# Patient Record
Sex: Female | Born: 1970 | Race: White | Hispanic: No | Marital: Married | Smoking: Never smoker
Health system: Southern US, Community
[De-identification: ages and names within clinical notes are randomized; demographics above are authoritative.]

## PROBLEM LIST (undated history)

## (undated) DIAGNOSIS — E119 Type 2 diabetes mellitus without complications: Secondary | ICD-10-CM

## (undated) DIAGNOSIS — M2550 Pain in unspecified joint: Secondary | ICD-10-CM

## (undated) DIAGNOSIS — C801 Malignant (primary) neoplasm, unspecified: Secondary | ICD-10-CM

## (undated) DIAGNOSIS — E739 Lactose intolerance, unspecified: Secondary | ICD-10-CM

## (undated) DIAGNOSIS — M1990 Unspecified osteoarthritis, unspecified site: Secondary | ICD-10-CM

## (undated) DIAGNOSIS — I639 Cerebral infarction, unspecified: Secondary | ICD-10-CM

## (undated) DIAGNOSIS — F329 Major depressive disorder, single episode, unspecified: Secondary | ICD-10-CM

## (undated) DIAGNOSIS — M329 Systemic lupus erythematosus, unspecified: Secondary | ICD-10-CM

## (undated) DIAGNOSIS — R600 Localized edema: Secondary | ICD-10-CM

## (undated) DIAGNOSIS — F32A Depression, unspecified: Secondary | ICD-10-CM

## (undated) DIAGNOSIS — F419 Anxiety disorder, unspecified: Secondary | ICD-10-CM

## (undated) DIAGNOSIS — G4730 Sleep apnea, unspecified: Secondary | ICD-10-CM

## (undated) DIAGNOSIS — I10 Essential (primary) hypertension: Secondary | ICD-10-CM

## (undated) DIAGNOSIS — D649 Anemia, unspecified: Secondary | ICD-10-CM

## (undated) DIAGNOSIS — IMO0002 Reserved for concepts with insufficient information to code with codable children: Secondary | ICD-10-CM

## (undated) DIAGNOSIS — K219 Gastro-esophageal reflux disease without esophagitis: Secondary | ICD-10-CM

## (undated) DIAGNOSIS — K5900 Constipation, unspecified: Secondary | ICD-10-CM

## (undated) HISTORY — PX: ABDOMINAL HYSTERECTOMY: SHX81

## (undated) HISTORY — DX: Lactose intolerance, unspecified: E73.9

## (undated) HISTORY — DX: Gastro-esophageal reflux disease without esophagitis: K21.9

## (undated) HISTORY — PX: WISDOM TOOTH EXTRACTION: SHX21

## (undated) HISTORY — PX: CHOLECYSTECTOMY: SHX55

## (undated) HISTORY — DX: Sleep apnea, unspecified: G47.30

## (undated) HISTORY — DX: Anemia, unspecified: D64.9

## (undated) HISTORY — DX: Localized edema: R60.0

## (undated) HISTORY — DX: Pain in unspecified joint: M25.50

## (undated) HISTORY — DX: Unspecified osteoarthritis, unspecified site: M19.90

## (undated) HISTORY — DX: Depression, unspecified: F32.A

## (undated) HISTORY — DX: Constipation, unspecified: K59.00

---
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
## 2005-01-01 ENCOUNTER — Emergency Department (HOSPITAL_COMMUNITY): Admission: EM | Admit: 2005-01-01 | Discharge: 2005-01-01 | Payer: Self-pay | Admitting: Emergency Medicine

## 2005-01-01 ENCOUNTER — Ambulatory Visit: Payer: Self-pay | Admitting: Internal Medicine

## 2005-01-01 ENCOUNTER — Encounter: Payer: Self-pay | Admitting: Cardiology

## 2005-10-26 ENCOUNTER — Ambulatory Visit: Payer: Self-pay

## 2007-04-08 ENCOUNTER — Other Ambulatory Visit: Payer: Self-pay

## 2007-04-08 ENCOUNTER — Emergency Department: Payer: Self-pay | Admitting: Emergency Medicine

## 2007-05-19 ENCOUNTER — Encounter (INDEPENDENT_AMBULATORY_CARE_PROVIDER_SITE_OTHER): Payer: Self-pay | Admitting: Gynecology

## 2007-05-19 ENCOUNTER — Ambulatory Visit: Payer: Self-pay | Admitting: Gynecology

## 2008-04-25 ENCOUNTER — Emergency Department: Payer: Self-pay | Admitting: Emergency Medicine

## 2010-06-26 ENCOUNTER — Emergency Department (HOSPITAL_COMMUNITY): Admission: EM | Admit: 2010-06-26 | Discharge: 2010-06-26 | Payer: Self-pay | Admitting: Emergency Medicine

## 2011-05-04 NOTE — Consult Note (Signed)
[name], [name]                 ACCOUNT NO.:  [id number]   MEDICAL RECORD NO.:  [id number]          PATIENT TYPE:  EMS   LOCATION:  MAJO                         FACILITY:  [facility]   PHYSICIAN:  Theodore Demark, P.A. LHCDATE OF BIRTH:  [birth year]   DATE OF CONSULTATION:  01/01/2005  DATE OF DISCHARGE:                                   CONSULTATION   PRIMARY CARE PHYSICIAN:  Dr. Welton Flakes at [facility].   PRIMARY CARDIOLOGIST:  Arvilla Meres, M.D.   CHIEF COMPLAINT:  Palpitations, fatigue, chest pain.   HISTORY OF PRESENT ILLNESS:  [name] is a 40 year old female with  approximately one-year history of palpitations.  They are intermittent.  They have been occurring more frequently recently.  She is aware of them  during the day and they have awakened her twice at night.  Today she felt  sluggish and tired secondary to palpitations.  By pulse ox her heart rate  was decreasing into the 30s at times.  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   Bigeminy was seen on telemetry.  [name] denies long runs of palpitations  but says that very frequent skipping causes symptoms.  She has no history of  syncope or presyncope.  She gets chest pain with palpitations that are 3 or  4/10.  Chest pain does not change with position, or movement or palpation of  her chest.  She did not have chest pain when the palpitations initially  started but the chest pain started with them about three days ago.  The  chest pain is intermittent but she is having it at the time of exam.  She  denies shortness of breath, nausea, vomiting or diaphoresis.   PAST MEDICAL HISTORY:  She had borderline gestational diabetes.  She also  had a systolic murmur during pregnancy but says no one has mentioned it to  her since then.  She has no history of hypertension or hyperlipidemia.  History of anemia about five years.   PAST SURGICAL HISTORY:   She is status post cholecystectomy, cesarean section  and tubal ligation.   MEDICATIONS:  She takes Prilosec OTC.  She has not recently taken any other  over-the-counter medications and is on no prescriptions.  Four years ago she  took phentermine to lose weight.  She did lose about 45 pounds but regained  it.   ALLERGIES:  No known drug allergies.   SOCIAL HISTORY:  She lives in [place] with her husband.  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  She drinks about three 8 ounce  Pepsis a day.  She denies alcohol or drug abuse.   FAMILY HISTORY:  Her mother is alive at age 2.  Her father is alive at age  59. Neither one has heart disease.  She has no siblings with heart disease,  heart rhythm problems or any other difficulties.  Her aunt is alive at age  78 and has had multiple cardiac surgeries since she was  a child, most likely  secondary to some sort of birth defects.   REVIEW OF SYSTEMS:  Significant for mild chronic dyspnea on exertion that  has not recently changed.  She has not been ill recently, no fevers or  chills.  She complains of some symptoms of depression and anxiety and was on  Wellbutrin at one time for that but this has been stopped.  She has  significant arthralgias in both of her feet but has not sought treatment for  this.  She has occasional reflux symptoms for which she takes Prilosec OTC.   PHYSICAL EXAMINATION:  VITAL SIGNS:  Temperature is 97.9, blood pressure  135/90, heart rate 82, respiratory rate 18, oxygen saturations 96% on room  air.  GENERAL:  She is a well developed, slightly obese white female in no acute  distress.  HEENT:  Head is normocephalic and atraumatic.  Pupils equal, round and  reactive to light and accommodation.  Extraocular movements are intact.  Sclerae are clear.  Nares without discharge.  NECK:  Supple.  There is no lymphadenopathy, bruit or jugular venous  distention.  There is question of mild thyromegaly  (ultrasound pending).  CARDIOVASCULAR:  Heart is regular in rate and rhythm with an S1, S2 and a  1/2 systolic ejection murmur at the left lower sternal border.  Her distal  pulses are 2+ and no femoral bruits are appreciated.  LUNGS:  Clear to auscultation bilaterally.  SKIN:  No rashes or lesions are noted.  ABDOMEN:  Soft and nontender with active bowel sounds and no  hepatosplenomegaly by exam.  EXTREMITIES:  There is no cyanosis, clubbing or edema.  MUSCULOSKELETAL:  There is no joint deformity or effusions and no spine or  CVA tenderness.  NEURO:  She is alert and oriented with cranial nerves 2 through 12 grossly  intact.   EKG:  Sinus rhythm, rate 69 with bigeminy PVCs.  Laboratory values are  pending.   ASSESSMENT/PLAN:  Palpitations. This is a 40 year old female with  progressive symptomatic PVCs.  Suspect these are benign.  Will check  electrolytes, TSH as well as echo and a thyroid ultrasound. If these are  normal, she can be discharged home with a 48 hour Holter and a prescription  for Toprol XL 25 mg.  She needs follow-up visit in the office for an  exercise Cardiolite.       RB/MEDQ  D:  01/01/2005  T:  01/01/2005  Job:  [id number]

## 2012-05-01 ENCOUNTER — Ambulatory Visit: Payer: Self-pay | Admitting: Family Medicine

## 2012-05-13 ENCOUNTER — Ambulatory Visit: Payer: Self-pay | Admitting: Family Medicine

## 2012-10-26 ENCOUNTER — Emergency Department (HOSPITAL_COMMUNITY): Payer: Self-pay

## 2012-10-26 ENCOUNTER — Encounter (HOSPITAL_COMMUNITY): Payer: Self-pay

## 2012-10-26 ENCOUNTER — Emergency Department (HOSPITAL_COMMUNITY)
Admission: EM | Admit: 2012-10-26 | Discharge: 2012-10-26 | Disposition: A | Discharge status: Home / Self Care | Payer: Self-pay | Attending: Emergency Medicine | Admitting: Emergency Medicine

## 2012-10-26 DIAGNOSIS — Z90710 Acquired absence of both cervix and uterus: Secondary | ICD-10-CM | POA: Insufficient documentation

## 2012-10-26 DIAGNOSIS — Z9889 Other specified postprocedural states: Secondary | ICD-10-CM | POA: Insufficient documentation

## 2012-10-26 DIAGNOSIS — R1012 Left upper quadrant pain: Secondary | ICD-10-CM | POA: Insufficient documentation

## 2012-10-26 DIAGNOSIS — M329 Systemic lupus erythematosus, unspecified: Secondary | ICD-10-CM | POA: Insufficient documentation

## 2012-10-26 DIAGNOSIS — R109 Unspecified abdominal pain: Secondary | ICD-10-CM

## 2012-10-26 DIAGNOSIS — Z79899 Other long term (current) drug therapy: Secondary | ICD-10-CM | POA: Insufficient documentation

## 2012-10-26 HISTORY — DX: Reserved for concepts with insufficient information to code with codable children: IMO0002

## 2012-10-26 HISTORY — DX: Systemic lupus erythematosus, unspecified: M32.9

## 2012-10-26 LAB — URINALYSIS, ROUTINE W REFLEX MICROSCOPIC
Glucose, UA: NEGATIVE mg/dL
Leukocytes, UA: NEGATIVE
pH: 5.5 (ref 5.0–8.0)

## 2012-10-26 LAB — POCT I-STAT, CHEM 8
BUN: 15 mg/dL (ref 6–23)
Chloride: 103 mEq/L (ref 96–112)
Potassium: 3.6 mEq/L (ref 3.5–5.1)
Sodium: 140 mEq/L (ref 135–145)
TCO2: 25 mmol/L (ref 0–100)

## 2012-10-26 MED ORDER — IBUPROFEN 400 MG PO TABS
600.0000 mg | ORAL_TABLET | Freq: Once | ORAL | Status: AC
  Administered 2012-10-26: 600 mg via ORAL
  Filled 2012-10-26: qty 1

## 2012-10-26 MED ORDER — IBUPROFEN 600 MG PO TABS: 600.0000 mg | ORAL_TABLET | Freq: Three times a day (TID) | ORAL | Status: DC | PRN

## 2012-10-26 NOTE — ED Provider Notes (Signed)
History     CSN: [id number]  Arrival date & time 10/26/12  4098   First MD Initiated Contact with Patient 10/26/12 [phone]      Chief Complaint  Patient presents with  . Abdominal Pain     The history is provided by the patient and medical records.   patient reports awakening with intermittent sharp left upper quadrant abdominal pain.  No nausea vomiting or diarrhea.  No melena or hematochezia.  She denies dysuria or urinary frequency.  She's never had these symptoms before.  She states her symptoms are intermittent.  History kidney stones.  She does report laughing a lot yesterday and does report some tenderness on the left lateral chest wall and left lateral lower rib.  She denies cough or shortness of breath.  No fevers or chills.  She's had an abdominal hysterectomy and a cholecystectomy.  She otherwise has a history of lupus and no other medical problems.  No history of PE or DVT  Past Medical History  Diagnosis Date  . Lupus     Past Surgical History  Procedure Date  . Abdominal hysterectomy   . Cholecystectomy   . Wisdom tooth extraction     No family history on file.  History  Substance Use Topics  . Smoking status: Never Smoker   . Smokeless tobacco: Not on file  . Alcohol Use: Yes    OB History    Grav Para Term Preterm Abortions TAB SAB Ect Mult Living                  Review of Systems  Gastrointestinal: Positive for abdominal pain.  All other systems reviewed and are negative.    Allergies  Review of patient's allergies indicates no known allergies.  Home Medications   Current Outpatient Rx  Name  Route  Sig  Dispense  Refill  . ESOMEPRAZOLE MAGNESIUM 40 MG PO CPDR   Oral   Take 40 mg by mouth daily before breakfast.           BP 149/94  Pulse 77  Temp 98.7 F (37.1 C) (Oral)  Resp 18  SpO2 97%  Physical Exam  Nursing note and vitals reviewed. Constitutional: She is oriented to person, place, and time. She appears well-developed and  well-nourished. No distress.  HENT:  Head: Normocephalic and atraumatic.  Eyes: EOM are normal.  Neck: Normal range of motion.  Cardiovascular: Normal rate, regular rhythm and normal heart sounds.   Pulmonary/Chest: Effort normal and breath sounds normal.       Mild tenderness of left lateral chest wall as well as left upper quadrant abdomen.  Abdominal: Soft. She exhibits no distension.       Mild tenderness of the left upper quadrant without guarding or rebound  Genitourinary:       Mild left CVA tenderness  Musculoskeletal: Normal range of motion.  Neurological: She is alert and oriented to person, place, and time.  Skin: Skin is warm and dry.  Psychiatric: She has a normal mood and affect. Judgment normal.    ED Course  Procedures (including critical care time)  Labs Reviewed  URINALYSIS, ROUTINE W REFLEX MICROSCOPIC - Abnormal; Notable for the following:    APPearance HAZY (*)     All other components within normal limits  POCT I-STAT, CHEM 8 - Abnormal; Notable for the following:    Glucose, Bld 109 (*)     All other components within normal limits   Ct Abdomen Pelvis Wo  Contrast  10/26/2012  *RADIOLOGY REPORT*  Clinical Data: Left upper quadrant pain  CT ABDOMEN AND PELVIS WITHOUT CONTRAST  Technique:  Multidetector CT imaging of the abdomen and pelvis was performed following the standard protocol without intravenous contrast.  Comparison: None.  Findings: Sagittal images of the spine shows significant disc space flattening at L4-L5 level.  Lung bases are unremarkable. Unenhanced liver shows no biliary ductal dilatation.  Small hiatal hernia measures 3.9 x 2.9 cm.  Status post cholecystectomy.  Unenhanced pancreas, spleen and adrenal glands are unremarkable.  Tiny accessory splenule.  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  No hydronephrosis or hydroureter.  No nephrolithiasis.  No calcified ureteral calculi are noted.  No urinary bladder calculi are noted.  The patient is  status post hysterectomy.  No small bowel obstruction.  No ascites or free air.  There is no pericecal inflammation.  No adenopathy.  Normal appendix is partially visualized in axial image 57.  No destructive bony lesions are noted within pelvis.  IMPRESSION:  1.  No nephrolithiasis. No hydronephrosis or hydroureter. 2. Horseshoe kidney.   3.  Small hiatal hernia. 4.  No calcified ureteral calculi are noted. 5.  Status post cholecystectomy.  Status post hysterectomy.   Original Report Authenticated By: Natasha Mead, M.D.    I personally reviewed the imaging tests through PACS system I reviewed available ER/hospitalization records through the EMR   1. Flank pain       MDM  10:38 AM The patient feels somewhat better at this time.  Urine normal.  CT scan without evidence of ureterolithiasis or alternative diagnosis for her left flank pain.  The fact that is worse with movement and palpation makes me think it may be musculoskeletal pain.  Home with anti-inflammatories.  Vital signs normal.  Doubt pulmonary embolism.        Lyanne Co, MD 10/26/12 [phone]

## 2012-10-26 NOTE — ED Notes (Signed)
Pt undressed, in gown, on continuous pulse oximetry and blood pressure cuff; warm blankets given 

## 2012-10-26 NOTE — ED Notes (Signed)
Pt. Woke up with  Sharp lt. Upper abdominal pain.  Denies any injuries.  Denies any n/v/d

## 2012-12-17 LAB — HM MAMMOGRAPHY: HM MAMMO: NORMAL

## 2012-12-17 LAB — HM PAP SMEAR: HM Pap smear: NORMAL

## 2013-08-05 ENCOUNTER — Ambulatory Visit: Payer: Self-pay

## 2013-11-16 ENCOUNTER — Emergency Department (HOSPITAL_COMMUNITY): Payer: Self-pay

## 2013-11-16 ENCOUNTER — Emergency Department (HOSPITAL_COMMUNITY)
Admission: EM | Admit: 2013-11-16 | Discharge: 2013-11-16 | Disposition: A | Discharge status: Home / Self Care | Payer: Self-pay | Attending: Emergency Medicine | Admitting: Emergency Medicine

## 2013-11-16 ENCOUNTER — Encounter (HOSPITAL_COMMUNITY): Payer: Self-pay | Admitting: Emergency Medicine

## 2013-11-16 DIAGNOSIS — M546 Pain in thoracic spine: Secondary | ICD-10-CM | POA: Insufficient documentation

## 2013-11-16 DIAGNOSIS — M25519 Pain in unspecified shoulder: Secondary | ICD-10-CM | POA: Insufficient documentation

## 2013-11-16 DIAGNOSIS — K219 Gastro-esophageal reflux disease without esophagitis: Secondary | ICD-10-CM | POA: Insufficient documentation

## 2013-11-16 DIAGNOSIS — Z8739 Personal history of other diseases of the musculoskeletal system and connective tissue: Secondary | ICD-10-CM | POA: Insufficient documentation

## 2013-11-16 DIAGNOSIS — R110 Nausea: Secondary | ICD-10-CM | POA: Insufficient documentation

## 2013-11-16 DIAGNOSIS — I10 Essential (primary) hypertension: Secondary | ICD-10-CM | POA: Insufficient documentation

## 2013-11-16 DIAGNOSIS — R609 Edema, unspecified: Secondary | ICD-10-CM | POA: Insufficient documentation

## 2013-11-16 DIAGNOSIS — Z79899 Other long term (current) drug therapy: Secondary | ICD-10-CM | POA: Insufficient documentation

## 2013-11-16 DIAGNOSIS — F411 Generalized anxiety disorder: Secondary | ICD-10-CM | POA: Insufficient documentation

## 2013-11-16 DIAGNOSIS — M549 Dorsalgia, unspecified: Secondary | ICD-10-CM

## 2013-11-16 LAB — COMPREHENSIVE METABOLIC PANEL
ALT: 19 U/L (ref 0–35)
AST: 17 U/L (ref 0–37)
Albumin: 3.7 g/dL (ref 3.5–5.2)
Alkaline Phosphatase: 58 U/L (ref 39–117)
BUN: 15 mg/dL (ref 6–23)
CO2: 26 mEq/L (ref 19–32)
Calcium: 9.1 mg/dL (ref 8.4–10.5)
Chloride: 98 mEq/L (ref 96–112)
Creatinine, Ser: 0.71 mg/dL (ref 0.50–1.10)
GFR calc Af Amer: >90 mL/min (ref >90)
GFR calc non Af Amer: >90 mL/min (ref >90)
Glucose, Bld: 117 mg/dL — ABNORMAL HIGH (ref 70–99)
Potassium: 3.3 mEq/L — ABNORMAL LOW (ref 3.5–5.1)
Sodium: 137 mEq/L (ref 135–145)
Total Bilirubin: 0.3 mg/dL (ref 0.3–1.2)
Total Protein: 7.4 g/dL (ref 6.0–8.3)

## 2013-11-16 LAB — CBC WITH DIFFERENTIAL/PLATELET
Eosinophils Relative: 2 % (ref 0–5)
HCT: 37.5 % (ref 36.0–46.0)
Lymphocytes Relative: 27 % (ref 12–46)
Lymphs Abs: 2.2 10*3/uL (ref 0.7–4.0)
MCV: 81.7 fL (ref 78.0–100.0)
Platelets: 236 10*3/uL (ref 150–400)
RBC: 4.59 MIL/uL (ref 3.87–5.11)
WBC: 8.2 10*3/uL (ref 4.0–10.5)

## 2013-11-16 LAB — PRO B NATRIURETIC PEPTIDE: Pro B Natriuretic peptide (BNP): 7.5 pg/mL (ref 0–125)

## 2013-11-16 LAB — POCT I-STAT TROPONIN I

## 2013-11-16 MED ORDER — POTASSIUM CHLORIDE CRYS ER 20 MEQ PO TBCR
40.0000 meq | EXTENDED_RELEASE_TABLET | Freq: Once | ORAL | Status: AC
  Administered 2013-11-16: 40 meq via ORAL
  Filled 2013-11-16: qty 2

## 2013-11-16 NOTE — ED Notes (Signed)
Pt states that she started having pain a few hours ago at home. Pt states that the pain is between her shoulder blades and wraps around to her chest intermittantly. Pt states 5/10 now but earlier was a 10/10. Pt nauseated and burping, gallbladder removed. Pt suppose to have ultrasound on heart due to fluid build up but miss appointment.

## 2013-11-16 NOTE — ED Notes (Signed)
Dr. Wickline at the bedside.  

## 2013-11-16 NOTE — ED Notes (Signed)
Pt states that her pain is not longer in her back, but more in her left upper chest near clavicle area. Pt states uncomfortable, pt offered pain medication but does not want any right now. Pt told to call if she changes her mind.

## 2013-11-16 NOTE — ED Notes (Signed)
Cardiac marker due at [facility]

## 2013-11-16 NOTE — ED Provider Notes (Signed)
CSN: [id number]     Arrival date & time 11/16/13  2725 History   First MD Initiated Contact with Patient 11/16/13 [phone]     Chief Complaint  Patient presents with  . Back Pain   (Consider location/radiation/quality/duration/timing/severity/associated sxs/prior Treatment) HPI 42 year old female presents to the emergency apartment with complaint of pain in between the shoulder blades this morning around 3 AM.  Patient reports waking with pain down her right arm, and some numbness around 3 AM.  Patient then had onset of back pain that lasted about an hour.  Pain was sharp.  With that she became nauseated and felt hot.  Patient reports over the last few weeks to months she has had shortness of breath waking her up in the night with cough.  She has had swelling in her lower extremities.  Her doctor recently increased her hydrochlorothiazide from 12.5-25 last week.  Patient reports that she had an echo scheduled, but has not yet been able to have it completed.  PMH of lupus, htn, GERD.  No personal h/o CAD, reports GF had MI at 56.   Past Medical History  Diagnosis Date  . Lupus    Past Surgical History  Procedure Laterality Date  . Abdominal hysterectomy    . Cholecystectomy    . Wisdom tooth extraction     History reviewed. No pertinent family history. History  Substance Use Topics  . Smoking status: Never Smoker   . Smokeless tobacco: Not on file  . Alcohol Use: Yes   OB History   Grav Para Term Preterm Abortions TAB SAB Ect Mult Living                 Review of Systems  All other systems reviewed and are negative.    Allergies  Review of patient's allergies indicates no known allergies.  Home Medications   Current Outpatient Rx  Name  Route  Sig  Dispense  Refill  . esomeprazole (NEXIUM) 40 MG capsule   Oral   Take 40 mg by mouth daily before breakfast.         . hydrochlorothiazide (HYDRODIURIL) 25 MG tablet   Oral   Take 25 mg by mouth daily.          BP 146/88   Temp(Src) 98.4 F (36.9 C) (Oral)  Resp 18  SpO2 99% Physical Exam  Nursing note and vitals reviewed. Constitutional: She is oriented to person, place, and time. She appears well-developed and well-nourished. She appears distressed.  HENT:  Head: Normocephalic and atraumatic.  Right Ear: External ear normal.  Left Ear: External ear normal.  Nose: Nose normal.  Mouth/Throat: Oropharynx is clear and moist.  Eyes: Conjunctivae and EOM are normal. Pupils are equal, round, and reactive to light.  Neck: Normal range of motion. Neck supple. No JVD present. No tracheal deviation present. No thyromegaly present.  Cardiovascular: Normal rate, regular rhythm, normal heart sounds and intact distal pulses.  Exam reveals no gallop and no friction rub.   No murmur (anxious appearing) heard. Pulmonary/Chest: Effort normal and breath sounds normal. No stridor. No respiratory distress. She has no wheezes. She has no rales. She exhibits no tenderness.  Abdominal: Soft. Bowel sounds are normal. She exhibits no distension and no mass. There is no tenderness. There is no rebound and no guarding.  Musculoskeletal: Normal range of motion. She exhibits edema. She exhibits no tenderness.  Lymphadenopathy:    She has no cervical adenopathy.  Neurological: She is alert and oriented to  person, place, and time. She has normal reflexes. No cranial nerve deficit. She exhibits normal muscle tone. Coordination normal.  Skin: Skin is warm and dry. No rash noted. No erythema. No pallor.  Psychiatric: She has a normal mood and affect. Her behavior is normal. Judgment and thought content normal.    ED Course  Procedures (including critical care time) Labs Review Labs Reviewed  COMPREHENSIVE METABOLIC PANEL - Abnormal; Notable for the following:    Potassium 3.3 (*)    Glucose, Bld 117 (*)    All other components within normal limits  CBC WITH DIFFERENTIAL  PRO B NATRIURETIC PEPTIDE  POCT I-STAT TROPONIN I    Imaging Review Dg Chest 2 View  11/16/2013   *RADIOLOGY REPORT*  Clinical Data: Chest pain  CHEST - 2 VIEW  Comparison: 10/26/2012 abdominal CT  Findings: Cardiomediastinal contours within normal range.  The patient has a known small hiatal hernia from prior abdominal CT. No consolidation, pleural effusion, pneumothorax.  Surgical clips right upper quadrant.  No acute osseous finding.  IMPRESSION: No radiographic evidence of acute cardiopulmonary process.   Original Report Authenticated By: Jearld Lesch, M.D.    EKG Interpretation    Date/Time:  Monday November 16 2013 04:24:13 EST Ventricular Rate:  64 PR Interval:  194 QRS Duration: 92 QT Interval:  432 QTC Calculation: 446 R Axis:   -47 Text Interpretation:  Sinus rhythm Left anterior fascicular block Low voltage, precordial leads Probable anteroseptal infarct, old No old tracing to compare Confirmed by Alfie Rideaux  MD, Orlando Devereux ([id number]) on 11/16/2013 4:42:54 AM            MDM   1. Upper back pain    42 yo female with upper back pain this morning, concerned about possible CHF.  Will get cxr, labs, plan for delta trop.      Olivia Mackie, MD 11/16/13 [phone]

## 2013-11-16 NOTE — ED Provider Notes (Signed)
EKG Interpretation    Date/Time:  Monday November 16 2013 07:13:13 EST Ventricular Rate:  72 PR Interval:  174 QRS Duration: 89 QT Interval:  462 QTC Calculation: 506 R Axis:   -33 Text Interpretation:  Sinus rhythm Left axis deviation Low voltage, precordial leads Borderline T abnormalities, anterior leads Borderline prolonged QT interval No significant change since last tracing Confirmed by Bebe Shaggy  MD, Quantavious Eggert [phone]) on 11/16/2013 7:17:53 AM            Pt improved, resting comfortably She appears PERC negative Repeat EKG/troponin negative Low suspicion for ACS/PE/Dissection It is reported on chart h/o lupus but she has no further details, reports this improved "after hysterectomy" I feel she is safe/stable for d/c home   Joya Gaskins, MD 11/16/13 [phone]

## 2014-11-23 LAB — CBC AND DIFFERENTIAL
HCT: 38 % (ref 36–46)
Hemoglobin: 12.6 g/dL (ref 12.0–16.0)
WBC: 7.2 10^3/mL

## 2015-02-07 ENCOUNTER — Ambulatory Visit (INDEPENDENT_AMBULATORY_CARE_PROVIDER_SITE_OTHER): Payer: No Typology Code available for payment source | Admitting: Nurse Practitioner

## 2015-02-07 ENCOUNTER — Encounter: Payer: Self-pay | Admitting: Nurse Practitioner

## 2015-02-07 ENCOUNTER — Encounter (INDEPENDENT_AMBULATORY_CARE_PROVIDER_SITE_OTHER): Payer: Self-pay

## 2015-02-07 VITALS — BP 142/80 | HR 62 | Temp 97.4°F | Resp 14 | Ht 61.5 in | Wt 241.4 lb

## 2015-02-07 DIAGNOSIS — Z7189 Other specified counseling: Secondary | ICD-10-CM

## 2015-02-07 DIAGNOSIS — F411 Generalized anxiety disorder: Secondary | ICD-10-CM

## 2015-02-07 DIAGNOSIS — Z7689 Persons encountering health services in other specified circumstances: Secondary | ICD-10-CM | POA: Insufficient documentation

## 2015-02-07 MED ORDER — PHENTERMINE HCL 37.5 MG PO CAPS: 37.5000 mg | ORAL_CAPSULE | ORAL | Status: DC

## 2015-02-07 MED ORDER — HYDROCHLOROTHIAZIDE 25 MG PO TABS: 25.0000 mg | ORAL_TABLET | Freq: Every day | ORAL | Status: DC

## 2015-02-07 MED ORDER — ALPRAZOLAM 1 MG PO TABS: 1.0000 mg | ORAL_TABLET | Freq: Every evening | ORAL | Status: DC | PRN

## 2015-02-07 NOTE — Assessment & Plan Note (Signed)
CSC signed today. Discussed options and settled on Xanax to help with anxiety episodes. UDS performed and script was given. Will follow up in 1 month.

## 2015-02-07 NOTE — Assessment & Plan Note (Signed)
Discussed acute and chronic issues. Reviewed health maintenance measures, PFSHx, and immunizations. Labs performed, will obtain past medical records from other facility.

## 2015-02-07 NOTE — Patient Instructions (Signed)
Follow up in 1 month for recheck of weight and Blood pressure.   Please visit the lab before leaving today.

## 2015-02-07 NOTE — Progress Notes (Signed)
Subjective:    Patient ID: Natalie Greene, female    DOB: 09/05/1971, 44 y.o.   MRN: [id number]  HPI  Natalie Greene is a 44 yo female establishing care and CC of anxiety and weight loss.   1) New Pt info:   Diet- Biscuit every morning, likes chicken and green beans, denies eating a lot of sweets   Exercise- Has a sedentary job, no formal currently   Immunizations- Up to date  Mammogram- Due for one   Pap- 2014  Eye Exam- Up to date  Dental Exam- Up to date   2) Acute Problems-  A)Took phentermine 14 years ago lost 40 lbs in 3 months.  Goal to lose weight in 20 lbs at a time Goal for exercise to walk or bike 2 miles each day    B) Anxiety- Restless, "feels always on edge", son is blind and she works 2 jobs. Tried xanax in past. Tried SSRIs and feels they did not work.   Lab work was last performed 3-4 months ago.  Has not taken HCTZ in awhile due to no refills left.    Review of Systems  Constitutional: Positive for appetite change. Negative for fever, chills, diaphoresis, fatigue and unexpected weight change.       Weight gain, no current formal diet or exercise  Respiratory: Negative for chest tightness, shortness of breath and wheezing.   Cardiovascular: Negative for chest pain, palpitations and leg swelling.  Gastrointestinal: Negative for nausea, vomiting and diarrhea.  Skin: Negative for rash.  Neurological: Negative for dizziness, weakness, numbness and headaches.  Psychiatric/Behavioral: The patient is nervous/anxious.    Past Medical History  Diagnosis Date  . Lupus   . GERD (gastroesophageal reflux disease)     History   Social History  . Marital Status: Single    Spouse Name: N/A  . Number of Children: N/A  . Years of Education: N/A   Occupational History  . Not on file.   Social History Main Topics  . Smoking status: Never Smoker   . Smokeless tobacco: Not on file  . Alcohol Use: No  . Drug Use: No  . Sexual Activity:    Partners: Male   Other  Topics Concern  . 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 Abdominal hysterectomy    . Cholecystectomy    . Wisdom tooth extraction      Family History  Problem Relation Age of Onset  . Cancer Father     colon and prostate  . Diabetes Father     No Known Allergies  Current Outpatient Prescriptions on File Prior to Visit  Medication Sig Dispense Refill  . esomeprazole (NEXIUM) 40 MG capsule Take 40 mg by mouth daily before breakfast.     No current facility-administered medications on file prior to visit.      Objective:   Physical Exam  Constitutional: She is oriented to person, place, and time. She appears well-developed and well-nourished. No distress.  BP 142/80 mmHg  Pulse 62  Temp(Src) 97.4 F (36.3 C) (Oral)  Resp 14  Ht 5' 1.5" (1.562 m)  Wt 241 lb 6.4 oz (109.498 kg)  BMI 44.88 kg/m2  SpO2 97% Repeat BP was 138/86   HENT:  Head: Normocephalic and atraumatic.  Right Ear: External ear normal.  Left Ear: External ear normal.  Cardiovascular: Normal rate, regular rhythm, normal heart sounds and intact distal pulses.  Exam reveals no gallop and no friction rub.   No murmur heard. Pulmonary/Chest: Effort normal and breath sounds normal. No respiratory distress. She has no wheezes. She has no rales. She exhibits no tenderness.  Abdominal:  Obese  Neurological: She is alert and oriented to person, place, and time. No cranial nerve deficit. She exhibits normal muscle tone. Coordination normal.  Skin: Skin is warm and dry. No rash noted. She is not diaphoretic.  Psychiatric: She has a normal mood and affect. Her behavior is normal. Judgment and  thought content normal.      Assessment & Plan:

## 2015-02-07 NOTE — Progress Notes (Signed)
Pre visit review using our clinic review tool, if applicable. No additional management support is needed unless otherwise documented below in the visit note. 

## 2015-02-07 NOTE — Assessment & Plan Note (Addendum)
Worsening. Phentermine was used in past with success. Pt would like to try it again. BP controlled with HCTZ, refilled so she can get back on it. Phentermine 37.5 mg daily script given to pt to take to pharmacy (along with xanax). Will follow up in 1 month for weight and BP check.   Pt has goals for diet and exercise, low GI diet handout given to pt.

## 2015-02-28 ENCOUNTER — Other Ambulatory Visit: Payer: Self-pay | Admitting: *Deleted

## 2015-02-28 MED ORDER — ESOMEPRAZOLE MAGNESIUM 40 MG PO CPDR: 40.0000 mg | DELAYED_RELEASE_CAPSULE | Freq: Every day | ORAL | Status: DC

## 2015-02-28 NOTE — Telephone Encounter (Signed)
Pt called, needing refill on Nexium. Rx sent to pharmacy by escript

## 2015-03-07 ENCOUNTER — Encounter: Payer: Self-pay | Admitting: Nurse Practitioner

## 2015-03-07 ENCOUNTER — Ambulatory Visit (INDEPENDENT_AMBULATORY_CARE_PROVIDER_SITE_OTHER): Payer: No Typology Code available for payment source | Admitting: Nurse Practitioner

## 2015-03-07 VITALS — BP 122/86 | HR 72 | Resp 12 | Ht 61.5 in | Wt 229.2 lb

## 2015-03-07 DIAGNOSIS — Z130 Encounter for screening for diseases of the blood and blood-forming organs and certain disorders involving the immune mechanism: Secondary | ICD-10-CM

## 2015-03-07 DIAGNOSIS — Z1329 Encounter for screening for other suspected endocrine disorder: Secondary | ICD-10-CM

## 2015-03-07 DIAGNOSIS — F411 Generalized anxiety disorder: Secondary | ICD-10-CM

## 2015-03-07 DIAGNOSIS — Z13220 Encounter for screening for lipoid disorders: Secondary | ICD-10-CM

## 2015-03-07 LAB — CBC WITH DIFFERENTIAL/PLATELET
BASOS ABS: 0 10*3/uL (ref 0.0–0.1)
Basophils Relative: 0.6 % (ref 0.0–3.0)
Eosinophils Absolute: 0.1 10*3/uL (ref 0.0–0.7)
Eosinophils Relative: 1.9 % (ref 0.0–5.0)
HEMATOCRIT: 40.5 % (ref 36.0–46.0)
Hemoglobin: 13.6 g/dL (ref 12.0–15.0)
LYMPHS ABS: 1.5 10*3/uL (ref 0.7–4.0)
Lymphocytes Relative: 20.6 % (ref 12.0–46.0)
MCHC: 33.5 g/dL (ref 30.0–36.0)
MCV: 82.7 fl (ref 78.0–100.0)
Monocytes Absolute: 0.5 10*3/uL (ref 0.1–1.0)
Monocytes Relative: 6.6 % (ref 3.0–12.0)
Neutro Abs: 5.3 10*3/uL (ref 1.4–7.7)
Neutrophils Relative %: 70.3 % (ref 43.0–77.0)
PLATELETS: 240 10*3/uL (ref 150.0–400.0)
RBC: 4.9 Mil/uL (ref 3.87–5.11)
RDW: 13.8 % (ref 11.5–15.5)
WBC: 7.5 10*3/uL (ref 4.0–10.5)

## 2015-03-07 LAB — COMPREHENSIVE METABOLIC PANEL
ALBUMIN: 4.2 g/dL (ref 3.5–5.2)
ALK PHOS: 57 U/L (ref 39–117)
ALT: 70 U/L — ABNORMAL HIGH (ref 0–35)
AST: 42 U/L — AB (ref 0–37)
BUN: 13 mg/dL (ref 6–23)
CHLORIDE: 99 meq/L (ref 96–112)
CO2: 29 mEq/L (ref 19–32)
CREATININE: 0.79 mg/dL (ref 0.40–1.20)
Calcium: 9.4 mg/dL (ref 8.4–10.5)
GFR: 84.13 mL/min (ref >60.00)
Glucose, Bld: 134 mg/dL — ABNORMAL HIGH (ref 70–99)
POTASSIUM: 3.4 meq/L — AB (ref 3.5–5.1)
Sodium: 137 mEq/L (ref 135–145)
Total Bilirubin: 0.6 mg/dL (ref 0.2–1.2)
Total Protein: 7.6 g/dL (ref 6.0–8.3)

## 2015-03-07 LAB — HEMOGLOBIN A1C: Hgb A1c MFr Bld: 7.3 % — ABNORMAL HIGH (ref 4.6–6.5)

## 2015-03-07 LAB — LIPID PANEL
CHOLESTEROL: 178 mg/dL (ref 0–200)
HDL: 51.6 mg/dL (ref >39.00)
LDL CALC: 91 mg/dL (ref 0–99)
NONHDL: 126.4
Total CHOL/HDL Ratio: 3
Triglycerides: 176 mg/dL — ABNORMAL HIGH (ref 0.0–149.0)
VLDL: 35.2 mg/dL (ref 0.0–40.0)

## 2015-03-07 LAB — TSH: TSH: 2.72 u[IU]/mL (ref 0.35–4.50)

## 2015-03-07 MED ORDER — PHENTERMINE HCL 37.5 MG PO TABS: 37.5000 mg | ORAL_TABLET | Freq: Every day | ORAL | Status: DC

## 2015-03-07 NOTE — Progress Notes (Signed)
Pre visit review using our clinic review tool, if applicable. No additional management support is needed unless otherwise documented below in the visit note. 

## 2015-03-07 NOTE — Assessment & Plan Note (Signed)
Xanax is helpful for pt. She reports improved sleep and quality of life.

## 2015-03-07 NOTE — Progress Notes (Signed)
Subjective:    Patient ID: Natalie Greene, female    DOB: 1971/10/22, 44 y.o.   MRN: [id number]  HPI  Natalie Greene is a 44 yo female here for a 4 week follow up.   1) Down 12 lbs from last visit!   Wt Readings from Last 3 Encounters:  03/07/15 229 lb 4 oz (103.987 kg)  02/07/15 241 lb 6.4 oz (109.498 kg)   2) Diet- 2 eggs, salads, snack weight watchers, water only Exercise- nothing yet, wants to start soon Phentermine- likes the capsules, but states the tablets are cheaper.    Review of Systems  Constitutional: Negative for fever, chills, diaphoresis and fatigue.  Respiratory: Negative for chest tightness, shortness of breath and wheezing.   Cardiovascular: Negative for chest pain, palpitations and leg swelling.  Gastrointestinal: Negative for nausea, vomiting, diarrhea and constipation.  Skin: Negative for rash.  Neurological: Negative for dizziness, weakness, numbness and headaches.  Psychiatric/Behavioral: Negative for sleep disturbance. The patient is not nervous/anxious.        Sleep is improved on Xanax.    Past Medical History  Diagnosis Date  . Lupus   . GERD (gastroesophageal reflux disease)     History   Social History  . Marital Status: Single    Spouse Name: N/A  . Number of Children: N/A  . Years of Education: N/A   Occupational History  . Not on file.   Social History Main Topics  . Smoking status: Never Smoker   . Smokeless tobacco: Not on file  . Alcohol Use: No  . Drug Use: No  . Sexual Activity:    Partners: Male   Other Topics Concern  . 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 Abdominal hysterectomy    .  Cholecystectomy    . Wisdom tooth extraction      Family History  Problem Relation Age of Onset  . Cancer Father     colon and prostate  . Diabetes Father     No Known Allergies  Current Outpatient Prescriptions on File Prior to Visit  Medication Sig Dispense Refill  . ALPRAZolam (XANAX) 1 MG tablet Take 1 tablet (1 mg total) by mouth at bedtime as needed for anxiety. 30 tablet 2  . esomeprazole (NEXIUM) 40 MG capsule Take 1 capsule (40 mg total) by mouth daily before breakfast. 30 capsule 5  . hydrochlorothiazide (HYDRODIURIL) 25 MG tablet Take 1 tablet (25 mg total) by mouth daily. 30 tablet 11   No current facility-administered medications on file prior to visit.      Objective:   Physical Exam  Constitutional: She is oriented to person, place, and time. She appears well-developed and well-nourished. No distress.  BP 122/86 mmHg  Pulse 72  Resp 12  Ht 5' 1.5" (1.562 m)  Wt 229 lb 4 oz (103.987 kg)  BMI 42.62 kg/m2  SpO2 98%   HENT:  Head: Normocephalic and atraumatic.  Right Ear: External ear normal.  Left Ear: External ear normal.  Cardiovascular: Normal rate, regular rhythm, normal heart sounds and  intact distal pulses.  Exam reveals no gallop and no friction rub.   No murmur heard. Pulmonary/Chest: Effort normal and breath sounds normal. No respiratory distress. She has no wheezes. She has no rales. She exhibits no tenderness.  Neurological: She is alert and oriented to person, place, and time. No cranial nerve deficit. She exhibits normal muscle tone. Coordination normal.  Skin: Skin is warm and dry. No rash noted. She is not diaphoretic.  Psychiatric: She has a normal mood and affect. Her behavior is normal. Judgment and thought content normal.        Assessment & Plan:

## 2015-03-07 NOTE — Assessment & Plan Note (Addendum)
Improving! Switched from Phentermine capsules to tablets because they are cheaper. Pt has overhauled her diet and is very pleased with the results thus far. Goal is below 200 lbs. Will follow up in 3 months.   Pt fasting today- will obtain TSH, Lipids, A1c, CMET, and CBC w/ diff.

## 2015-03-07 NOTE — Patient Instructions (Addendum)
Keep up the great work!   See you in 3 months.  Got to the lab before leaving today and we will contact you with your results.

## 2015-03-10 ENCOUNTER — Encounter: Payer: Self-pay | Admitting: *Deleted

## 2015-04-07 ENCOUNTER — Other Ambulatory Visit: Payer: Self-pay | Admitting: Nurse Practitioner

## 2015-04-07 ENCOUNTER — Telehealth: Payer: Self-pay

## 2015-04-07 MED ORDER — FLUCONAZOLE 150 MG PO TABS: 150.0000 mg | ORAL_TABLET | Freq: Once | ORAL | Status: DC

## 2015-04-07 NOTE — Telephone Encounter (Signed)
The pt called and believes she has a yeast infection. She is hoping to have an rx for diflucan called in. [facility] [place] - [phone]

## 2015-04-07 NOTE — Telephone Encounter (Signed)
Sent to pharmacy. If not helpful she will need to be seen. Thanks!

## 2015-05-11 ENCOUNTER — Encounter: Payer: Self-pay | Admitting: Nurse Practitioner

## 2015-05-11 ENCOUNTER — Ambulatory Visit (INDEPENDENT_AMBULATORY_CARE_PROVIDER_SITE_OTHER): Payer: No Typology Code available for payment source | Admitting: Nurse Practitioner

## 2015-05-11 VITALS — BP 122/88 | HR 76 | Temp 98.5°F | Resp 16 | Ht 62.0 in | Wt 214.0 lb

## 2015-05-11 DIAGNOSIS — E876 Hypokalemia: Secondary | ICD-10-CM | POA: Diagnosis not present

## 2015-05-11 LAB — COMPREHENSIVE METABOLIC PANEL
ALT: 38 U/L — AB (ref 0–35)
AST: 20 U/L (ref 0–37)
Albumin: 4.1 g/dL (ref 3.5–5.2)
Alkaline Phosphatase: 59 U/L (ref 39–117)
BILIRUBIN TOTAL: 0.4 mg/dL (ref 0.2–1.2)
BUN: 10 mg/dL (ref 6–23)
CALCIUM: 9.4 mg/dL (ref 8.4–10.5)
CHLORIDE: 99 meq/L (ref 96–112)
CO2: 30 meq/L (ref 19–32)
CREATININE: 0.75 mg/dL (ref 0.40–1.20)
GFR: 89.25 mL/min (ref >60.00)
Glucose, Bld: 134 mg/dL — ABNORMAL HIGH (ref 70–99)
POTASSIUM: 4.2 meq/L (ref 3.5–5.1)
Sodium: 136 mEq/L (ref 135–145)
TOTAL PROTEIN: 7.1 g/dL (ref 6.0–8.3)

## 2015-05-11 MED ORDER — PHENTERMINE HCL 37.5 MG PO TABS: 37.5000 mg | ORAL_TABLET | Freq: Every day | ORAL | Status: DC

## 2015-05-11 MED ORDER — ALPRAZOLAM 1 MG PO TABS: 1.0000 mg | ORAL_TABLET | Freq: Every evening | ORAL | Status: DC | PRN

## 2015-05-11 NOTE — Progress Notes (Signed)
   Subjective:    Patient ID: Natalie Greene, female    DOB: Jul 20, 1971, 44 y.o.   MRN: [id number]  HPI  Natalie Greene is a 44 yo female following up on labs.   1) 2 weeks take early morning- when working day shift 2 weeks early afternoon- when working day shift   Drinking good amount water!  Eating healthier    Exercise- intermittent   Wt Readings from Last 3 Encounters:  05/11/15 214 lb (97.07 kg)  03/07/15 229 lb 4 oz (103.987 kg)  02/07/15 241 lb 6.4 oz (109.498 kg)    Review of Systems  Constitutional: Negative for fever, chills, diaphoresis and fatigue.  Respiratory: Negative for chest tightness, shortness of breath and wheezing.   Cardiovascular: Negative for chest pain, palpitations and leg swelling.  Gastrointestinal: Negative for nausea, vomiting and diarrhea.  Skin: Negative for rash.  Neurological: Negative for dizziness, weakness, numbness and headaches.  Psychiatric/Behavioral: The patient is not nervous/anxious.       Objective:   Physical Exam  Constitutional: She is oriented to person, place, and time. She appears well-developed and well-nourished. No distress.  BP 130/90 mmHg  Pulse 76  Temp(Src) 98.5 F (36.9 C)  Resp 16  Ht 5\' 2"  (1.575 m)  Wt 214 lb (97.07 kg)  BMI 39.13 kg/m2  SpO2 97%   HENT:  Head: Normocephalic and atraumatic.  Right Ear: External ear normal.  Left Ear: External ear normal.  Cardiovascular: Normal rate, regular rhythm and normal heart sounds.  Exam reveals no gallop and no friction rub.   No murmur heard. Pulmonary/Chest: Effort normal and breath sounds normal. No respiratory distress. She has no wheezes. She has no rales. She exhibits no tenderness.  Neurological: She is alert and oriented to person, place, and time. No cranial nerve deficit. She exhibits normal muscle tone. Coordination normal.  Skin: Skin is warm and dry. No rash noted. She is not diaphoretic.  Psychiatric: She has a normal mood and affect. Her behavior is  normal. Judgment and thought content normal.      Assessment & Plan:

## 2015-05-11 NOTE — Patient Instructions (Signed)
Repeat A1c in 1 month and look at progress.

## 2015-05-25 DIAGNOSIS — E876 Hypokalemia: Secondary | ICD-10-CM | POA: Insufficient documentation

## 2015-05-25 NOTE — Assessment & Plan Note (Addendum)
Following up on her labs repeating her CMET. Pt lost 15 lbs. Will follow. A1c repeat in 1 month.

## 2015-06-02 ENCOUNTER — Other Ambulatory Visit: Payer: Self-pay | Admitting: Nurse Practitioner

## 2015-06-02 ENCOUNTER — Telehealth: Payer: Self-pay | Admitting: *Deleted

## 2015-06-02 MED ORDER — FLUCONAZOLE 150 MG PO TABS: 150.0000 mg | ORAL_TABLET | Freq: Once | ORAL | Status: DC

## 2015-06-02 NOTE — Telephone Encounter (Signed)
Pt called requesting Diflucan for a yeast infection.  Pt has not been on an antibiotic recently.  She does not want to come in for an appoint.  Please advise

## 2015-06-02 NOTE — Telephone Encounter (Signed)
Sent to pharmacy. If not helpful will have to be seen.

## 2015-06-13 ENCOUNTER — Ambulatory Visit (INDEPENDENT_AMBULATORY_CARE_PROVIDER_SITE_OTHER): Payer: No Typology Code available for payment source | Admitting: Nurse Practitioner

## 2015-06-13 VITALS — BP 130/88 | HR 81 | Temp 98.7°F | Resp 16 | Ht 62.0 in | Wt 210.0 lb

## 2015-06-13 DIAGNOSIS — E119 Type 2 diabetes mellitus without complications: Secondary | ICD-10-CM

## 2015-06-13 DIAGNOSIS — Z8619 Personal history of other infectious and parasitic diseases: Secondary | ICD-10-CM | POA: Diagnosis not present

## 2015-06-13 DIAGNOSIS — E876 Hypokalemia: Secondary | ICD-10-CM | POA: Diagnosis not present

## 2015-06-13 DIAGNOSIS — F411 Generalized anxiety disorder: Secondary | ICD-10-CM

## 2015-06-13 LAB — HEMOGLOBIN A1C: Hgb A1c MFr Bld: 6.5 % (ref 4.6–6.5)

## 2015-06-13 LAB — POTASSIUM: Potassium: 3.6 mEq/L (ref 3.5–5.1)

## 2015-06-13 MED ORDER — BUSPIRONE HCL 7.5 MG PO TABS: 7.5000 mg | ORAL_TABLET | Freq: Every day | ORAL | Status: DC

## 2015-06-13 NOTE — Progress Notes (Signed)
Pre visit review using our clinic review tool, if applicable. No additional management support is needed unless otherwise documented below in the visit note. 

## 2015-06-13 NOTE — Patient Instructions (Signed)
If your potassium is still on the low end we will switch your HCTZ to a potassium-sparing diuretic.   Keep up the good work on diet/exercise!   Call us if you need the acyclovir.   Buspirone in the am for 2 weeks.

## 2015-06-13 NOTE — Progress Notes (Signed)
   Subjective:    Patient ID: Natalie Greene, female    DOB: 09/23/1971, 44 y.o.   MRN: [id number]  HPI  Natalie Greene is a 44 yo female with a F/u on hypokalemia and weight loss.   1) Wt loss-  Wt Readings from Last 3 Encounters:  06/13/15 210 lb (95.255 kg)  05/11/15 214 lb (97.07 kg)  03/07/15 229 lb 4 oz (103.987 kg)   Diet- 4 months without soda  Exercise- No changes, wants to start videos   2) Hypokalemia- 3.4   3) A1c- last 03/07/15- 7.3   4) Jittery in the morning, anxious, 1 mg xanax helpful at night. Has tried just buspirone in past, which was somewhat helpful.   5) Gets fever blisters in the summer- has not had any outbreaks recently, but takes acyclovir 400 mg 3 x a day for 5 days (will run out after next one she reports).   Review of Systems  Constitutional: Negative for fever, chills, diaphoresis, activity change, appetite change, fatigue and unexpected weight change.  Eyes: Negative for visual disturbance.  Respiratory: Negative for chest tightness, shortness of breath and wheezing.   Cardiovascular: Negative for chest pain, palpitations and leg swelling.  Gastrointestinal: Negative for nausea, vomiting and diarrhea.  Skin: Negative for rash.  Psychiatric/Behavioral: Negative for suicidal ideas, sleep disturbance and decreased concentration. The patient is nervous/anxious.        Objective:   Physical Exam  Constitutional: She is oriented to person, place, and time. She appears well-developed and well-nourished. No distress.  BP 130/88 mmHg  Pulse 81  Temp(Src) 98.7 F (37.1 C)  Resp 16  Ht 5\' 2"  (1.575 m)  Wt 210 lb (95.255 kg)  BMI 38.40 kg/m2  SpO2 95%    HENT:  Head: Normocephalic and atraumatic.  Right Ear: External ear normal.  Left Ear: External ear normal.  Cardiovascular: Normal rate, regular rhythm and normal heart sounds.  Exam reveals no gallop and no friction rub.   No murmur heard. Pulmonary/Chest: Effort normal and breath sounds normal.  No respiratory distress. She has no wheezes. She has no rales. She exhibits no tenderness.  Neurological: She is alert and oriented to person, place, and time. No cranial nerve deficit. She exhibits normal muscle tone. Coordination normal.  Skin: Skin is warm and dry. No rash noted. She is not diaphoretic.  Psychiatric: She has a normal mood and affect. Her behavior is normal. Judgment and thought content normal.          Assessment & Plan:

## 2015-06-21 ENCOUNTER — Encounter: Payer: Self-pay | Admitting: Nurse Practitioner

## 2015-06-21 DIAGNOSIS — Z8619 Personal history of other infectious and parasitic diseases: Secondary | ICD-10-CM | POA: Insufficient documentation

## 2015-06-21 NOTE — Assessment & Plan Note (Signed)
Will call in Acyclovir 400 mg 3 x a day for 5 days when pt has outbreak. She reports summer time is her worst time for outbreaks. Will follow up.

## 2015-06-21 NOTE — Assessment & Plan Note (Signed)
Will obtain an updated A1c since pt has lost wt.

## 2015-06-21 NOTE — Assessment & Plan Note (Signed)
Will start on Buspirone twice daily and see if this helps with the anxiety. She feels jittery in the morning especially. 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

## 2015-06-21 NOTE — Assessment & Plan Note (Signed)
Weight loss slow and steady. Pt slightly discouraged because she feels it should be more. She was given encouragement and gave her the advice that 1-1.5 lbs loss per week is normal and will help to avoid a quick regain.

## 2015-06-21 NOTE — Assessment & Plan Note (Signed)
Check a potassium level to see if we need to change to a potassium sparing diuretic. *Update- level okay will continue the HCTZ.

## 2015-08-06 ENCOUNTER — Other Ambulatory Visit: Payer: Self-pay | Admitting: Nurse Practitioner

## 2015-08-08 NOTE — Telephone Encounter (Signed)
Last OV 6.27.16, last refill 7.21.16.  Please advise refill

## 2015-08-08 NOTE — Telephone Encounter (Signed)
rx faxed

## 2015-08-09 ENCOUNTER — Telehealth: Payer: Self-pay

## 2015-08-09 ENCOUNTER — Other Ambulatory Visit: Payer: Self-pay | Admitting: Nurse Practitioner

## 2015-08-09 NOTE — Telephone Encounter (Signed)
Please advise refill? 

## 2015-08-09 NOTE — Telephone Encounter (Addendum)
Pt was called and left a voicemail stating she could pick up her rx at the pharmacy.

## 2015-08-10 ENCOUNTER — Other Ambulatory Visit: Payer: Self-pay | Admitting: *Deleted

## 2015-08-10 MED ORDER — HYDROCHLOROTHIAZIDE 25 MG PO TABS: 25.0000 mg | ORAL_TABLET | Freq: Every day | ORAL | Status: DC

## 2015-08-10 NOTE — Telephone Encounter (Signed)
Rx faxed by Caryl Pina

## 2015-08-23 ENCOUNTER — Ambulatory Visit (INDEPENDENT_AMBULATORY_CARE_PROVIDER_SITE_OTHER): Payer: No Typology Code available for payment source | Admitting: Nurse Practitioner

## 2015-08-23 VITALS — BP 138/104 | HR 80 | Temp 98.2°F | Resp 14 | Ht 62.0 in | Wt 206.2 lb

## 2015-08-23 DIAGNOSIS — Z114 Encounter for screening for human immunodeficiency virus [HIV]: Secondary | ICD-10-CM | POA: Diagnosis not present

## 2015-08-23 DIAGNOSIS — E119 Type 2 diabetes mellitus without complications: Secondary | ICD-10-CM | POA: Diagnosis not present

## 2015-08-23 DIAGNOSIS — Z130 Encounter for screening for diseases of the blood and blood-forming organs and certain disorders involving the immune mechanism: Secondary | ICD-10-CM

## 2015-08-23 DIAGNOSIS — F411 Generalized anxiety disorder: Secondary | ICD-10-CM

## 2015-08-23 LAB — CBC WITH DIFFERENTIAL/PLATELET
BASOS PCT: 0.5 % (ref 0.0–3.0)
Basophils Absolute: 0 10*3/uL (ref 0.0–0.1)
Eosinophils Absolute: 0.1 10*3/uL (ref 0.0–0.7)
Eosinophils Relative: 1.2 % (ref 0.0–5.0)
HEMATOCRIT: 39 % (ref 36.0–46.0)
Hemoglobin: 12.8 g/dL (ref 12.0–15.0)
LYMPHS PCT: 20.1 % (ref 12.0–46.0)
Lymphs Abs: 2 10*3/uL (ref 0.7–4.0)
MCHC: 32.9 g/dL (ref 30.0–36.0)
MCV: 86.1 fl (ref 78.0–100.0)
MONOS PCT: 7.4 % (ref 3.0–12.0)
Monocytes Absolute: 0.7 10*3/uL (ref 0.1–1.0)
NEUTROS ABS: 7.1 10*3/uL (ref 1.4–7.7)
Neutrophils Relative %: 70.8 % (ref 43.0–77.0)
PLATELETS: 236 10*3/uL (ref 150.0–400.0)
RBC: 4.53 Mil/uL (ref 3.87–5.11)
RDW: 14.2 % (ref 11.5–15.5)
WBC: 10.1 10*3/uL (ref 4.0–10.5)

## 2015-08-23 LAB — HIV ANTIBODY (ROUTINE TESTING W REFLEX): HIV 1&2 Ab, 4th Generation: NONREACTIVE

## 2015-08-23 LAB — MICROALBUMIN / CREATININE URINE RATIO
Creatinine,U: 167.3 mg/dL
Microalb Creat Ratio: 0.6 mg/g (ref 0.0–30.0)
Microalb, Ur: 1 mg/dL (ref 0.0–1.9)

## 2015-08-23 LAB — FERRITIN: Ferritin: 27.5 ng/mL (ref 10.0–291.0)

## 2015-08-23 NOTE — Progress Notes (Signed)
Pre visit review using our clinic review tool, if applicable. No additional management support is needed unless otherwise documented below in the visit note. 

## 2015-08-23 NOTE — Progress Notes (Signed)
Patient ID: Natalie Greene, female    DOB: 03/25/1971  Age: 44 y.o. MRN: [id number]  CC: Follow-up   HPI Natalie Greene presents for follow up on weight loss and Diabetes.   1) DM- Foot exam today, last A1c 6.5   2) HTN- Uncontrolled today   3) Down another 4 lbs  Wt Readings from Last 3 Encounters:  08/23/15 206 lb 3.2 oz (93.532 kg)  06/13/15 210 lb (95.255 kg)  05/11/15 214 lb (97.07 kg)   4) 7 pm last night last dosage of Xanax, still helpful for sleep and anxiety.    History Natalie Greene has a past medical history of Lupus and GERD (gastroesophageal reflux disease).   Natalie Greene has past surgical history that includes Abdominal hysterectomy; Cholecystectomy; and Wisdom tooth extraction.   Natalie Greene family history includes Cancer in Natalie Greene father; Diabetes in Natalie Greene father.Natalie Greene reports that Natalie Greene has never smoked. Natalie Greene does not have any smokeless tobacco history on file. Natalie Greene reports that Natalie Greene does not drink alcohol or use illicit drugs.  Outpatient Prescriptions Prior to Visit  Medication Sig Dispense Refill  . ALPRAZolam (XANAX) 1 MG tablet TAKE 1 TABLET BY MOUTH AT BEDTIME AS NEEDED FOR ANXIETY 30 tablet 2  . hydrochlorothiazide (HYDRODIURIL) 25 MG tablet Take 1 tablet (25 mg total) by mouth daily. 90 tablet 1  . phentermine (ADIPEX-P) 37.5 MG tablet TAKE 1 TABLET BY MOUTH DAILY BEFORE BREAKFAST 30 tablet 0  . busPIRone (BUSPAR) 7.5 MG tablet Take 1 tablet (7.5 mg total) by mouth daily. 30 tablet 1  . esomeprazole (NEXIUM) 40 MG capsule Take 1 capsule (40 mg total) by mouth daily before breakfast. 30 capsule 5  . fluconazole (DIFLUCAN) 150 MG tablet Take 1 tablet (150 mg total) by mouth once. 1 tablet 0   No facility-administered medications prior to visit.    ROS Review of Systems  Constitutional: Negative for fever, chills, diaphoresis, fatigue and unexpected weight change.  Respiratory: Negative for chest tightness, shortness of breath and wheezing.   Cardiovascular: Negative for chest pain,  palpitations and leg swelling.  Gastrointestinal: Negative for nausea, vomiting and diarrhea.  Endocrine: Negative for polydipsia, polyphagia and polyuria.  Skin: Negative for rash.  Neurological: Negative for dizziness, weakness, numbness and headaches.  Psychiatric/Behavioral: Positive for sleep disturbance. The patient is nervous/anxious.    Objective:  BP 138/104 mmHg  Pulse 80  Temp(Src) 98.2 F (36.8 C)  Resp 14  Ht 5\' 2"  (1.575 m)  Wt 206 lb 3.2 oz (93.532 kg)  BMI 37.71 kg/m2  SpO2 96%  Physical Exam  Constitutional: Natalie Greene is oriented to person, place, and time. Natalie Greene appears well-developed and well-nourished. No distress.  HENT:  Head: Normocephalic and atraumatic.  Right Ear: External ear normal.  Left Ear: External ear normal.  Cardiovascular: Normal rate, regular rhythm and intact distal pulses.   Intact dorsalis pedis and posterior tibialis.   Pulmonary/Chest: Effort normal and breath sounds normal. No respiratory distress. Natalie Greene has no wheezes. Natalie Greene has no rales. Natalie Greene exhibits no tenderness.  Neurological: Natalie Greene is alert and oriented to person, place, and time. No cranial nerve deficit. Natalie Greene exhibits normal muscle tone. Coordination normal.  Normal monofilament exam bilaterally  Skin: Skin is warm and dry. No rash noted. Natalie Greene is not diaphoretic.  No wounds of feet  Psychiatric: Natalie Greene has a normal mood and affect. Natalie Greene behavior is normal. Judgment and thought content normal.   Assessment & Plan:   Natalie Greene was seen today for follow-up.  Diagnoses and all orders  for this visit:  Type 2 diabetes mellitus without complication -     Urine Microalbumin w/creat. ratio  Encounter for screening for HIV -     HIV antibody (with reflex)  Screening for deficiency anemia -     Ferritin -     CBC with Differential/Platelet  Diabetes mellitus type II, controlled  Severe obesity (BMI >= 40)  Generalized anxiety disorder   I have discontinued Natalie Greene's esomeprazole,  fluconazole, and busPIRone. I am also having Natalie Greene maintain Natalie Greene ALPRAZolam, phentermine, and hydrochlorothiazide.  No orders of the defined types were placed in this encounter.     Follow-up: Return in about 3 months (around 11/22/2015).

## 2015-08-23 NOTE — Patient Instructions (Signed)
Please visit the lab before leaving today.  Follow up in 3 months.  

## 2015-09-04 ENCOUNTER — Encounter: Payer: Self-pay | Admitting: Nurse Practitioner

## 2015-09-04 NOTE — Assessment & Plan Note (Signed)
Obtained Urine microalbumin today, foot exam normal, pt is working on diet and exercise with the phentermine. Doing well.  Lab Results  Component Value Date   HGBA1C 6.5 06/13/2015   HGBA1C 7.3* 03/07/2015   Lab Results  Component Value Date   MICROALBUR 1.0 08/23/2015   LDLCALC 91 03/07/2015   CREATININE 0.75 05/11/2015

## 2015-09-04 NOTE — Assessment & Plan Note (Signed)
Pt stable on Xanax. Did not care for buspar. Will follow.

## 2015-09-04 NOTE — Assessment & Plan Note (Signed)
Wt Readings from Last 3 Encounters:  08/23/15 206 lb 3.2 oz (93.532 kg)  06/13/15 210 lb (95.255 kg)  05/11/15 214 lb (97.07 kg)   Patient continues to lose. Would like to be below 200 lbs. Encouraged increasing exercise. This will help with anxiety as well. Will follow

## 2015-09-12 ENCOUNTER — Other Ambulatory Visit: Payer: Self-pay | Admitting: Nurse Practitioner

## 2015-09-12 DIAGNOSIS — Z1231 Encounter for screening mammogram for malignant neoplasm of breast: Secondary | ICD-10-CM

## 2015-09-12 NOTE — Telephone Encounter (Signed)
Please advise on refill.

## 2015-09-14 ENCOUNTER — Ambulatory Visit
Admission: RE | Admit: 2015-09-14 | Discharge: 2015-09-14 | Disposition: A | Discharge status: Home / Self Care | Payer: No Typology Code available for payment source | Source: Ambulatory Visit | Attending: Nurse Practitioner | Admitting: Nurse Practitioner

## 2015-09-14 DIAGNOSIS — Z1231 Encounter for screening mammogram for malignant neoplasm of breast: Secondary | ICD-10-CM | POA: Diagnosis not present

## 2015-10-17 ENCOUNTER — Other Ambulatory Visit: Payer: Self-pay | Admitting: Nurse Practitioner

## 2015-10-17 MED ORDER — PHENTERMINE HCL 37.5 MG PO TABS: ORAL_TABLET | ORAL | Status: DC

## 2015-10-17 NOTE — Telephone Encounter (Signed)
Advise refill?  Was last refilled on 09/13/15?

## 2015-10-17 NOTE — Telephone Encounter (Signed)
Needing a refill only has 1 pill left of her phentermine (ADIPEX-P) 37.5 MG tablet

## 2015-11-02 ENCOUNTER — Other Ambulatory Visit: Payer: Self-pay | Admitting: Nurse Practitioner

## 2015-11-03 NOTE — Telephone Encounter (Signed)
Refill xanax

## 2015-12-21 ENCOUNTER — Other Ambulatory Visit: Payer: Self-pay | Admitting: Nurse Practitioner

## 2015-12-21 NOTE — Telephone Encounter (Signed)
Please advise of refill of Phentermine.

## 2015-12-22 ENCOUNTER — Telehealth: Payer: Self-pay | Admitting: *Deleted

## 2015-12-22 NOTE — Telephone Encounter (Signed)
Patient has requested a medication refill for phentermine

## 2015-12-22 NOTE — Telephone Encounter (Signed)
Please advise refill, refilled last on 10/17/2015?

## 2015-12-27 ENCOUNTER — Other Ambulatory Visit: Payer: Self-pay | Admitting: Nurse Practitioner

## 2015-12-27 MED ORDER — ALPRAZOLAM 1 MG PO TABS: ORAL_TABLET | ORAL | Status: DC

## 2015-12-27 NOTE — Telephone Encounter (Signed)
Please advise 

## 2015-12-27 NOTE — Telephone Encounter (Signed)
Request sent to [facility] for approval.

## 2015-12-27 NOTE — Telephone Encounter (Signed)
Pt also needs a refill for ALPRAZolam (XANAX) 1 MG tablet. Same pharmacy. Thank You!

## 2015-12-27 NOTE — Telephone Encounter (Signed)
Pt called about her medication phentermine (ADIPEX-P) 37.5 MG tablet. [id number] [place], [place] - [address]. Call pt @ [phone]. Thank You!

## 2016-01-25 ENCOUNTER — Telehealth: Payer: Self-pay | Admitting: Nurse Practitioner

## 2016-01-25 NOTE — Telephone Encounter (Signed)
Please advise for labs, last last were on 08/23/2015 and her upcoming appointment is 02/16/2016.  Thanks

## 2016-01-25 NOTE — Telephone Encounter (Signed)
Pt wanted to get lab work done before her appt. Need order please and thank you! Call pt @ [phone].

## 2016-01-26 NOTE — Telephone Encounter (Signed)
Patient stated if you don't think she needs them then she will not get any yet then.

## 2016-01-26 NOTE — Telephone Encounter (Signed)
I don't see that she is due for anything until June. Is there anything she wanted in particular? We technically could do a A1c, but she was at goal in Sept.

## 2016-01-26 NOTE — Telephone Encounter (Signed)
Attempted to call patient, left a detailed message to return my call with lab tests that she is requesting.  Thanks

## 2016-02-14 ENCOUNTER — Encounter: Payer: Self-pay | Admitting: Internal Medicine

## 2016-02-14 ENCOUNTER — Telehealth: Payer: Self-pay | Admitting: Nurse Practitioner

## 2016-02-14 ENCOUNTER — Ambulatory Visit (INDEPENDENT_AMBULATORY_CARE_PROVIDER_SITE_OTHER): Payer: BLUE CROSS/BLUE SHIELD | Admitting: Internal Medicine

## 2016-02-14 VITALS — BP 128/88 | HR 84 | Temp 98.9°F | Resp 14 | Ht 62.0 in | Wt 199.6 lb

## 2016-02-14 DIAGNOSIS — J111 Influenza due to unidentified influenza virus with other respiratory manifestations: Secondary | ICD-10-CM | POA: Diagnosis not present

## 2016-02-14 LAB — POCT INFLUENZA A/B
Influenza A, POC: POSITIVE — AB
Influenza B, POC: POSITIVE — AB

## 2016-02-14 MED ORDER — HYDROCOD POLST-CPM POLST ER 10-8 MG/5ML PO SUER: 5.0000 mL | Freq: Two times a day (BID) | ORAL | Status: DC | PRN

## 2016-02-14 MED ORDER — OSELTAMIVIR PHOSPHATE 75 MG PO CAPS: 75.0000 mg | ORAL_CAPSULE | Freq: Two times a day (BID) | ORAL | Status: DC

## 2016-02-14 NOTE — Assessment & Plan Note (Signed)
Symptoms and exam c/w flu. Rapid flu pos. We discussed that it may be too late for Tamiflu to be effective, however will try in hopes of shortening course. Use Tussionex prn for cough. Discussed risks of this medication. Return precautions given.

## 2016-02-14 NOTE — Progress Notes (Signed)
Subjective:    Patient ID: Natalie Greene, female    DOB: August 05, 1971, 45 y.o.   MRN: [id number]  HPI  45YO female presents for acute visit.  Cough - Friday afternoon, felt sluggish with sore throat. Saturday felt extremely tired with diffuse myalgia. Continues to feel exhausted. Cough with occasional mucous. Fever 101F. Using Tylenol and Motrin. Husband has been sick with similar symptoms.  Wt Readings from Last 3 Encounters:  02/14/16 199 lb 9.6 oz (90.538 kg)  08/23/15 206 lb 3.2 oz (93.532 kg)  06/13/15 210 lb (95.255 kg)   BP Readings from Last 3 Encounters:  02/14/16 128/88  08/23/15 138/104  06/13/15 130/88    Past Medical History  Diagnosis Date  . Lupus ([place])   . GERD (gastroesophageal reflux disease)    Family History  Problem Relation Age of Onset  . Cancer Father     colon and prostate  . Diabetes Father   . Breast cancer Neg Hx    Past Surgical History  Procedure Laterality Date  . Abdominal hysterectomy    . Cholecystectomy    . Wisdom tooth extraction     Social History   Social History  . Marital Status: Single    Spouse Name: N/A  . Number of Children: N/A  . Years of Education: N/A   Social History Main Topics  . Smoking status: Never Smoker   . Smokeless tobacco: None  . Alcohol Use: No  . Drug Use: No  . Sexual Activity:    Partners: Male   Other Topics Concern  . 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 Negative for unexpected weight change.  HENT: Positive for congestion, postnasal drip, rhinorrhea, sinus pressure and sore throat. Negative for ear discharge, ear pain, facial swelling, hearing  loss, mouth sores, nosebleeds, sneezing, tinnitus, trouble swallowing and voice change.   Eyes: Negative for pain, discharge, redness and visual disturbance.  Respiratory: Positive for cough. Negative for chest tightness, shortness of breath, wheezing and stridor.   Cardiovascular: Negative for chest pain, palpitations and leg swelling.  Gastrointestinal: Negative for nausea, vomiting, diarrhea and constipation.  Musculoskeletal: Positive for myalgias and back pain. Negative for arthralgias, neck pain and neck stiffness.  Skin: Negative for color change and rash.  Neurological: Negative for dizziness, weakness, light-headedness and headaches.  Hematological: Negative for adenopathy.       Objective:    BP 128/88 mmHg  Pulse 84  Temp(Src) 98.9 F (37.2 C) (Oral)  Resp 14  Ht 5\' 2"  (1.575 m)  Wt 199 lb 9.6 oz (90.538 kg)  BMI 36.50 kg/m2  SpO2 98% Physical Exam  Constitutional: She is oriented to person, place, and time. She appears well-developed and well-nourished. No distress.  HENT:  Head: Normocephalic and atraumatic.  Right Ear: External ear normal.  Left Ear: External ear normal.  Nose: Nose normal.  Mouth/Throat: Oropharynx is clear and moist. No oropharyngeal exudate.  Eyes: Conjunctivae are normal. Pupils are equal, round, and reactive to light. Right eye exhibits no discharge. Left eye exhibits no discharge. No  scleral icterus.  Neck: Normal range of motion. Neck supple. No tracheal deviation present. No thyromegaly present.  Cardiovascular: Normal rate, regular rhythm, normal heart sounds and intact distal pulses.  Exam reveals no gallop and no friction rub.   No murmur heard. Pulmonary/Chest: Effort normal and breath sounds normal. No accessory muscle usage. No respiratory distress. She has no decreased breath sounds. She has no wheezes. She has no rhonchi. She has no rales. She exhibits no tenderness.  Musculoskeletal: Normal range of motion. She exhibits no edema or  tenderness.  Lymphadenopathy:    She has no cervical adenopathy.  Neurological: She is alert and oriented to person, place, and time. No cranial nerve deficit. She exhibits normal muscle tone. Coordination normal.  Skin: Skin is warm and dry. No rash noted. She is not diaphoretic. No erythema. No pallor.  Psychiatric: She has a normal mood and affect. Her behavior is normal. Judgment and thought content normal.          Assessment & Plan:   Problem List Items Addressed This Visit      Unprioritized   Influenza with respiratory manifestation - Primary    Symptoms and exam c/w flu. Rapid flu pos. We discussed that it may be too late for Tamiflu to be effective, however will try in hopes of shortening course. Use Tussionex prn for cough. Discussed risks of this medication. Return precautions given.      Relevant Medications   oseltamivir (TAMIFLU) 75 MG capsule   chlorpheniramine-HYDROcodone (TUSSIONEX PENNKINETIC ER) 10-8 MG/5ML SUER       Return if symptoms worsen or fail to improve.  Ronette Deter, MD Internal Medicine [facility]

## 2016-02-14 NOTE — Patient Instructions (Signed)

## 2016-02-14 NOTE — Progress Notes (Signed)
Pre visit review using our clinic review tool, if applicable. No additional management support is needed unless otherwise documented below in the visit note. 

## 2016-02-14 NOTE — Addendum Note (Signed)
Addended by: Karlene Einstein D on: 02/14/2016 04:57 PM   Modules accepted: Orders

## 2016-02-16 ENCOUNTER — Ambulatory Visit: Payer: No Typology Code available for payment source | Admitting: Nurse Practitioner

## 2016-02-23 ENCOUNTER — Other Ambulatory Visit: Payer: Self-pay | Admitting: Nurse Practitioner

## 2016-02-23 ENCOUNTER — Other Ambulatory Visit: Payer: Self-pay | Admitting: *Deleted

## 2016-02-23 MED ORDER — FLUCONAZOLE 150 MG PO TABS: 150.0000 mg | ORAL_TABLET | Freq: Once | ORAL | Status: DC

## 2016-02-23 NOTE — Telephone Encounter (Signed)
Script filled.

## 2016-02-23 NOTE — Telephone Encounter (Signed)
Pt is requesting a diflucan prescription for a yeast infection. Pt is currently taking an antibiotic for the flu. Please advise, thanks

## 2016-02-23 NOTE — Telephone Encounter (Signed)
Patient was tested postive for the flu, she has been taking tamiflu and a antibiotic. She has requested to have a Rx for diflucan,due to familiar symptoms of a yeast infection .  [facility] [address]

## 2016-02-27 ENCOUNTER — Ambulatory Visit: Payer: BLUE CROSS/BLUE SHIELD | Admitting: Nurse Practitioner

## 2016-03-05 ENCOUNTER — Ambulatory Visit
Admission: RE | Admit: 2016-03-05 | Discharge: 2016-03-05 | Disposition: A | Discharge status: Home / Self Care | Payer: BLUE CROSS/BLUE SHIELD | Source: Ambulatory Visit | Attending: Nurse Practitioner | Admitting: Nurse Practitioner

## 2016-03-05 ENCOUNTER — Ambulatory Visit (INDEPENDENT_AMBULATORY_CARE_PROVIDER_SITE_OTHER): Payer: BLUE CROSS/BLUE SHIELD | Admitting: Nurse Practitioner

## 2016-03-05 ENCOUNTER — Encounter: Payer: Self-pay | Admitting: Nurse Practitioner

## 2016-03-05 VITALS — BP 136/84 | HR 69 | Temp 98.9°F | Resp 14 | Ht 62.0 in | Wt 201.2 lb

## 2016-03-05 DIAGNOSIS — M25561 Pain in right knee: Secondary | ICD-10-CM

## 2016-03-05 DIAGNOSIS — Z8619 Personal history of other infectious and parasitic diseases: Secondary | ICD-10-CM

## 2016-03-05 DIAGNOSIS — M1711 Unilateral primary osteoarthritis, right knee: Secondary | ICD-10-CM | POA: Diagnosis not present

## 2016-03-05 DIAGNOSIS — K219 Gastro-esophageal reflux disease without esophagitis: Secondary | ICD-10-CM

## 2016-03-05 MED ORDER — MELOXICAM 15 MG PO TABS: 15.0000 mg | ORAL_TABLET | Freq: Every day | ORAL | Status: DC

## 2016-03-05 MED ORDER — VALACYCLOVIR HCL 500 MG PO TABS: 500.0000 mg | ORAL_TABLET | Freq: Two times a day (BID) | ORAL | Status: DC

## 2016-03-05 MED ORDER — ESOMEPRAZOLE MAGNESIUM 40 MG PO CPDR: 40.0000 mg | DELAYED_RELEASE_CAPSULE | Freq: Every day | ORAL | Status: DC

## 2016-03-05 MED ORDER — PHENTERMINE HCL 37.5 MG PO TABS: ORAL_TABLET | ORAL | Status: DC

## 2016-03-05 NOTE — Patient Instructions (Addendum)
[facility] [place], [place] [address] Hours of Operation Monday - Friday, 8 a.m. - 5 p.m. Main: [phone]   We will call with your results when we get those back.

## 2016-03-05 NOTE — Progress Notes (Signed)
Patient ID: Natalie Greene, female    DOB: 08/09/1971  Age: 45 y.o. MRN: [id number]  CC: Knee Pain   HPI Natalie Greene presents for CC of right knee pain x 2 months   1) Pops when walking   Onset- 2 months ago  Location- right knee feels deep behind knee cap  Duration - intermittent, daily since Friday  Characteristics- sharp pain  Aggravating factors- walking, putting weight on it  Relieving factors- resting- no pain  Severity- moderate to severe   Friday got worse, yesterday was worse   Treatment to date: Knee sleeves  Brace with velcro- even when sleeping  Ibuprofen   2) Fever blisters- winter and summer time outbreaks   3) GERD- Nexium 40 mg   History Natalie Greene has a past medical history of Lupus ([place]) and GERD (gastroesophageal reflux disease).   She has past surgical history that includes Abdominal hysterectomy; Cholecystectomy; and Wisdom tooth extraction.   Her family history includes Cancer in her father; Diabetes in her father. There is no history of Breast cancer.She reports that she has never smoked. She does not have any smokeless tobacco history on file. She reports that she drinks alcohol. She reports that she does not use illicit drugs.  Outpatient Prescriptions Prior to Visit  Medication Sig Dispense Refill  . ALPRAZolam (XANAX) 1 MG tablet TAKE 1 TABLET BY MOUTH AT BEDTIME AS NEEDED FOR ANXIETY 30 tablet 2  . hydrochlorothiazide (HYDRODIURIL) 25 MG tablet TAKE 1 TABLET (25 MG TOTAL) BY MOUTH DAILY. 90 tablet 1  . chlorpheniramine-HYDROcodone (TUSSIONEX PENNKINETIC ER) 10-8 MG/5ML SUER Take 5 mLs by mouth every 12 (twelve) hours as needed for cough. (Patient not taking: Reported on 03/05/2016) 140 mL 0  . fluconazole (DIFLUCAN) 150 MG tablet Take 1 tablet (150 mg total) by mouth once. (Patient not taking: Reported on 03/05/2016) 1 tablet 0  . oseltamivir (TAMIFLU) 75 MG capsule Take 1 capsule (75 mg total) by mouth 2 (two) times daily. (Patient not taking:  Reported on 03/05/2016) 10 capsule 0  . phentermine (ADIPEX-P) 37.5 MG tablet Reported on 03/05/2016  1   No facility-administered medications prior to visit.    ROS Review of Systems  Constitutional: Negative for fever, chills, diaphoresis and fatigue.  Respiratory: Negative for chest tightness, shortness of breath and wheezing.   Cardiovascular: Negative for chest pain, palpitations and leg swelling.  Gastrointestinal: Negative for nausea, vomiting and diarrhea.  Musculoskeletal: Positive for arthralgias.       Knee pain- right  Skin: Negative for rash.  Neurological: Negative for dizziness, weakness, numbness and headaches.    Objective:  BP 136/84 mmHg  Pulse 69  Temp(Src) 98.9 F (37.2 C) (Oral)  Resp 14  Ht 5\' 2"  (1.575 m)  Wt 201 lb 3.2 oz (91.264 kg)  BMI 36.79 kg/m2  SpO2 96%  Physical Exam  Constitutional: She is oriented to person, place, and time. She appears well-developed and well-nourished. No distress.  HENT:  Head: Normocephalic and atraumatic.  Right Ear: External ear normal.  Left Ear: External ear normal.  Cardiovascular: Normal rate and regular rhythm.   Pulmonary/Chest: Effort normal and breath sounds normal. No respiratory distress. She has no wheezes. She has no rales. She exhibits no tenderness.  Musculoskeletal: Normal range of motion. She exhibits tenderness. She exhibits no edema.  Right knee tenderness, negative for edema at this time  Neurological: She is alert and oriented to person, place, and time. No cranial nerve deficit. She exhibits normal muscle tone.  Coordination normal.  Skin: Skin is warm and dry. No rash noted. She is not diaphoretic.  Psychiatric: She has a normal mood and affect. Her behavior is normal. Judgment and thought content normal.   Assessment & Plan:   Natalie Greene was seen today for knee pain.  Diagnoses and all orders for this visit:  Right knee pain -     DG Knee Complete 4 Views Right; Future  Morbid obesity due to  excess calories (HCC)  H/O cold sores  Gastroesophageal reflux disease without esophagitis  Other orders -     phentermine (ADIPEX-P) 37.5 MG tablet; Take 0.5 to 1 tablet before breakfast by mouth. -     esomeprazole (NEXIUM) 40 MG capsule; Take 1 capsule (40 mg total) by mouth daily at 12 noon. -     valACYclovir (VALTREX) 500 MG tablet; Take 1 tablet (500 mg total) by mouth 2 (two) times daily. -     meloxicam (MOBIC) 15 MG tablet; Take 1 tablet (15 mg total) by mouth daily.   I have discontinued Natalie Greene's oseltamivir, chlorpheniramine-HYDROcodone, and fluconazole. I have also changed her phentermine, esomeprazole, and valACYclovir. Additionally, I am having her start on meloxicam. Lastly, I am having her maintain her ALPRAZolam and hydrochlorothiazide.  Meds ordered this encounter  Medications  . DISCONTD: esomeprazole (NEXIUM) 40 MG capsule    Sig: Take 40 mg by mouth daily at 12 noon.  Marland Kitchen DISCONTD: valACYclovir (VALTREX) 500 MG tablet    Sig: Take 500 mg by mouth 2 (two) times daily.  . phentermine (ADIPEX-P) 37.5 MG tablet    Sig: Take 0.5 to 1 tablet before breakfast by mouth.    Dispense:  30 tablet    Refill:  2    Fill on or after March 27th, 2017    Order Specific Question:  Supervising Provider    Answer:  Natalie Greene [[id number]]  . esomeprazole (NEXIUM) 40 MG capsule    Sig: Take 1 capsule (40 mg total) by mouth daily at 12 noon.    Dispense:  90 capsule    Refill:  3    Order Specific Question:  Supervising Provider    Answer:  Natalie Greene [[id number]]  . valACYclovir (VALTREX) 500 MG tablet    Sig: Take 1 tablet (500 mg total) by mouth 2 (two) times daily.    Dispense:  30 tablet    Refill:  0    Order Specific Question:  Supervising Provider    Answer:  Natalie Greene [[id number]]  . meloxicam (MOBIC) 15 MG tablet    Sig: Take 1 tablet (15 mg total) by mouth daily.    Dispense:  30 tablet    Refill:  0    Order Specific Question:  Supervising Provider    Answer:   Natalie Greene [[id number]]     Follow-up: Return in about 3 months (around 06/05/2016) for Follow up.

## 2016-03-06 ENCOUNTER — Telehealth: Payer: Self-pay | Admitting: Nurse Practitioner

## 2016-03-06 NOTE — Telephone Encounter (Signed)
Pt is requesting test results from xray she had done she is still in pain, and pt is c/o her pain level is at a 10. Morey Hummingbird is out of the office today, can you please advise, thanks

## 2016-03-06 NOTE — Telephone Encounter (Signed)
She has mild bone spurring.  No fractures.  Natalie Greene will need to arrange follow up

## 2016-03-06 NOTE — Telephone Encounter (Signed)
Pt called to check if the Xray results are in she got the Xray on 03/05/2016. Also pt states the medication that was given is not working at all. Pt states her pain level is a 10 with pressure on it. [id number] [place], [place] - [address]. Call pt @ [phone]. Thank you!

## 2016-03-07 ENCOUNTER — Encounter: Payer: Self-pay | Admitting: Emergency Medicine

## 2016-03-07 ENCOUNTER — Emergency Department
Admission: EM | Admit: 2016-03-07 | Discharge: 2016-03-07 | Disposition: A | Discharge status: Home / Self Care | Payer: BLUE CROSS/BLUE SHIELD | Attending: Emergency Medicine | Admitting: Emergency Medicine

## 2016-03-07 ENCOUNTER — Emergency Department: Payer: BLUE CROSS/BLUE SHIELD

## 2016-03-07 DIAGNOSIS — M7121 Synovial cyst of popliteal space [Baker], right knee: Secondary | ICD-10-CM | POA: Diagnosis not present

## 2016-03-07 DIAGNOSIS — Z791 Long term (current) use of non-steroidal anti-inflammatories (NSAID): Secondary | ICD-10-CM | POA: Insufficient documentation

## 2016-03-07 DIAGNOSIS — Z79899 Other long term (current) drug therapy: Secondary | ICD-10-CM | POA: Insufficient documentation

## 2016-03-07 DIAGNOSIS — E119 Type 2 diabetes mellitus without complications: Secondary | ICD-10-CM | POA: Diagnosis not present

## 2016-03-07 DIAGNOSIS — M25561 Pain in right knee: Secondary | ICD-10-CM | POA: Diagnosis present

## 2016-03-07 DIAGNOSIS — K219 Gastro-esophageal reflux disease without esophagitis: Secondary | ICD-10-CM | POA: Diagnosis not present

## 2016-03-07 DIAGNOSIS — M329 Systemic lupus erythematosus, unspecified: Secondary | ICD-10-CM | POA: Diagnosis not present

## 2016-03-07 DIAGNOSIS — F411 Generalized anxiety disorder: Secondary | ICD-10-CM | POA: Insufficient documentation

## 2016-03-07 MED ORDER — ONDANSETRON 4 MG PO TBDP
4.0000 mg | ORAL_TABLET | Freq: Once | ORAL | Status: AC
  Administered 2016-03-07: 4 mg via ORAL

## 2016-03-07 MED ORDER — OXYCODONE-ACETAMINOPHEN 5-325 MG PO TABS: 1.0000 | ORAL_TABLET | Freq: Four times a day (QID) | ORAL | Status: DC | PRN

## 2016-03-07 MED ORDER — ONDANSETRON 4 MG PO TBDP
ORAL_TABLET | ORAL | Status: AC
  Administered 2016-03-07: 4 mg via ORAL
  Filled 2016-03-07: qty 1

## 2016-03-07 MED ORDER — OXYCODONE-ACETAMINOPHEN 5-325 MG PO TABS
ORAL_TABLET | ORAL | Status: AC
  Administered 2016-03-07: 2 via ORAL
  Filled 2016-03-07: qty 2

## 2016-03-07 MED ORDER — OXYCODONE-ACETAMINOPHEN 5-325 MG PO TABS
2.0000 | ORAL_TABLET | Freq: Once | ORAL | Status: AC
  Administered 2016-03-07: 2 via ORAL

## 2016-03-07 NOTE — ED Notes (Signed)
Crackers and ginger ale provided - pt reports nausea  "I think the percocet have upset my stomach."

## 2016-03-07 NOTE — Discharge Instructions (Signed)

## 2016-03-07 NOTE — ED Provider Notes (Signed)
[facility] Emergency Department Provider Note  ____________________________________________  Time seen: 6:20 AM  I have reviewed the triage vital signs and the nursing notes.   HISTORY  Chief Complaint Knee Pain      HPI Natalie Greene is a 45 y.o. female presents with current 10 out of 10 right knee pain times a few months with acute worsening 5 days ago. Patient states that she saw her primary care provider who performed an x-ray of but she does not know the results of that. Patient denies any trauma. Patient states that the pain is worse with ambulation mildly improved with immobility.     Past Medical History  Diagnosis Date  . Lupus ([place])   . GERD (gastroesophageal reflux disease)     Patient Active Problem List   Diagnosis Date Noted  . Influenza with respiratory manifestation 02/14/2016  . H/O cold sores 06/21/2015  . Diabetes mellitus type II, controlled ([place]) 06/13/2015  . Hypokalemia 05/25/2015  . Encounter to establish care 02/07/2015  . Severe obesity (BMI >= 40) ([place]) 02/07/2015  . Generalized anxiety disorder 02/07/2015    Past Surgical History  Procedure Laterality Date  . Abdominal hysterectomy    . Cholecystectomy    . Wisdom tooth extraction      Current Outpatient Rx  Name  Route  Sig  Dispense  Refill  . ALPRAZolam (XANAX) 1 MG tablet      TAKE 1 TABLET BY MOUTH AT BEDTIME AS NEEDED FOR ANXIETY   30 tablet   2     Not to exceed 3 additional fills before 02/04/2016   . esomeprazole (NEXIUM) 40 MG capsule   Oral   Take 1 capsule (40 mg total) by mouth daily at 12 noon.   90 capsule   3   . hydrochlorothiazide (HYDRODIURIL) 25 MG tablet      TAKE 1 TABLET (25 MG TOTAL) BY MOUTH DAILY.   90 tablet   1   . meloxicam (MOBIC) 15 MG tablet   Oral   Take 1 tablet (15 mg total) by mouth daily.   30 tablet   0   . phentermine (ADIPEX-P) 37.5 MG tablet      Take 0.5 to 1 tablet before breakfast by mouth.    30 tablet   2     Fill on or after March 27th, 2017   . valACYclovir (VALTREX) 500 MG tablet   Oral   Take 1 tablet (500 mg total) by mouth 2 (two) times daily.   30 tablet   0     Allergies No known drug allergies  Family History  Problem Relation Age of Onset  . Cancer Father     colon and prostate  . Diabetes Father   . Breast cancer Neg Hx     Social History Social History  Substance Use Topics  . Smoking status: Never Smoker   . Smokeless tobacco: None  . Alcohol Use: 0.0 oz/week    0 Standard drinks or equivalent per week    Review of Systems  Constitutional: Negative for fever. Eyes: Negative for visual changes. ENT: Negative for sore throat. Cardiovascular: Negative for chest pain. Respiratory: Negative for shortness of breath. Gastrointestinal: Negative for abdominal pain, vomiting and diarrhea. Genitourinary: Negative for dysuria. Musculoskeletal: Negative for back pain. Positive for right knee pain Skin: Negative for rash. Neurological: Negative for headaches, focal weakness or numbness.   10-point ROS otherwise negative.  ____________________________________________   PHYSICAL EXAM:  VITAL  SIGNS: ED Triage Vitals  Enc Vitals Group     BP 03/07/16 0508 143/93 mmHg     Pulse Rate 03/07/16 0508 74     Resp 03/07/16 0508 20     Temp 03/07/16 0508 98.1 F (36.7 C)     Temp Source 03/07/16 0508 Oral     SpO2 03/07/16 0508 98 %     Weight 03/07/16 0508 150 lb (68.04 kg)     Height 03/07/16 0508 5' (1.524 m)     Head Cir --      Peak Flow --      Pain Score 03/07/16 0513 8     Pain Loc --      Pain Edu? --      Excl. in [place]? --      Constitutional: Alert and oriented. Apparent discomfort Eyes: Conjunctivae are normal. PERRL. Normal extraocular movements. ENT   Head: Normocephalic and atraumatic.   Nose: No congestion/rhinnorhea.   Mouth/Throat: Mucous membranes are moist.   Neck: No  stridor. Hematological/Lymphatic/Immunilogical: No cervical lymphadenopathy. Cardiovascular: Normal rate, regular rhythm. Normal and symmetric distal pulses are present in all extremities. No murmurs, rubs, or gallops. Respiratory: Normal respiratory effort without tachypnea nor retractions. Breath sounds are clear and equal bilaterally. No wheezes/rales/rhonchi. Gastrointestinal: Soft and nontender. No distention. There is no CVA tenderness. Genitourinary: deferred Musculoskeletal: Positive right knee anterior drawer test and valgus maneuver. Neurologic:  Normal speech and language. No gross focal neurologic deficits are appreciated. Speech is normal.  Skin:  Skin is warm, dry and intact. No rash noted. Psychiatric: Mood and affect are normal. Speech and behavior are normal. Patient exhibits appropriate insight and judgment.   INITIAL IMPRESSION / ASSESSMENT AND PLAN / ED COURSE  Pertinent labs & imaging results that were available during my care of the patient were reviewed by me and considered in my medical decision making (see chart for details).  History of physical exam concern for possible ligamentous injury of the right knee as such MRI is performed. Dr. Dineen Kid will assume care of the patient at this time.  ____________________________________________   FINAL CLINICAL IMPRESSION(S) / ED DIAGNOSES  Final diagnoses:  Right knee pain      Gregor Hams, MD 03/07/16 [phone]

## 2016-03-07 NOTE — Telephone Encounter (Signed)
Great, thank you all

## 2016-03-07 NOTE — ED Notes (Signed)
Pt presents to ED with right knee pain. Pt reports that it has been hurting slightly over the past several months but pain has increased significantly since Friday. Pt states pain increases regardless if pt is ambulating or sitting still.

## 2016-03-07 NOTE — ED Provider Notes (Signed)
45 year old female with a 10 out of 10 right knee pain over the past several months with acute worsening. No reported injury. Plan is to follow up with the MRI and disposition accordingly. Physical Exam  BP 141/88 mmHg  Pulse 80  Temp(Src) 98.1 F (36.7 C) (Oral)  Resp 15  Ht 5' (1.524 m)  Wt 150 lb (68.04 kg)  BMI 29.30 kg/m2  SpO2 100% ----------------------------------------- 8:34 AM on 03/07/2016 -----------------------------------------   Physical Exam Patient with pain with range of motion of the right knee. ED Course  Procedures  MDM IMPRESSION: Extensive degenerative signal seen in the medial meniscus and there is fraying along the free edge of the body of the medial meniscus but no meniscal or ligament tear is identified.  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  Baker's cyst.   Electronically Signed By: Inge Rise M.D. 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 Patient says that she has used an Ace wrap at home as well as anti-inflammatories including meloxicam which was prescribed by her primary care doctor. Had some relief with Percocet here in the emergency department. We'll give a knee immobilizer as well as Percocet to go home with. Advised patient also keep her leg elevated. She says she has crutches at home. She will need follow-up with orthopedics. She understands plan and is willing to comply. I reviewed the imaging with both her and her family who is at the bedside. She understands plan and is willing to comply.      Orbie Pyo, MD 03/07/16 [phone]

## 2016-03-07 NOTE — ED Notes (Signed)
Tech to place knee immobilizer - Aldona Bar

## 2016-03-07 NOTE — Telephone Encounter (Signed)
Notified the pt of her MRI results, pt states that the pain was so bad that she went to the ED, the ED stated that she had a torn meniscus and bone spuring. Pt states that they are going to refer her to Orthopedic

## 2016-03-09 DIAGNOSIS — K219 Gastro-esophageal reflux disease without esophagitis: Secondary | ICD-10-CM | POA: Insufficient documentation

## 2016-03-09 DIAGNOSIS — M25561 Pain in right knee: Secondary | ICD-10-CM | POA: Insufficient documentation

## 2016-03-09 NOTE — Assessment & Plan Note (Signed)
Nexium refilled  Stable currently Advised healthy diet and exercise

## 2016-03-09 NOTE — Assessment & Plan Note (Signed)
Refilled Valtrex in case of episodic outbreak

## 2016-03-09 NOTE — Assessment & Plan Note (Signed)
New onset X-ray at [facility] outpt imaging  Mobic sent to pharmacy Restart phentermine to help lose weight and to help knees Encouraged healthy diet, exercise - light  FU prn worsening/failure to improve.

## 2016-03-09 NOTE — Assessment & Plan Note (Signed)
Starting phentermine for another 3 month round (3/27) Pt is aware of risks and how to take Has used successfully in the past and without significant side effects.  FU in 3 months

## 2016-03-20 ENCOUNTER — Telehealth: Payer: Self-pay | Admitting: Nurse Practitioner

## 2016-03-20 NOTE — Telephone Encounter (Signed)
Spoke with patient she is requesting a PA on Phentermine, started on cover my meds for her. Awaiting response.

## 2016-03-20 NOTE — Telephone Encounter (Signed)
Pt called stating she needs another referral to the orthopedic. Pt suggested she can go to a good doctor at [facility]. Call pt @ [phone]. Thank you!

## 2016-03-20 NOTE — Telephone Encounter (Signed)
Pt called stating she needs a PA and they are needing a CPT code or ICD 9 code for the medication. Is she talking about a Dx code? Call pt @ [phone]. Thank you!

## 2016-03-20 NOTE — Telephone Encounter (Signed)
Please advise for referral

## 2016-03-21 ENCOUNTER — Other Ambulatory Visit: Payer: Self-pay | Admitting: Nurse Practitioner

## 2016-03-21 DIAGNOSIS — M6751 Plica syndrome, right knee: Secondary | ICD-10-CM

## 2016-03-21 DIAGNOSIS — M9420 Chondromalacia, unspecified site: Secondary | ICD-10-CM

## 2016-03-21 DIAGNOSIS — M7121 Synovial cyst of popliteal space [Baker], right knee: Secondary | ICD-10-CM

## 2016-03-21 NOTE — Telephone Encounter (Signed)
Referral placed.

## 2016-03-23 NOTE — Telephone Encounter (Signed)
PA for Phentermine was denied.  Please advise options?

## 2016-03-23 NOTE — Telephone Encounter (Signed)
Pt also wanted to know if the Rx can be sent to [facility] on [address] Call pt @ [phone]. Thank you!

## 2016-03-26 NOTE — Telephone Encounter (Signed)
Please let Natalie Greene know that her insurance has denied her phentermine- if she would like to check into her pharmacy and see how much it is out of pocket there may be savings cards that she can use. Otherwise, work on diet and exercise.

## 2016-03-26 NOTE — Telephone Encounter (Signed)
I have given denial to you to review. Thanks

## 2016-03-26 NOTE — Telephone Encounter (Signed)
Did it give alternatives?

## 2016-03-26 NOTE — Telephone Encounter (Signed)
Spoke with the patient, she went ahead and paid out of pocket for the phentermine.  She was fine with that.  Thanks she appreciated our efforts in trying to get it covered.

## 2016-03-27 NOTE — Telephone Encounter (Signed)
Thanks

## 2016-04-02 ENCOUNTER — Other Ambulatory Visit: Payer: Self-pay | Admitting: Nurse Practitioner

## 2016-04-02 NOTE — Telephone Encounter (Signed)
Refilled 12/27/15 with two refills. Last seen 02/14/16. Please advise?

## 2016-04-09 ENCOUNTER — Telehealth: Payer: Self-pay | Admitting: Nurse Practitioner

## 2016-04-09 ENCOUNTER — Other Ambulatory Visit: Payer: Self-pay | Admitting: Nurse Practitioner

## 2016-04-09 MED ORDER — FLUCONAZOLE 150 MG PO TABS: 150.0000 mg | ORAL_TABLET | Freq: Once | ORAL | Status: DC

## 2016-04-09 NOTE — Telephone Encounter (Signed)
She can try over the counter monistat for right now. If this is not helpful I will send in a diflucan, but she needs to try this first.

## 2016-04-09 NOTE — Telephone Encounter (Signed)
Spoke with the patient, she has been using the Monistat OTC for the past three days.  Please advise

## 2016-04-09 NOTE — Telephone Encounter (Signed)
Spoke with the patient, she will call if no improvement. thanks

## 2016-04-09 NOTE — Telephone Encounter (Signed)
Pt called in with a possible yeast infection she states that it could have came from a wet bathing suit. Symptoms are itching and burning. Pt would like to know if a antibiotic can be called in? Call pt @ [phone]. Thank you!

## 2016-04-09 NOTE — Telephone Encounter (Signed)
Sent to pharmacy- F/U in office if not improved 48 hrs after taking the pill.

## 2016-04-09 NOTE — Telephone Encounter (Signed)
Would you need to see her?

## 2016-04-11 ENCOUNTER — Ambulatory Visit (INDEPENDENT_AMBULATORY_CARE_PROVIDER_SITE_OTHER): Payer: BLUE CROSS/BLUE SHIELD | Admitting: Nurse Practitioner

## 2016-04-11 ENCOUNTER — Encounter: Payer: Self-pay | Admitting: Nurse Practitioner

## 2016-04-11 ENCOUNTER — Other Ambulatory Visit (HOSPITAL_COMMUNITY)
Admission: RE | Admit: 2016-04-11 | Discharge: 2016-04-11 | Disposition: A | Discharge status: Home / Self Care | Payer: BLUE CROSS/BLUE SHIELD | Source: Ambulatory Visit | Attending: Nurse Practitioner | Admitting: Nurse Practitioner

## 2016-04-11 VITALS — BP 126/88 | HR 88 | Temp 98.2°F | Ht 60.0 in | Wt 200.4 lb

## 2016-04-11 DIAGNOSIS — Z113 Encounter for screening for infections with a predominantly sexual mode of transmission: Secondary | ICD-10-CM | POA: Diagnosis present

## 2016-04-11 DIAGNOSIS — N760 Acute vaginitis: Secondary | ICD-10-CM | POA: Diagnosis not present

## 2016-04-11 LAB — POCT URINALYSIS DIPSTICK
BILIRUBIN UA: NEGATIVE
Blood, UA: NEGATIVE
GLUCOSE UA: NEGATIVE
KETONES UA: NEGATIVE
Nitrite, UA: NEGATIVE
PROTEIN UA: NEGATIVE
Spec Grav, UA: 1.015
Urobilinogen, UA: 0.2
pH, UA: 6.5

## 2016-04-11 MED ORDER — FLUCONAZOLE 150 MG PO TABS: 150.0000 mg | ORAL_TABLET | Freq: Once | ORAL | Status: DC
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
## 2016-04-11 NOTE — Patient Instructions (Signed)
We will contact you about your results once they come in.

## 2016-04-12 ENCOUNTER — Ambulatory Visit: Payer: BLUE CROSS/BLUE SHIELD | Admitting: Nurse Practitioner

## 2016-04-13 LAB — CERVICOVAGINAL ANCILLARY ONLY
CHLAMYDIA, DNA PROBE: NEGATIVE
HPV (WINDOPATH): NOT DETECTED
NEISSERIA GONORRHEA: NEGATIVE
Trichomonas: POSITIVE — AB

## 2016-04-13 LAB — URINE CULTURE: Colony Count: 50000

## 2016-04-15 DIAGNOSIS — N760 Acute vaginitis: Secondary | ICD-10-CM | POA: Insufficient documentation

## 2016-04-15 NOTE — Assessment & Plan Note (Signed)
Checking cervicovaginal swab POCT not convincing for infection Culture being obtained

## 2016-04-16 ENCOUNTER — Telehealth: Payer: Self-pay | Admitting: Nurse Practitioner

## 2016-04-16 LAB — CERVICOVAGINAL ANCILLARY ONLY: HERPES (WINDOWPATH): NEGATIVE

## 2016-04-16 NOTE — Telephone Encounter (Signed)
Want to discuss diagnosis and treatment with patient. Will try again tomorrow

## 2016-04-17 ENCOUNTER — Other Ambulatory Visit: Payer: Self-pay | Admitting: Nurse Practitioner

## 2016-04-17 LAB — CERVICOVAGINAL ANCILLARY ONLY: Candida vaginitis: NEGATIVE

## 2016-04-17 MED ORDER — METRONIDAZOLE 500 MG PO TABS: ORAL_TABLET | ORAL | Status: DC

## 2016-04-17 NOTE — Telephone Encounter (Signed)
Pt. Will be at [phone] today.

## 2016-04-17 NOTE — Telephone Encounter (Signed)
See above note with phone call, thanks

## 2016-04-17 NOTE — Telephone Encounter (Signed)
I have called the patient and discussed treatment and course of action for her diagnosis. She will tell the partner to get treated in [place] (that is where he currently resides). Thanks!

## 2016-04-18 ENCOUNTER — Other Ambulatory Visit: Payer: Self-pay | Admitting: Nurse Practitioner

## 2016-04-18 ENCOUNTER — Telehealth: Payer: Self-pay | Admitting: *Deleted

## 2016-04-18 ENCOUNTER — Telehealth: Payer: Self-pay | Admitting: Nurse Practitioner

## 2016-04-18 MED ORDER — AZITHROMYCIN 1 G PO PACK: 1.0000 g | PACK | Freq: Once | ORAL | Status: DC

## 2016-04-18 NOTE — Telephone Encounter (Signed)
Please advise, if you want me to call her, so where you expecting a call from her again.  I know you talked to her yesterday. thanks

## 2016-04-18 NOTE — Telephone Encounter (Signed)
Spoke with patient regarding further concerns. Questions answered to satisfaction.

## 2016-04-18 NOTE — Telephone Encounter (Signed)
Spoke with pt

## 2016-04-18 NOTE — Telephone Encounter (Signed)
Patient requested a call from [facility] in regards to her records and test results Pt Contact [phone]

## 2016-05-31 ENCOUNTER — Telehealth: Payer: Self-pay | Admitting: Nurse Practitioner

## 2016-05-31 ENCOUNTER — Other Ambulatory Visit: Payer: Self-pay | Admitting: Family Medicine

## 2016-05-31 MED ORDER — FLUCONAZOLE 150 MG PO TABS: 150.0000 mg | ORAL_TABLET | Freq: Once | ORAL | Status: DC

## 2016-05-31 NOTE — Telephone Encounter (Signed)
Pt called about possibly having a yeast infection she is at the beach and has been sitting her bathing suit. Pt needs tier 1 medication for her insurance to pay.  Pharmacy is [facility] [phone]  Call pt @ [phone]. Thank you!

## 2016-05-31 NOTE — Telephone Encounter (Signed)
This is a patient of Carrie's, not in town. Called in May with the same thing, carrie had sent in [place] for her, please advise? No follow up scheduled with any provider?

## 2016-05-31 NOTE — Telephone Encounter (Signed)
Rx was sent  

## 2016-05-31 NOTE — Telephone Encounter (Signed)
Spoke with the patient, thanks

## 2016-06-04 ENCOUNTER — Other Ambulatory Visit: Payer: Self-pay | Admitting: Nurse Practitioner

## 2016-06-05 NOTE — Telephone Encounter (Signed)
Pt is requesting a refill, last filled on 03/05/16. Last OV 04/11/16. Okay to refill?

## 2016-06-21 ENCOUNTER — Other Ambulatory Visit: Payer: Self-pay | Admitting: Nurse Practitioner

## 2016-06-21 NOTE — Telephone Encounter (Signed)
Last refilled 03/05/16 and was last seen on this date as well. Please advise patient does not have any future appointment?

## 2016-06-29 ENCOUNTER — Other Ambulatory Visit: Payer: Self-pay

## 2016-06-29 NOTE — Telephone Encounter (Signed)
Please advise refill, thanks 

## 2016-07-02 ENCOUNTER — Other Ambulatory Visit: Payer: Self-pay | Admitting: Nurse Practitioner

## 2016-07-02 NOTE — Telephone Encounter (Signed)
Please advise on refill.

## 2016-07-04 ENCOUNTER — Encounter: Payer: Self-pay | Admitting: Family Medicine

## 2016-07-04 ENCOUNTER — Ambulatory Visit (INDEPENDENT_AMBULATORY_CARE_PROVIDER_SITE_OTHER): Payer: BLUE CROSS/BLUE SHIELD | Admitting: Family Medicine

## 2016-07-04 DIAGNOSIS — F411 Generalized anxiety disorder: Secondary | ICD-10-CM | POA: Diagnosis not present

## 2016-07-04 MED ORDER — ALPRAZOLAM 1 MG PO TABS
ORAL_TABLET | ORAL | Status: DC
Start: 2016-07-04 — End: 2016-10-09

## 2016-07-04 MED ORDER — PHENTERMINE HCL 37.5 MG PO TABS: 37.5000 mg | ORAL_TABLET | Freq: Every day | ORAL | Status: DC

## 2016-07-04 NOTE — Assessment & Plan Note (Signed)
Somewhat improved. We will continue Xanax at this time. Given return precautions.

## 2016-07-04 NOTE — Patient Instructions (Signed)
Nice to meet you. We will start you back on phentermine for a three-month trial. If you develop any chest pain, shortness of breath, palpitations, increased blood pressure, or any new or changing symptoms please seek medical attention. We will refill your Xanax. If you develop worsening anxiety or depression please seek medical attention.

## 2016-07-04 NOTE — Assessment & Plan Note (Addendum)
Patient's weight has been trending up. Difficulty with exercise given her knee issue though this should be improved with injection in the future. Encouraged exercise. Diet appears relatively good per her report and encouraged her to continue with this. Has responded quite well to phentermine in the past. No cardiac issues. No medications for diabetes. No family history of heart disease. We will trial phentermine for 3 months. If no weight change discussed that we would stop this. She will return in one month to monitor her weight loss and blood pressure. Given return precautions.

## 2016-07-04 NOTE — Progress Notes (Signed)
Pre visit review using our clinic review tool, if applicable. No additional management support is needed unless otherwise documented below in the visit note. 

## 2016-07-04 NOTE — Progress Notes (Signed)
Tommi Rumps, MD Phone: [phone]  Natalie Greene is a 45 y.o. female who presents today for follow-up.  Anxiety: Patient notes still has lots of anxiety. Notes her mind races at night. The Xanax helps her sleep. Has tried multiple other medications in the past with little benefit. Has a lot on her plate with school and work and a blind son. Some intermittent depression. No SI.  GAD 7 : Generalized Anxiety Score 07/04/2016  Nervous, Anxious, on Edge 3  Control/stop worrying 2  Worry too much - different things 2  Trouble relaxing 1  Restless 0  Easily annoyed or irritable 0  Afraid - awful might happen 1  Total GAD 7 Score 9  Anxiety Difficulty Somewhat difficult   Depression screen PHQ 2/9 07/04/2016  Decreased Interest 0  Down, Depressed, Hopeless 0  PHQ - 2 Score 0    Morbid obesity: Patient has intermittently been on phentermine over the last year or so. Not on it recently. Initially lost about 40 pounds with it. Difficulty exercising given a torn meniscus in her right knee for which she is seeing orthopedics and they plan on doing Synvisc injections. Diet seems fairly good with protein with each meal and vegetables with most meals. She has cut out sodas. Weight has continued to trend up. No history of cardiac disease, hypertension, or diabetes requiring medication. No history of palpitations. No family history of heart disease.  PMH: nonsmoker.   ROS see history of present illness  Objective  Physical Exam Filed Vitals:   07/04/16 0833  BP: 126/84  Pulse: 68  Temp: 98.4 F (36.9 C)    BP Readings from Last 3 Encounters:  07/04/16 126/84  04/11/16 126/88  03/07/16 138/82   Wt Readings from Last 3 Encounters:  07/04/16 203 lb 3.2 oz (92.171 kg)  04/11/16 200 lb 6.4 oz (90.901 kg)  03/07/16 150 lb (68.04 kg)    Physical Exam  Constitutional: No distress.  HENT:  Head: Normocephalic and atraumatic.  Cardiovascular: Normal rate, regular rhythm and  normal heart sounds.   Pulmonary/Chest: Effort normal and breath sounds normal.  Neurological: She is alert. Gait normal.  Skin: Skin is warm and dry. She is not diaphoretic.  Psychiatric: Mood and affect normal.     Assessment/Plan: Please see individual problem list.  Severe obesity (BMI >= 40) Patient's weight has been trending up. Difficulty with exercise given her knee issue though this should be improved with injection in the future. Encouraged exercise. Diet appears relatively good per her report and encouraged her to continue with this. Has responded quite well to phentermine in the past. No cardiac issues. No medications for diabetes. No family history of heart disease. We will trial phentermine for 3 months. If no weight change discussed that we would stop this. She will return in one month to monitor her weight loss and blood pressure. Given return precautions.  Generalized anxiety disorder Somewhat improved. We will continue Xanax at this time. Given return precautions.    No orders of the defined types were placed in this encounter.    Meds ordered this encounter  Medications  . phentermine (ADIPEX-P) 37.5 MG tablet    Sig: Take 1 tablet (37.5 mg total) by mouth daily before breakfast.    Dispense:  30 tablet    Refill:  2  . ALPRAZolam (XANAX) 1 MG tablet    Sig: TAKE 1 TABLET BY MOUTH AT BEDTIME AS NEEDED FOR ANXIETY    Dispense:  30 tablet  Refill:  [place], MD [place]

## 2016-08-15 ENCOUNTER — Ambulatory Visit: Payer: BLUE CROSS/BLUE SHIELD | Admitting: Family Medicine

## 2016-08-31 ENCOUNTER — Other Ambulatory Visit: Payer: Self-pay | Admitting: Nurse Practitioner

## 2016-09-04 ENCOUNTER — Other Ambulatory Visit: Payer: Self-pay | Admitting: Nurse Practitioner

## 2016-09-04 DIAGNOSIS — I10 Essential (primary) hypertension: Secondary | ICD-10-CM

## 2016-09-04 MED ORDER — HYDROCHLOROTHIAZIDE 25 MG PO TABS: ORAL_TABLET | ORAL | 1 refills | Status: DC

## 2016-09-04 MED ORDER — ESOMEPRAZOLE MAGNESIUM 40 MG PO CPDR: 40.0000 mg | DELAYED_RELEASE_CAPSULE | Freq: Every day | ORAL | 1 refills | Status: AC

## 2016-09-04 NOTE — Telephone Encounter (Signed)
Refill given. Patient needs to come in for lab work as it has been over a year since her kidney function has been checked.

## 2016-09-04 NOTE — Telephone Encounter (Signed)
Pt is requesting to have her hydrochlorothiazide (HYDRODIURIL) 25 MG tablet and esomeprazole (NEXIUM) 40 MG capsule. She would like to have this sent to a different pharmacy, [facility] on [address] [phone].

## 2016-09-04 NOTE — Telephone Encounter (Signed)
Please schedule patient lab appointment.

## 2016-09-04 NOTE — Telephone Encounter (Signed)
Patient was [facility] patient, seen by MD on 7/17 needs refill will you  authorize for HCTZ and Nexium.

## 2016-09-05 NOTE — Telephone Encounter (Signed)
Called pt and left a vm to call office and schedule a lab appt.

## 2016-09-10 ENCOUNTER — Other Ambulatory Visit (INDEPENDENT_AMBULATORY_CARE_PROVIDER_SITE_OTHER): Payer: Self-pay

## 2016-09-10 DIAGNOSIS — I10 Essential (primary) hypertension: Secondary | ICD-10-CM

## 2016-09-10 LAB — BASIC METABOLIC PANEL
BUN: 13 mg/dL (ref 6–23)
CALCIUM: 8.9 mg/dL (ref 8.4–10.5)
CHLORIDE: 101 meq/L (ref 96–112)
CO2: 30 meq/L (ref 19–32)
CREATININE: 0.77 mg/dL (ref 0.40–1.20)
GFR: 86.06 mL/min (ref >60.00)
GLUCOSE: 123 mg/dL — AB (ref 70–99)
POTASSIUM: 3.7 meq/L (ref 3.5–5.1)
Sodium: 139 mEq/L (ref 135–145)

## 2016-09-11 ENCOUNTER — Telehealth: Payer: Self-pay | Admitting: Family Medicine

## 2016-09-11 NOTE — Telephone Encounter (Signed)
See result note.  

## 2016-09-11 NOTE — Telephone Encounter (Signed)
Pt called returning your call regarding lab results. Thank you!  Call pt @ [phone]

## 2016-09-26 ENCOUNTER — Emergency Department: Payer: Medicaid Other

## 2016-09-26 DIAGNOSIS — G8929 Other chronic pain: Secondary | ICD-10-CM | POA: Insufficient documentation

## 2016-09-26 DIAGNOSIS — M25561 Pain in right knee: Secondary | ICD-10-CM | POA: Diagnosis present

## 2016-09-26 DIAGNOSIS — M7989 Other specified soft tissue disorders: Secondary | ICD-10-CM | POA: Insufficient documentation

## 2016-09-26 DIAGNOSIS — E119 Type 2 diabetes mellitus without complications: Secondary | ICD-10-CM | POA: Diagnosis not present

## 2016-09-26 DIAGNOSIS — Z79899 Other long term (current) drug therapy: Secondary | ICD-10-CM | POA: Diagnosis not present

## 2016-09-26 NOTE — ED Triage Notes (Addendum)
Pt ambulatory to triage without difficulty or distress noted; Pt reports right knee pain x 6 months with swelling; denies any know injury; st pain radiates into front of right leg and is concerned about possible blood clot

## 2016-09-27 ENCOUNTER — Emergency Department
Admission: EM | Admit: 2016-09-27 | Discharge: 2016-09-27 | Disposition: A | Discharge status: Home / Self Care | Payer: Medicaid Other | Attending: Emergency Medicine | Admitting: Emergency Medicine

## 2016-09-27 DIAGNOSIS — M25561 Pain in right knee: Secondary | ICD-10-CM

## 2016-09-27 DIAGNOSIS — M25461 Effusion, right knee: Secondary | ICD-10-CM

## 2016-09-27 MED ORDER — NAPROXEN 500 MG PO TABS
500.0000 mg | ORAL_TABLET | Freq: Two times a day (BID) | ORAL | 0 refills | Status: AC
Start: 2016-09-27 — End: 2016-10-27

## 2016-09-27 NOTE — ED Notes (Signed)
Pt states R knee pain/swelling x months, states CP and SOB x few days. Pt has varicose veins that she states "have been busting." Pt states redness/warmth/swelling to back of R leg and R knee, afraid of DVT. Pt talking fast, in complete sentences.

## 2016-09-27 NOTE — ED Provider Notes (Signed)
[facility] Emergency Department Provider Note  ____________________________________________  Time seen: Approximately 1:37 AM  I have reviewed the triage vital signs and the nursing notes.   HISTORY  Chief Complaint Knee Pain   HPI Natalie Greene is a 45 y.o. female with a history of chronic right knee pain who presents for evaluation of worsening pain and swelling. Patient reports she has had pain in her knee for about 6 months. 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 She has received multiple steroid injections with the last one 4 months ago. She reports that the pain has been getting progressively worse over the course of the last few days and this morning she noticed that her knee was markedly swollen. She is concerned that she might have a blood clot and that's what prompted her visit to the emergency room. She reports no pain at rest but pain with weightbearing it is located in her right knee, constant, nonradiating. She denies any personal or family history blood clots, recent travel or immobilization, history of exogenous hormones, hemoptysis. NO fever, chills, nausea, vomiting.  Past Medical History:  Diagnosis Date  . GERD (gastroesophageal reflux disease)   . Lupus     Patient Active Problem List   Diagnosis Date Noted  . Vaginitis and vulvovaginitis 04/15/2016  . Right knee pain 03/09/2016  . GERD (gastroesophageal reflux disease) 03/09/2016  . Influenza with respiratory manifestation 02/14/2016  . H/O cold sores 06/21/2015  . Diabetes mellitus type II, controlled ([place]) 06/13/2015  . Hypokalemia 05/25/2015  . Encounter to establish care 02/07/2015  . Severe obesity (BMI >= 40) ([place]) 02/07/2015  . Generalized anxiety disorder 02/07/2015    Past Surgical History:  Procedure Laterality Date  . ABDOMINAL HYSTERECTOMY     approx 5  years ago from 2017   . CHOLECYSTECTOMY    . WISDOM TOOTH EXTRACTION      Prior to Admission medications   Medication Sig Start Date End Date Taking? Authorizing Provider  ALPRAZolam Duanne Moron) 1 MG tablet TAKE 1 TABLET BY MOUTH AT BEDTIME AS NEEDED FOR ANXIETY 07/04/16   Leone Haven, MD  esomeprazole (NEXIUM) 40 MG capsule Take 1 capsule (40 mg total) by mouth daily at 12 noon. 09/04/16   Leone Haven, MD  hydrochlorothiazide (HYDRODIURIL) 25 MG tablet TAKE 1 TABLET (25 MG TOTAL) BY MOUTH DAILY. 09/04/16   Leone Haven, MD  naproxen (NAPROSYN) 500 MG tablet Take 1 tablet (500 mg total) by mouth 2 (two) times daily with a meal. 09/27/16 10/27/16  Rudene Re, MD  phentermine (ADIPEX-P) 37.5 MG tablet Take 1 tablet (37.5 mg total) by mouth daily before breakfast. 07/04/16   Leone Haven, MD  valACYclovir (VALTREX) 500 MG tablet TAKE 1 TABLET (500 MG TOTAL) BY MOUTH 2 (TWO) TIMES DAILY. 06/07/16   Coral Spikes, DO    Allergies Review of patient's allergies indicates no known allergies.  Family History  Problem Relation Age of Onset  . Cancer Father     colon and prostate  . Diabetes Father   . Breast cancer Neg Hx     Social History Social History  Substance Use Topics  . Smoking status: Never Smoker  . Smokeless tobacco: Not on file  . Alcohol use 0.0 oz/week    Review of Systems  Constitutional: Negative for fever. Eyes: Negative for visual changes. ENT: Negative for sore throat.  Cardiovascular: Negative for chest pain. Respiratory: Negative for shortness of breath. Gastrointestinal: Negative for abdominal pain, vomiting or diarrhea. Genitourinary: Negative for dysuria. Musculoskeletal: Negative for back pain. + R knee pain and swelling Skin: Negative for rash. Neurological: Negative for headaches, weakness or numbness.  ____________________________________________   PHYSICAL EXAM:  VITAL SIGNS: ED Triage Vitals [09/26/16 2217]  Enc Vitals  Group     BP (!) 167/100     Pulse Rate 83     Resp 18     Temp 98.3 F (36.8 C)     Temp Source Oral     SpO2 98 %     Weight 200 lb (90.7 kg)     Height 5' (1.524 m)     Head Circumference      Peak Flow      Pain Score 2     Pain Loc      Pain Edu?      Excl. in [place]?     Constitutional: Alert and oriented. Well appearing and in no apparent distress. HEENT:      Head: Normocephalic and atraumatic.         Eyes: Conjunctivae are normal. Sclera is non-icteric. EOMI. PERRL      Mouth/Throat: Mucous membranes are moist.       Neck: Supple with no signs of meningismus. Cardiovascular: Regular rate and rhythm. No murmurs, gallops, or rubs. 2+ symmetrical distal pulses are present in all extremities. No JVD. Respiratory: Normal respiratory effort. Lungs are clear to auscultation bilaterally. No wheezes, crackles, or rhonchi.  Musculoskeletal: Swelling of the R knee, no warmth or erythema, full painless ROM, strong distal pulses.  Neurologic: Normal speech and language. Face is symmetric. Moving all extremities. No gross focal neurologic deficits are appreciated. Skin: Skin is warm, dry and intact. No rash noted. Psychiatric: Mood and affect are normal. Speech and behavior are normal.  ____________________________________________   LABS (all labs ordered are listed, but only abnormal results are displayed)  Labs Reviewed - No data to display ____________________________________________  EKG  none ____________________________________________  RADIOLOGY  Doppler: Negative ____________________________________________   PROCEDURES  Procedure(s) performed: None Procedures Critical Care performed:  None ____________________________________________   INITIAL IMPRESSION / ASSESSMENT AND PLAN / ED COURSE   45 y.o. female with a history of chronic right knee pain who presents for evaluation of worsening pain and swelling. The patient is well-appearing, in no distress, her  vital signs are within normal limits, exam showing swelling of the right knee with full painless range of motion, no warmth or erythema, no systemic symptoms concerning for infection. Doppler done and pending results.   Clinical Course  Comment By Time  Doppler studies negative for DVT. We'll discharge patient home with naproxen. Patient has an appointment coming up in a week with her orthopedic surgeon. Discussed return precautions with patient. Rudene Re, MD 10/12 [phone]    Pertinent labs & imaging results that were available during my care of the patient were reviewed by me and considered in my medical decision making (see chart for details).    ____________________________________________   FINAL CLINICAL IMPRESSION(S) / ED DIAGNOSES  Final diagnoses:  Pain and swelling of right knee      NEW MEDICATIONS STARTED DURING THIS VISIT:  New Prescriptions   NAPROXEN (NAPROSYN) 500 MG TABLET    Take 1 tablet (500 mg total) by mouth 2 (two) times daily with a meal.     Note:  This document was prepared using Dragon voice recognition  software and may include unintentional dictation errors.    Rudene Re, MD 09/27/16 [phone]

## 2016-09-28 ENCOUNTER — Ambulatory Visit: Payer: Self-pay | Admitting: Family Medicine

## 2016-09-28 DIAGNOSIS — Z0289 Encounter for other administrative examinations: Secondary | ICD-10-CM

## 2016-10-09 ENCOUNTER — Other Ambulatory Visit: Payer: Self-pay | Admitting: Family Medicine

## 2016-10-10 NOTE — Telephone Encounter (Signed)
Last filled 09/07/16. Last seen 07/04/16. No follow up visit scheduled.

## 2016-10-10 NOTE — Telephone Encounter (Signed)
Refill given. Patient needs follow-up scheduled.

## 2016-10-12 ENCOUNTER — Other Ambulatory Visit: Payer: Self-pay | Admitting: Family Medicine

## 2016-10-16 ENCOUNTER — Telehealth: Payer: Self-pay | Admitting: Family Medicine

## 2016-10-16 NOTE — Telephone Encounter (Signed)
Pt called and stated that she went to pick up her valACYclovir (VALTREX) 500 MG tablet and was to expensive, can we resend to [facility].   [facility], [place], [place] [address] Phone [phone]

## 2016-10-16 NOTE — Telephone Encounter (Signed)
Please advise, I have not heard of that pharmacy?

## 2016-10-16 NOTE — Telephone Encounter (Signed)
Pt notified, follow up scheduled.

## 2016-10-17 MED ORDER — VALACYCLOVIR HCL 500 MG PO TABS: ORAL_TABLET | ORAL | 2 refills | Status: AC

## 2016-10-17 NOTE — Telephone Encounter (Signed)
Sent to [facility].

## 2016-10-17 NOTE — Telephone Encounter (Signed)
Noted, thanks!

## 2016-11-23 ENCOUNTER — Other Ambulatory Visit: Payer: Self-pay | Admitting: Family Medicine

## 2016-12-05 ENCOUNTER — Telehealth: Payer: Self-pay | Admitting: Family Medicine

## 2016-12-05 ENCOUNTER — Ambulatory Visit: Payer: Self-pay | Admitting: Family Medicine

## 2016-12-05 NOTE — Telephone Encounter (Signed)
fyi

## 2016-12-05 NOTE — Telephone Encounter (Signed)
Noted  

## 2016-12-05 NOTE — Telephone Encounter (Signed)
FYI - Pt called and cancelled appt stated that she woke up really sick and needed to cancel.

## 2016-12-26 ENCOUNTER — Ambulatory Visit: Payer: Self-pay | Attending: Oncology

## 2016-12-26 ENCOUNTER — Ambulatory Visit
Admission: RE | Admit: 2016-12-26 | Discharge: 2016-12-26 | Disposition: A | Discharge status: Home / Self Care | Payer: Self-pay | Source: Ambulatory Visit | Attending: Oncology | Admitting: Oncology

## 2016-12-26 VITALS — BP 134/88 | HR 82 | Temp 98.4°F | Ht 61.81 in | Wt 220.2 lb

## 2016-12-26 DIAGNOSIS — Z0000 Encounter for general adult medical examination without abnormal findings: Secondary | ICD-10-CM

## 2016-12-26 NOTE — Progress Notes (Addendum)
Subjective:     Patient ID: Natalie Greene, female   DOB: [birth year], 46 y.o.   MRN: [id number]  HPI   Review of Systems     Objective:   Physical Exam  Pulmonary/Chest: Right breast exhibits no inverted nipple, no mass, no nipple discharge, no skin change and no tenderness. Left breast exhibits no inverted nipple, no mass, no nipple discharge, no skin change and no tenderness. Breasts are symmetrical.       Assessment:     46 year old female  patient presents for [facility] clinic visit.  Patient screened, and meets BCCCP eligibility.  Patient does not have insurance, Medicare or Medicaid.  Handout given on Affordable Care Act.  Instructed patient on breast self-exam using teach back method.  CBE unremarkable.  No mass or lump palpated.  Patient reports she has lost 40 pounds , and gained 20 pounds of that back over last year.  She is in nursing school, and works in Passenger transport manager at [employer].    Plan:     Sent for bilateral screening mammogram.

## 2016-12-27 ENCOUNTER — Other Ambulatory Visit: Payer: Self-pay

## 2016-12-27 DIAGNOSIS — N63 Unspecified lump in unspecified breast: Secondary | ICD-10-CM

## 2017-01-04 ENCOUNTER — Ambulatory Visit
Admission: RE | Admit: 2017-01-04 | Discharge: 2017-01-04 | Disposition: A | Discharge status: Home / Self Care | Payer: Medicaid Other | Source: Ambulatory Visit | Attending: Oncology | Admitting: Oncology

## 2017-01-04 DIAGNOSIS — N63 Unspecified lump in unspecified breast: Secondary | ICD-10-CM

## 2017-01-07 NOTE — Progress Notes (Signed)
Letter mailed from [facility] to notify of normal mammogram results.  Patient to return in one year for annual screening.  Copy to [facility]. 

## 2017-04-15 ENCOUNTER — Other Ambulatory Visit: Payer: Self-pay | Admitting: Family Medicine

## 2017-06-25 ENCOUNTER — Emergency Department: Payer: Medicaid Other

## 2017-06-25 ENCOUNTER — Emergency Department
Admission: EM | Admit: 2017-06-25 | Discharge: 2017-06-25 | Disposition: A | Discharge status: Home / Self Care | Payer: Medicaid Other | Attending: Emergency Medicine | Admitting: Emergency Medicine

## 2017-06-25 DIAGNOSIS — Z79899 Other long term (current) drug therapy: Secondary | ICD-10-CM | POA: Insufficient documentation

## 2017-06-25 DIAGNOSIS — E119 Type 2 diabetes mellitus without complications: Secondary | ICD-10-CM | POA: Insufficient documentation

## 2017-06-25 DIAGNOSIS — R0789 Other chest pain: Secondary | ICD-10-CM | POA: Insufficient documentation

## 2017-06-25 DIAGNOSIS — R159 Full incontinence of feces: Secondary | ICD-10-CM | POA: Insufficient documentation

## 2017-06-25 HISTORY — DX: Reserved for concepts with insufficient information to code with codable children: IMO0002

## 2017-06-25 HISTORY — DX: Malignant (primary) neoplasm, unspecified: C80.1

## 2017-06-25 LAB — CBC
HCT: 38.6 % (ref 35.0–47.0)
Hemoglobin: 13.4 g/dL (ref 12.0–16.0)
MCH: 29.8 pg (ref 26.0–34.0)
MCHC: 34.7 g/dL (ref 32.0–36.0)
MCV: 85.9 fL (ref 80.0–100.0)
PLATELETS: 266 10*3/uL (ref 150–440)
RBC: 4.49 MIL/uL (ref 3.80–5.20)
RDW: 13.1 % (ref 11.5–14.5)
WBC: 9.1 10*3/uL (ref 3.6–11.0)

## 2017-06-25 LAB — TROPONIN I: Troponin I: <0.03 ng/mL (ref <0.03)

## 2017-06-25 LAB — BASIC METABOLIC PANEL
Anion gap: 8 (ref 5–15)
BUN: 14 mg/dL (ref 6–20)
CHLORIDE: 103 mmol/L (ref 101–111)
CO2: 28 mmol/L (ref 22–32)
CREATININE: 0.72 mg/dL (ref 0.44–1.00)
Calcium: 9.3 mg/dL (ref 8.9–10.3)
GFR calc Af Amer: >60 mL/min (ref >60)
GFR calc non Af Amer: >60 mL/min (ref >60)
Glucose, Bld: 141 mg/dL — ABNORMAL HIGH (ref 65–99)
Potassium: 3.4 mmol/L — ABNORMAL LOW (ref 3.5–5.1)
Sodium: 139 mmol/L (ref 135–145)

## 2017-06-25 NOTE — ED Provider Notes (Signed)
[facility] Emergency Department Provider Note  ____________________________________________   First MD Initiated Contact with Patient 06/25/17 1909     (approximate)  I have reviewed the triage vital signs and the nursing notes.   HISTORY  Chief Complaint Chest Pain    HPI Natalie Greene is a 46 y.o. female who comes to the emergency department with multiple issues. She has had intermittent episodes of sharp atypical nonradiating nonexertional chest pain. Her cousin is a Marine scientist and is worried that she might have congestive heart failure. She sleeps on 2 pillows but this is unchanged. She has no leg swelling. Her primary concern today is not chest pain but actually fecal incontinence. For the past month she is had multiple episodes of fecal incontinence. She was driving to work today when all of a sudden she noted a foul smell and she noted that her pants were full of feces.This is the third or fourth time it happened. She doesn't notice an urgent all of a sudden she just defecates. She has never had anal receptive intercourse and she is never used anal sex toys. She has no back pain. No fevers or chills. No leg weakness or numbness. No saddle anesthesia.   Past Medical History:  Diagnosis Date  . Cancer ([place])   . GERD (gastroesophageal reflux disease)   . Lupus   . Squamous cell carcinoma     Patient Active Problem List   Diagnosis Date Noted  . Vaginitis and vulvovaginitis 04/15/2016  . Right knee pain 03/09/2016  . GERD (gastroesophageal reflux disease) 03/09/2016  . Influenza with respiratory manifestation 02/14/2016  . H/O cold sores 06/21/2015  . Diabetes mellitus type II, controlled ([place]) 06/13/2015  . Hypokalemia 05/25/2015  . Encounter to establish care 02/07/2015  . Severe obesity (BMI >= 40) ([place]) 02/07/2015  . Generalized anxiety disorder 02/07/2015    Past Surgical History:  Procedure Laterality Date  . ABDOMINAL HYSTERECTOMY     approx 5 years ago from 2017   . CHOLECYSTECTOMY    . WISDOM TOOTH EXTRACTION      Prior to Admission medications   Medication Sig Start Date End Date Taking? Authorizing Provider  ALPRAZolam Duanne Moron) 1 MG tablet TAKE ONE TABLET BY MOUTH AT BEDTIME AS NEEDED FOR ANXIETY 10/10/16   Leone Haven, MD  esomeprazole (NEXIUM) 40 MG capsule Take 1 capsule (40 mg total) by mouth daily at 12 noon. 09/04/16   Leone Haven, MD  hydrochlorothiazide (HYDRODIURIL) 25 MG tablet TAKE ONE TABLET BY MOUTH DAILY. 04/15/17   Leone Haven, MD  phentermine (ADIPEX-P) 37.5 MG tablet Take 1 tablet (37.5 mg total) by mouth daily before breakfast. 07/04/16   Leone Haven, MD  valACYclovir (VALTREX) 500 MG tablet TAKE 1 TABLET (500 MG TOTAL) BY MOUTH 2 (TWO) TIMES DAILY. 10/17/16   Leone Haven, MD    Allergies Patient has no known allergies.  Family History  Problem Relation Age of Onset  . Cancer Father        colon and prostate  . Diabetes Father   . Breast cancer Other     Social History Social History  Substance Use Topics  . Smoking status: Never Smoker  . Smokeless tobacco: Never Used  . Alcohol use 0.0 oz/week    Review of Systems Constitutional: No fever/chills Eyes: No visual changes. ENT: No sore throat. Cardiovascular: Positive chest pain. Respiratory: Denies shortness of breath. Gastrointestinal: No abdominal pain.  No nausea, no vomiting.  No  diarrhea.  No constipation. Genitourinary: Negative for dysuria. Musculoskeletal: Negative for back pain. Skin: Negative for rash. Neurological: Negative for headaches, focal weakness or numbness.   ____________________________________________   PHYSICAL EXAM:  VITAL SIGNS: ED Triage Vitals  Enc Vitals Group     BP 06/25/17 1801 (!) 164/94     Pulse Rate 06/25/17 1800 60     Resp 06/25/17 1800 16     Temp 06/25/17 1800 98.4 F (36.9 C)     Temp Source 06/25/17 1800 Oral     SpO2 06/25/17 1800 97 %      Weight 06/25/17 1801 200 lb (90.7 kg)     Height 06/25/17 1801 5' (1.524 m)     Head Circumference --      Peak Flow --      Pain Score 06/25/17 1800 0     Pain Loc --      Pain Edu? --      Excl. in [place]? --     Constitutional: Alert and oriented 4 well appearing nontoxic no diaphoresis speaks in full clear sentences Eyes: PERRL EOMI. Head: Atraumatic. Nose: No congestion/rhinnorhea. Mouth/Throat: No trismus Neck: No stridor.   Cardiovascular: Normal rate, regular rhythm. Grossly normal heart sounds.  Good peripheral circulation. Respiratory: Normal respiratory effort.  No retractions. Lungs CTAB and moving good air Gastrointestinal: Soft nontender Rectal exam chaperoned by female nurse Mikeal Hawthorne: Normal anus good tone and normal perianal sensation Musculoskeletal: No lower extremity edema   Neurologic:  Normal speech and language. No gross focal neurologic deficits are appreciated. 5 out of 5 hip flexion and hip extension plantar flexion dorsiflexion 2+ DTRs no ankle clonus ambulates with steady gait Skin:  Skin is warm, dry and intact. No rash noted. Psychiatric: Mood and affect are normal. Speech and behavior are normal.    ____________________________________________   LABS (all labs ordered are listed, but only abnormal results are displayed)  Labs Reviewed  BASIC METABOLIC PANEL - Abnormal; Notable for the following:       Result Value   Potassium 3.4 (*)    Glucose, Bld 141 (*)    All other components within normal limits  CBC  TROPONIN I    No signs of acute ischemia __________________________________________  EKG  ED ECG REPORT I, Darel Hong, the attending physician, personally viewed and interpreted this ECG.  Date: 06/25/2017 EKG Time:  Rate: 59 Rhythm: Sinus bradycardia QRS Axis: Leftward Intervals: normal ST/T Wave abnormalities: normal Narrative Interpretation: Abnormal _____________________________________  RADIOLOGY  Chest x-ray with no  acute disease ____________________________________________   PROCEDURES  Procedure(s) performed: no  Procedures  Critical Care performed: no  Observation: no ____________________________________________   INITIAL IMPRESSION / ASSESSMENT AND PLAN / ED COURSE  Pertinent labs & imaging results that were available during my care of the patient were reviewed by me and considered in my medical decision making (see chart for details).  The patient's chest pain is atypical and she has a normal EKG and normal chest x-ray as well as troponin negative. She is not in heart failure. More concerning is the fecal incontinence. She has no red flags for low back pain and I doubt cauda equina. Unclear etiology of her fecal incontinence pill refer her to gastroenterology outpatient. Strict return precautions given and she is discharged home in stable condition.      ____________________________________________   FINAL CLINICAL IMPRESSION(S) / ED DIAGNOSES  Final diagnoses:  Atypical chest pain  Incontinence of feces, unspecified fecal incontinence type  NEW MEDICATIONS STARTED DURING THIS VISIT:  Discharge Medication List as of 06/25/2017  8:26 PM       Note:  This document was prepared using Dragon voice recognition software and may include unintentional dictation errors.     Darel Hong, MD 06/25/17 2303

## 2017-06-25 NOTE — Discharge Instructions (Signed)
Please make an appointment to follow-up with your primary care physician later on this week for recheck. I also recommend you make an appointment to follow-up with gastroenterology to fully evaluate your fecal incontinence. Return to the emergency department for any concerns.  It was a pleasure to take care of you today, and thank you for coming to our emergency department.  If you have any questions or concerns before leaving please ask the nurse to grab me and I'm more than happy to go through your aftercare instructions again.  If you were prescribed any opioid pain medication today such as Norco, Vicodin, Percocet, morphine, hydrocodone, or oxycodone please make sure you do not drive when you are taking this medication as it can alter your ability to drive safely.  If you have any concerns once you are home that you are not improving or are in fact getting worse before you can make it to your follow-up appointment, please do not hesitate to call 911 and come back for further evaluation.  Darel Hong MD  Results for orders placed or performed during the hospital encounter of 38/45/36  Basic metabolic panel  Result Value Ref Range   Sodium 139 135 - 145 mmol/L   Potassium 3.4 (L) 3.5 - 5.1 mmol/L   Chloride 103 101 - 111 mmol/L   CO2 28 22 - 32 mmol/L   Glucose, Bld 141 (H) 65 - 99 mg/dL   BUN 14 6 - 20 mg/dL   Creatinine, Ser 0.72 0.44 - 1.00 mg/dL   Calcium 9.3 8.9 - 10.3 mg/dL   GFR calc non Af Amer >60 >60 mL/min   GFR calc Af Amer >60 >60 mL/min   Anion gap 8 5 - 15  CBC  Result Value Ref Range   WBC 9.1 3.6 - 11.0 K/uL   RBC 4.49 3.80 - 5.20 MIL/uL   Hemoglobin 13.4 12.0 - 16.0 g/dL   HCT 38.6 35.0 - 47.0 %   MCV 85.9 80.0 - 100.0 fL   MCH 29.8 26.0 - 34.0 pg   MCHC 34.7 32.0 - 36.0 g/dL   RDW 13.1 11.5 - 14.5 %   Platelets 266 150 - 440 K/uL  Troponin I  Result Value Ref Range   Troponin I <0.03 <0.03 ng/mL   Dg Chest 2 View  Result Date: 06/25/2017 CLINICAL DATA:   Patient reports central CP, SOB and numbness in right hand x1 week. Hx HTN. Non-smoker. EXAM: CHEST  2 VIEW COMPARISON:  Chest x-ray dated 11/16/2013. FINDINGS: The heart size and mediastinal contours are within normal limits. Both lungs are clear. The visualized skeletal structures are unremarkable. IMPRESSION: No active cardiopulmonary disease. No evidence of pneumonia or pulmonary edema. Electronically Signed   By: Franki Cabot M.D.   On: 06/25/2017 18:27

## 2017-06-25 NOTE — ED Triage Notes (Signed)
Pt c/o chest pain with SOB for the past week, pt states today she had loss of control of bowels x1. Pt also c/o intermittent numbness to the right hand for the past month or more.

## 2017-12-26 ENCOUNTER — Other Ambulatory Visit: Payer: Self-pay | Admitting: Family Medicine

## 2017-12-26 DIAGNOSIS — Z1231 Encounter for screening mammogram for malignant neoplasm of breast: Secondary | ICD-10-CM

## 2018-01-13 ENCOUNTER — Ambulatory Visit
Admission: RE | Admit: 2018-01-13 | Discharge: 2018-01-13 | Disposition: A | Discharge status: Home / Self Care | Payer: BLUE CROSS/BLUE SHIELD | Source: Ambulatory Visit | Attending: Family Medicine | Admitting: Family Medicine

## 2018-01-13 DIAGNOSIS — Z1231 Encounter for screening mammogram for malignant neoplasm of breast: Secondary | ICD-10-CM | POA: Insufficient documentation

## 2018-05-02 ENCOUNTER — Ambulatory Visit (INDEPENDENT_AMBULATORY_CARE_PROVIDER_SITE_OTHER): Payer: Self-pay | Admitting: Orthopaedic Surgery

## 2018-05-09 ENCOUNTER — Encounter (INDEPENDENT_AMBULATORY_CARE_PROVIDER_SITE_OTHER): Payer: Self-pay | Admitting: Orthopaedic Surgery

## 2018-05-09 ENCOUNTER — Ambulatory Visit (INDEPENDENT_AMBULATORY_CARE_PROVIDER_SITE_OTHER): Payer: BLUE CROSS/BLUE SHIELD

## 2018-05-09 ENCOUNTER — Ambulatory Visit (INDEPENDENT_AMBULATORY_CARE_PROVIDER_SITE_OTHER): Payer: BLUE CROSS/BLUE SHIELD | Admitting: Orthopaedic Surgery

## 2018-05-09 DIAGNOSIS — G8929 Other chronic pain: Secondary | ICD-10-CM | POA: Diagnosis not present

## 2018-05-09 DIAGNOSIS — M25561 Pain in right knee: Secondary | ICD-10-CM

## 2018-05-09 MED ORDER — METHYLPREDNISOLONE ACETATE 40 MG/ML IJ SUSP
40.0000 mg | INTRAMUSCULAR | Status: AC | PRN
  Administered 2018-05-09: 40 mg via INTRA_ARTICULAR

## 2018-05-09 MED ORDER — LIDOCAINE HCL 1 % IJ SOLN
2.0000 mL | INTRAMUSCULAR | Status: AC | PRN
  Administered 2018-05-09: 2 mL

## 2018-05-09 MED ORDER — BUPIVACAINE HCL 0.25 % IJ SOLN
2.0000 mL | INTRAMUSCULAR | Status: AC | PRN
  Administered 2018-05-09: 2 mL via INTRA_ARTICULAR

## 2018-05-09 NOTE — Progress Notes (Signed)
Office Visit Note   Patient: Natalie Greene           Date of Birth: [birth year]           MRN: [id number] Visit Date: 05/09/2018              Requested by: Leone Haven, MD [address] [place], [place] [address] PCP: Leone Haven, MD   Assessment & Plan: Visit Diagnoses:  1. Chronic pain of right knee     Plan: Impression is primary localized osteoarthritis medial compartment with associated medial meniscus tear.  This point, we will repeat the cortisone injection and go ahead and obtain a new MRI of the right knee to assess all compartments and to see if she would be a potential candidate for a medial compartment replacement.  She will follow-up with [place] once this has been completed.  In the meantime, she will continue with her water aerobics.  Follow-Up Instructions: Return in about 2 weeks (around 05/23/2018) for after MRI right knee.   Orders:  Orders Placed This Encounter  Procedures  . Large Joint Inj: R knee  . XR KNEE 3 VIEW RIGHT  . MR Knee Right w/o contrast   No orders of the defined types were placed in this encounter.     Procedures: Large Joint Inj: R knee on 05/09/2018 9:07 AM Indications: pain Details: 22 G needle, anterolateral approach Medications: 2 mL lidocaine 1 %; 2 mL bupivacaine 0.25 %; 40 mg methylPREDNISolone acetate 40 MG/ML      Clinical Data: No additional findings.   Subjective: Chief Complaint  Patient presents with  . Right Knee - Pain    HPI patient is a pleasant 47 year old female who presents to our clinic today with continued right knee pain.  This is been ongoing for the past 2 years and has not gotten any better.  It all started when she was put on Fen/Phen for weight loss by her PCP.  She increased her exercise and started doing squats.  She then noticed pain to the medial aspect of her knee.  She is complaining of a constant ache with associated catching.  Stairs seem to bother her the most but she also  has pain with weightbearing.  She has taken Mobic with minimal relief of symptoms.  She has been seen by Dr. Magdalene Patricia where she has had 3 cortisone injections.  The last one was approximately 4 months ago and gave her about 1 months relief.  She is also had a Synvisc injection without relief of symptoms.  She does have a remote MRI from March 2017.  This showed a medial meniscus tear and moderate degenerative changes medial compartment.  No previous surgical intervention.  Review of Systems as detailed in HPI.  All others reviewed and are negative.   Objective: Vital Signs: There were no vitals taken for this visit.  Physical Exam well-developed well-nourished female in no acute distress.  Alert and oriented x3.  BMI of 39.1.  Ortho Exam examination of her right knee reveals a trace effusion.  Range of motion 0 to 125 degrees.  She does have tenderness medial joint line as well as the Pez bursa.  1+ patellofemoral crepitus.  She is stable valgus and varus stress.  She is neurovascularly intact distally.  Specialty Comments:  No specialty comments available.  Imaging: Xr Knee 3 View Right  Result Date: 05/09/2018 Moderate degenerative changes medial compartment    PMFS History: Patient Active  Problem List   Diagnosis Date Noted  . Vaginitis and vulvovaginitis 04/15/2016  . Right knee pain 03/09/2016  . GERD (gastroesophageal reflux disease) 03/09/2016  . Influenza with respiratory manifestation 02/14/2016  . H/O cold sores 06/21/2015  . Diabetes mellitus type II, controlled ([place]) 06/13/2015  . Hypokalemia 05/25/2015  . Encounter to establish care 02/07/2015  . Severe obesity (BMI >= 40) ([place]) 02/07/2015  . Generalized anxiety disorder 02/07/2015   Past Medical History:  Diagnosis Date  . Cancer ([place])   . GERD (gastroesophageal reflux disease)   . Lupus ([place])   . Squamous cell carcinoma     Family History  Problem Relation Age of Onset  . Cancer Father        colon and  prostate  . Diabetes Father   . Breast cancer Other     Past Surgical History:  Procedure Laterality Date  . ABDOMINAL HYSTERECTOMY     approx 5 years ago from 2017   . CHOLECYSTECTOMY    . WISDOM TOOTH EXTRACTION     Social History   Occupational History  . Not on file  Tobacco Use  . Smoking status: Never Smoker  . Smokeless tobacco: Never Used  Substance and Sexual Activity  . Alcohol use: Yes    Alcohol/week: 0.0 oz  . Drug use: No  . Sexual activity: Not Currently    Partners: Male

## 2018-07-09 ENCOUNTER — Encounter (INDEPENDENT_AMBULATORY_CARE_PROVIDER_SITE_OTHER): Payer: Self-pay | Admitting: Radiology

## 2018-07-19 ENCOUNTER — Ambulatory Visit
Admission: RE | Admit: 2018-07-19 | Discharge: 2018-07-19 | Disposition: A | Discharge status: Home / Self Care | Payer: BLUE CROSS/BLUE SHIELD | Source: Ambulatory Visit | Attending: Physician Assistant | Admitting: Physician Assistant

## 2018-07-19 DIAGNOSIS — M25561 Pain in right knee: Principal | ICD-10-CM

## 2018-07-19 DIAGNOSIS — G8929 Other chronic pain: Secondary | ICD-10-CM

## 2018-07-21 NOTE — Progress Notes (Signed)
Fu appt once xu back in office

## 2018-07-24 ENCOUNTER — Ambulatory Visit (INDEPENDENT_AMBULATORY_CARE_PROVIDER_SITE_OTHER): Payer: BLUE CROSS/BLUE SHIELD | Admitting: Physician Assistant

## 2018-07-25 ENCOUNTER — Ambulatory Visit (INDEPENDENT_AMBULATORY_CARE_PROVIDER_SITE_OTHER): Payer: BLUE CROSS/BLUE SHIELD | Admitting: Orthopaedic Surgery

## 2018-07-25 ENCOUNTER — Encounter (INDEPENDENT_AMBULATORY_CARE_PROVIDER_SITE_OTHER): Payer: Self-pay | Admitting: Orthopaedic Surgery

## 2018-07-25 DIAGNOSIS — M1711 Unilateral primary osteoarthritis, right knee: Secondary | ICD-10-CM

## 2018-07-25 NOTE — Progress Notes (Signed)
   Office Visit Note   Patient: Natalie Greene           Date of Birth: 11-23-1971           MRN: [id number] Visit Date: 07/25/2018              Requested by: Leone Haven, MD [address] [place], [place] [address] PCP: Leone Haven, MD   Assessment & Plan: Visit Diagnoses:  1. Unilateral primary osteoarthritis, right knee     Plan: MRI findings are consistent with advanced degenerative disease of the medial compartment with extrusion of the medial meniscus and a meniscal root tear which has rendered the meniscus to be nonfunctional.  She has advanced chondromalacia in the medial compartment with degenerative findings.  She has minimal degenerative disease elsewhere.  These findings were discussed with the patient and overall we reviewed treatment options including a partial knee replacement, continue conservative treatment, weight loss, diet exercise, strengthening.  She has had good relief from previous cortisone injections.  She is actively trying to lose weight and she has lost 50 pounds recently.  We discussed the risks and benefits of a partial knee replacement and rehab and recovery.  She will think about this and let us know.  Follow-Up Instructions: Return if symptoms worsen or fail to improve.   Orders:  No orders of the defined types were placed in this encounter.  No orders of the defined types were placed in this encounter.     Procedures: No procedures performed   Clinical Data: No additional findings.   Subjective: Chief Complaint  Patient presents with  . Right Knee - Pain    [facility] follows up today for MRI review.  She complains mainly of medial sided knee pain.   Review of Systems   Objective: Vital Signs: There were no vitals taken for this visit.  Physical Exam  Ortho Exam Right knee exam is stable. Specialty Comments:  No specialty comments available.  Imaging: No results found.   PMFS History: Patient Active  Problem List   Diagnosis Date Noted  . Vaginitis and vulvovaginitis 04/15/2016  . Right knee pain 03/09/2016  . GERD (gastroesophageal reflux disease) 03/09/2016  . Influenza with respiratory manifestation 02/14/2016  . H/O cold sores 06/21/2015  . Diabetes mellitus type II, controlled ([place]) 06/13/2015  . Hypokalemia 05/25/2015  . Encounter to establish care 02/07/2015  . Severe obesity (BMI >= 40) ([place]) 02/07/2015  . Generalized anxiety disorder 02/07/2015   Past Medical History:  Diagnosis Date  . Cancer ([place])   . GERD (gastroesophageal reflux disease)   . Lupus ([place])   . Squamous cell carcinoma     Family History  Problem Relation Age of Onset  . Cancer Father        colon and prostate  . Diabetes Father   . Breast cancer Other     Past Surgical History:  Procedure Laterality Date  . ABDOMINAL HYSTERECTOMY     approx 5 years ago from 2017   . CHOLECYSTECTOMY    . WISDOM TOOTH EXTRACTION     Social History   Occupational History  . Not on file  Tobacco Use  . Smoking status: Never Smoker  . Smokeless tobacco: Never Used  Substance and Sexual Activity  . Alcohol use: Yes    Alcohol/week: 0.0 standard drinks  . Drug use: No  . Sexual activity: Not Currently    Partners: Male

## 2018-08-28 ENCOUNTER — Telehealth (INDEPENDENT_AMBULATORY_CARE_PROVIDER_SITE_OTHER): Payer: Self-pay | Admitting: Orthopaedic Surgery

## 2018-08-28 NOTE — Telephone Encounter (Signed)
Patient would like another cortisone injection in her right knee, currently scheduled for 9/17. She said Dr. Erlinda Hong had mentioned synvisc one for her as well, can she have this once submitted/approved by her insurance or does she have to wait a certain amount of time in between? Please advise # [phone]

## 2018-08-28 NOTE — Telephone Encounter (Signed)
See message below.  She has scheduled appt 09/02/18.

## 2018-08-29 ENCOUNTER — Telehealth (INDEPENDENT_AMBULATORY_CARE_PROVIDER_SITE_OTHER): Payer: Self-pay

## 2018-08-29 NOTE — Telephone Encounter (Signed)
Yes please submit for synvsic

## 2018-08-29 NOTE — Telephone Encounter (Signed)
Please Submit for Synvisc. Patient will have to probably Cx appt. Not sure if you can get it approved before 09/02/18.

## 2018-08-29 NOTE — Telephone Encounter (Signed)
Noted. I will try

## 2018-08-29 NOTE — Telephone Encounter (Signed)
Submitted VOB for SynviscOne, right knee. 

## 2018-09-02 ENCOUNTER — Ambulatory Visit (INDEPENDENT_AMBULATORY_CARE_PROVIDER_SITE_OTHER): Payer: BLUE CROSS/BLUE SHIELD | Admitting: Orthopaedic Surgery

## 2018-09-02 ENCOUNTER — Encounter (INDEPENDENT_AMBULATORY_CARE_PROVIDER_SITE_OTHER): Payer: Self-pay | Admitting: Orthopaedic Surgery

## 2018-09-02 ENCOUNTER — Telehealth (INDEPENDENT_AMBULATORY_CARE_PROVIDER_SITE_OTHER): Payer: Self-pay

## 2018-09-02 DIAGNOSIS — M1711 Unilateral primary osteoarthritis, right knee: Secondary | ICD-10-CM

## 2018-09-02 MED ORDER — LIDOCAINE HCL 1 % IJ SOLN
2.0000 mL | INTRAMUSCULAR | Status: AC | PRN
  Administered 2018-09-02: 2 mL

## 2018-09-02 MED ORDER — BUPIVACAINE HCL 0.25 % IJ SOLN
2.0000 mL | INTRAMUSCULAR | Status: AC | PRN
  Administered 2018-09-02: 2 mL via INTRA_ARTICULAR

## 2018-09-02 MED ORDER — METHYLPREDNISOLONE ACETATE 40 MG/ML IJ SUSP
40.0000 mg | INTRAMUSCULAR | Status: AC | PRN
  Administered 2018-09-02: 40 mg via INTRA_ARTICULAR

## 2018-09-02 NOTE — Telephone Encounter (Signed)
Resubmitted for VOB for SynviscOne, right knee due to insurance information being changed.

## 2018-09-02 NOTE — Progress Notes (Signed)
Office Visit Note   Patient: Natalie Greene           Date of Birth: Jul 16, 1971           MRN: [id number] Visit Date: 09/02/2018              Requested by: Leone Haven, MD [address] [place], [place] [address] PCP: Leone Haven, MD   Assessment & Plan: Visit Diagnoses:  1. Unilateral primary osteoarthritis, right knee     Plan: Impression is primary localized osteoarthritis right knee medial compartment.  Today, we will inject her right knee with cortisone.  We will also send for Visco supplementation approval.  She will follow-up with [place] once this is been approved.  She is still thinking about proceeding with partial knee replacement but would like to put this off for now.    Follow-Up Instructions: Return if symptoms worsen or fail to improve.   Orders:  Orders Placed This Encounter  Procedures  . Large Joint Inj: R knee   No orders of the defined types were placed in this encounter.     Procedures: Large Joint Inj: R knee on 09/02/2018 8:46 AM Indications: pain Details: 22 G needle, anterolateral approach Medications: 2 mL lidocaine 1 %; 2 mL bupivacaine 0.25 %; 40 mg methylPREDNISolone acetate 40 MG/ML      Clinical Data: No additional findings.   Subjective: Chief Complaint  Patient presents with  . Right Knee - Pain    HPI patient is a pleasant 47 year old female who presents to our clinic today with continued right knee pain.  Recent MRI showed moderate medial compartment osteoarthritis with an extruded medial meniscus tear.  She has had good relief with cortisone injections in the past which was last done little over 3 months ago.  She is also had a previous viscosupplementation injection along time ago which was of no relief.  At her last appointment, we discussed partial knee replacement.  She would like to put this off for as long as possible.  She would like to proceed with cortisone injection today.  Review of Systems as  detailed in HPI.  All others reviewed and are negative.   Objective: Vital Signs: There were no vitals taken for this visit.  Physical Exam well-developed well-nourished female no acute distress.  Alert and oriented x3.  Ortho Exam stable knee exam  Specialty Comments:  No specialty comments available.  Imaging: No new imaging   PMFS History: Patient Active Problem List   Diagnosis Date Noted  . Vaginitis and vulvovaginitis 04/15/2016  . Right knee pain 03/09/2016  . GERD (gastroesophageal reflux disease) 03/09/2016  . Influenza with respiratory manifestation 02/14/2016  . H/O cold sores 06/21/2015  . Diabetes mellitus type II, controlled ([place]) 06/13/2015  . Hypokalemia 05/25/2015  . Encounter to establish care 02/07/2015  . Severe obesity (BMI >= 40) ([place]) 02/07/2015  . Generalized anxiety disorder 02/07/2015   Past Medical History:  Diagnosis Date  . Cancer ([place])   . GERD (gastroesophageal reflux disease)   . Lupus ([place])   . Squamous cell carcinoma     Family History  Problem Relation Age of Onset  . Cancer Father        colon and prostate  . Diabetes Father   . Breast cancer Other     Past Surgical History:  Procedure Laterality Date  . ABDOMINAL HYSTERECTOMY     approx 5 years ago from 2017   . CHOLECYSTECTOMY    .  WISDOM TOOTH EXTRACTION     Social History   Occupational History  . Not on file  Tobacco Use  . Smoking status: Never Smoker  . Smokeless tobacco: Never Used  Substance and Sexual Activity  . Alcohol use: Yes    Alcohol/week: 0.0 standard drinks  . Drug use: No  . Sexual activity: Not Currently    Partners: Male

## 2018-09-05 ENCOUNTER — Telehealth (INDEPENDENT_AMBULATORY_CARE_PROVIDER_SITE_OTHER): Payer: Self-pay

## 2018-09-05 NOTE — Telephone Encounter (Signed)
Called and left patient a VM to return my call concerning member ID# with [place].  Advised per MySynviscOne portal that she is missing a number for her member ID.

## 2018-09-15 ENCOUNTER — Telehealth (INDEPENDENT_AMBULATORY_CARE_PROVIDER_SITE_OTHER): Payer: Self-pay

## 2018-09-15 NOTE — Telephone Encounter (Signed)
Talked with patient and she provided me with the correct Member ID# for [facility] [id number]

## 2018-09-26 ENCOUNTER — Telehealth (INDEPENDENT_AMBULATORY_CARE_PROVIDER_SITE_OTHER): Payer: Self-pay

## 2018-09-26 NOTE — Telephone Encounter (Signed)
PA required for SynviscOne, right knee. Faxed completed PA form to [employer] at [phone]. 

## 2018-10-01 ENCOUNTER — Telehealth (INDEPENDENT_AMBULATORY_CARE_PROVIDER_SITE_OTHER): Payer: Self-pay

## 2018-10-01 NOTE — Telephone Encounter (Signed)
Called and left a VM advising patient to call back and schedule for gel injection with Dr. Erlinda Hong.  Patient is approved for SynviscOne, right knee. [place] Covered at 100% of the allowed amount Covered at 100% after Co-pay of $105.00 PA required PA Approval# [id number] Valid 09/26/2018- 09/26/2019

## 2018-10-06 ENCOUNTER — Encounter: Admission: RE | Payer: Self-pay | Source: Ambulatory Visit

## 2018-10-06 ENCOUNTER — Ambulatory Visit
Admission: RE | Admit: 2018-10-06 | Payer: BLUE CROSS/BLUE SHIELD | Source: Ambulatory Visit | Admitting: Unknown Physician Specialty

## 2018-10-06 HISTORY — DX: Type 2 diabetes mellitus without complications: E11.9

## 2018-10-06 HISTORY — DX: Essential (primary) hypertension: I10

## 2018-10-06 HISTORY — DX: Anxiety disorder, unspecified: F41.9

## 2018-10-06 SURGERY — COLONOSCOPY WITH PROPOFOL
Anesthesia: General

## 2018-12-12 ENCOUNTER — Other Ambulatory Visit: Payer: Self-pay

## 2018-12-12 ENCOUNTER — Emergency Department: Payer: BLUE CROSS/BLUE SHIELD

## 2018-12-12 ENCOUNTER — Emergency Department
Admission: EM | Admit: 2018-12-12 | Discharge: 2018-12-12 | Disposition: A | Discharge status: Home / Self Care | Payer: BLUE CROSS/BLUE SHIELD | Attending: Emergency Medicine | Admitting: Emergency Medicine

## 2018-12-12 DIAGNOSIS — R0602 Shortness of breath: Secondary | ICD-10-CM | POA: Diagnosis present

## 2018-12-12 DIAGNOSIS — E119 Type 2 diabetes mellitus without complications: Secondary | ICD-10-CM | POA: Insufficient documentation

## 2018-12-12 DIAGNOSIS — J189 Pneumonia, unspecified organism: Secondary | ICD-10-CM | POA: Diagnosis not present

## 2018-12-12 DIAGNOSIS — I10 Essential (primary) hypertension: Secondary | ICD-10-CM | POA: Insufficient documentation

## 2018-12-12 LAB — CBC
HEMATOCRIT: 41.5 % (ref 36.0–46.0)
HEMOGLOBIN: 13.4 g/dL (ref 12.0–15.0)
MCH: 28 pg (ref 26.0–34.0)
MCHC: 32.3 g/dL (ref 30.0–36.0)
MCV: 86.8 fL (ref 80.0–100.0)
Platelets: 235 10*3/uL (ref 150–400)
RBC: 4.78 MIL/uL (ref 3.87–5.11)
RDW: 12.3 % (ref 11.5–15.5)
WBC: 7.8 10*3/uL (ref 4.0–10.5)
nRBC: 0 % (ref 0.0–0.2)

## 2018-12-12 LAB — BASIC METABOLIC PANEL
ANION GAP: 7 (ref 5–15)
BUN: 12 mg/dL (ref 6–20)
CHLORIDE: 102 mmol/L (ref 98–111)
CO2: 28 mmol/L (ref 22–32)
Calcium: 9 mg/dL (ref 8.9–10.3)
Creatinine, Ser: 0.79 mg/dL (ref 0.44–1.00)
GFR calc Af Amer: >60 mL/min (ref >60)
Glucose, Bld: 222 mg/dL — ABNORMAL HIGH (ref 70–99)
POTASSIUM: 4 mmol/L (ref 3.5–5.1)
SODIUM: 137 mmol/L (ref 135–145)

## 2018-12-12 LAB — TROPONIN I

## 2018-12-12 MED ORDER — AZITHROMYCIN 250 MG PO TABS: ORAL_TABLET | ORAL | 0 refills | Status: AC

## 2018-12-12 NOTE — ED Provider Notes (Signed)
[facility] Emergency Department Provider Note  ____________________________________________   I have reviewed the triage vital signs and the nursing notes.   HISTORY  Chief Complaint Shortness of breath  History limited by: Not Limited   HPI Natalie Greene is a 47 y.o. female who presents to the emergency department today with primary concern for shortness of breath.  She states that it all started 2 days ago when she woke up from sleep feeling as she was having a hard time breathing.  She stated that she needed to sit up and move around.  Since then she is continued to have some shortness of breath as well as fatigue.  Patient states that she does occasionally have episodes like this.  She states she has been evaluated for this in the past and has seen a cardiologist and undergone echocardiogram as well as stress test.  The patient states that she came in today because the shortness of breath was worse than normal.  She denies any chest pain.  She denies any fevers although her partner thought that she felt warm.  Per medical record review patient has a history of HTN, lupus.   Past Medical History:  Diagnosis Date  . Anxiety   . Cancer ([place])   . Diabetes mellitus without complication ([place])   . GERD (gastroesophageal reflux disease)   . Hypertension   . Lupus ([place])   . Squamous cell carcinoma     Patient Active Problem List   Diagnosis Date Noted  . Vaginitis and vulvovaginitis 04/15/2016  . Right knee pain 03/09/2016  . GERD (gastroesophageal reflux disease) 03/09/2016  . Influenza with respiratory manifestation 02/14/2016  . H/O cold sores 06/21/2015  . Diabetes mellitus type II, controlled ([place]) 06/13/2015  . Hypokalemia 05/25/2015  . Encounter to establish care 02/07/2015  . Severe obesity (BMI >= 40) ([place]) 02/07/2015  . Generalized anxiety disorder 02/07/2015    Past Surgical History:  Procedure Laterality Date  . ABDOMINAL HYSTERECTOMY      approx 5 years ago from 2017   . CHOLECYSTECTOMY    . WISDOM TOOTH EXTRACTION      Prior to Admission medications   Medication Sig Start Date End Date Taking? Authorizing Provider  ALPRAZolam Duanne Moron) 1 MG tablet TAKE ONE TABLET BY MOUTH AT BEDTIME AS NEEDED FOR ANXIETY 10/10/16   Leone Haven, MD  esomeprazole (NEXIUM) 40 MG capsule Take 1 capsule (40 mg total) by mouth daily at 12 noon. 09/04/16   Leone Haven, MD  hydrochlorothiazide (HYDRODIURIL) 25 MG tablet TAKE ONE TABLET BY MOUTH DAILY. 04/15/17   Leone Haven, MD  phentermine (ADIPEX-P) 37.5 MG tablet Take 1 tablet (37.5 mg total) by mouth daily before breakfast. 07/04/16   Leone Haven, MD  valACYclovir (VALTREX) 500 MG tablet TAKE 1 TABLET (500 MG TOTAL) BY MOUTH 2 (TWO) TIMES DAILY. 10/17/16   Leone Haven, MD    Allergies Patient has no known allergies.  Family History  Problem Relation Age of Onset  . Cancer Father        colon and prostate  . Diabetes Father   . Breast cancer Other     Social History Social History   Tobacco Use  . Smoking status: Never Smoker  . Smokeless tobacco: Never Used  Substance Use Topics  . Alcohol use: Yes    Alcohol/week: 0.0 standard drinks  . Drug use: No    Review of Systems Constitutional: No fever/chills Eyes: No visual changes.  ENT: No sore throat. Cardiovascular: Denies chest pain. Respiratory: Positive for shortness of breath. Gastrointestinal: No abdominal pain.  No nausea, no vomiting.  No diarrhea.   Genitourinary: Negative for dysuria. Musculoskeletal: Negative for back pain. Skin: Negative for rash. Neurological: Negative for headaches, focal weakness or numbness.  ____________________________________________   PHYSICAL EXAM:  VITAL SIGNS: ED Triage Vitals  Enc Vitals Group     BP 12/12/18 1319 (!) 136/94     Pulse Rate 12/12/18 1317 80     Resp 12/12/18 1317 18     Temp 12/12/18 1317 98.5 F (36.9 C)     Temp Source  12/12/18 1317 Oral     SpO2 12/12/18 1317 97 %     Weight 12/12/18 1318 200 lb (90.7 kg)     Height 12/12/18 1318 5\' 1"  (1.549 m)     Head Circumference --      Peak Flow --      Pain Score 12/12/18 1318 0   Constitutional: Alert and oriented.  Eyes: Conjunctivae are normal.  ENT      Head: Normocephalic and atraumatic.      Nose: No congestion/rhinnorhea.      Mouth/Throat: Mucous membranes are moist.      Neck: No stridor. Hematological/Lymphatic/Immunilogical: No cervical lymphadenopathy. Cardiovascular: Normal rate, regular rhythm.  No murmurs, rubs, or gallops.  Respiratory: Normal respiratory effort without tachypnea nor retractions. Breath sounds are clear and equal bilaterally. No wheezes/rales/rhonchi. Gastrointestinal: Soft and non tender. No rebound. No guarding.  Genitourinary: Deferred Musculoskeletal: Normal range of motion in all extremities. No lower extremity edema. Neurologic:  Normal speech and language. No gross focal neurologic deficits are appreciated.  Skin:  Skin is warm, dry and intact. No rash noted. Psychiatric: Mood and affect are normal. Speech and behavior are normal. Patient exhibits appropriate insight and judgment.  ____________________________________________    LABS (pertinent positives/negatives)  Trop <0.03 CBC wbc 7.8, hgb 13.4, plt 235 BMP wnl except glu 222  ____________________________________________   EKG  I, Nance Pear, attending physician, personally viewed and interpreted this EKG  EKG Time: 1324 Rate: 72 Rhythm: normal sinus rhythm Axis: left axis deviation Intervals: qtc 455 QRS: narrow, q waves v1, v2, lvh ST changes: no st elevation Impression: abnormal ekg   ____________________________________________    RADIOLOGY  CXR Lingular opacity  ____________________________________________   PROCEDURES  Procedures  ____________________________________________   INITIAL IMPRESSION / ASSESSMENT AND PLAN  / ED COURSE  Pertinent labs & imaging results that were available during my care of the patient were reviewed by me and considered in my medical decision making (see chart for details).   Patient presented with primary complaint of shortness of breath. Also complaining of fatigue. ddx would be broad including anemia, electrolyte abnromality, pneumonia, ptx, amongst other etiologies. CXR is concerning for pneumonia. Unclear why patient would be having similar and recurrent episodes in the past however would explain the shortness of breath and fatigue the patient currently has. Did discuss this result with the patient. Will plan on giving prescription for antibiotics. Discussed importance of PCP follow up.  ________________________________________   FINAL CLINICAL IMPRESSION(S) / ED DIAGNOSES  Final diagnoses:  Pneumonia due to infectious organism, unspecified laterality, unspecified part of lung     Note: This dictation was prepared with Dragon dictation. Any transcriptional errors that result from this process are unintentional     Nance Pear, MD 12/12/18 1625

## 2018-12-12 NOTE — Discharge Instructions (Addendum)
Please seek medical attention for any high fevers, chest pain, shortness of breath, change in behavior, persistent vomiting, bloody stool or any other new or concerning symptoms.  

## 2018-12-12 NOTE — ED Triage Notes (Signed)
Pt c/o SOB X 3 nights. Pt also c/o bilateral lower leg swelling. States hx of same a few months ago-had a cardiology workup with no findings. Pt states that she has had clear productive cough for past few days. Pt reports exertional sob. Pt able to talk in full and complete sentences. Pt alert and oriented X4, active, cooperative, pt in NAD. RR even and unlabored, color WNL.

## 2018-12-17 ENCOUNTER — Emergency Department (HOSPITAL_COMMUNITY): Payer: BLUE CROSS/BLUE SHIELD

## 2018-12-17 ENCOUNTER — Emergency Department (HOSPITAL_COMMUNITY)
Admission: EM | Admit: 2018-12-17 | Discharge: 2018-12-18 | Disposition: A | Discharge status: Home / Self Care | Payer: BLUE CROSS/BLUE SHIELD | Attending: Emergency Medicine | Admitting: Emergency Medicine

## 2018-12-17 ENCOUNTER — Other Ambulatory Visit: Payer: Self-pay

## 2018-12-17 DIAGNOSIS — E119 Type 2 diabetes mellitus without complications: Secondary | ICD-10-CM | POA: Diagnosis not present

## 2018-12-17 DIAGNOSIS — R531 Weakness: Secondary | ICD-10-CM

## 2018-12-17 DIAGNOSIS — R130 Aphagia: Secondary | ICD-10-CM | POA: Diagnosis present

## 2018-12-17 DIAGNOSIS — R0602 Shortness of breath: Secondary | ICD-10-CM | POA: Insufficient documentation

## 2018-12-17 DIAGNOSIS — I10 Essential (primary) hypertension: Secondary | ICD-10-CM | POA: Insufficient documentation

## 2018-12-17 DIAGNOSIS — Z79899 Other long term (current) drug therapy: Secondary | ICD-10-CM | POA: Diagnosis not present

## 2018-12-17 DIAGNOSIS — I639 Cerebral infarction, unspecified: Secondary | ICD-10-CM

## 2018-12-17 LAB — CBC
HCT: 41.7 % (ref 36.0–46.0)
Hemoglobin: 13.9 g/dL (ref 12.0–15.0)
MCH: 28.3 pg (ref 26.0–34.0)
MCHC: 33.3 g/dL (ref 30.0–36.0)
MCV: 84.8 fL (ref 80.0–100.0)
PLATELETS: 239 10*3/uL (ref 150–400)
RBC: 4.92 MIL/uL (ref 3.87–5.11)
RDW: 12.4 % (ref 11.5–15.5)
WBC: 10 10*3/uL (ref 4.0–10.5)
nRBC: 0 % (ref 0.0–0.2)

## 2018-12-17 LAB — I-STAT CHEM 8, ED
BUN: 14 mg/dL (ref 6–20)
CHLORIDE: 105 mmol/L (ref 98–111)
Calcium, Ion: 1.2 mmol/L (ref 1.15–1.40)
Creatinine, Ser: 0.6 mg/dL (ref 0.44–1.00)
Glucose, Bld: 162 mg/dL — ABNORMAL HIGH (ref 70–99)
HCT: 43 % (ref 36.0–46.0)
Hemoglobin: 14.6 g/dL (ref 12.0–15.0)
Potassium: 3.7 mmol/L (ref 3.5–5.1)
SODIUM: 140 mmol/L (ref 135–145)
TCO2: 25 mmol/L (ref 22–32)

## 2018-12-17 LAB — PROTIME-INR
INR: 0.95
Prothrombin Time: 12.6 seconds (ref 11.4–15.2)

## 2018-12-17 LAB — COMPREHENSIVE METABOLIC PANEL
ALBUMIN: 3.7 g/dL (ref 3.5–5.0)
ALT: 23 U/L (ref 0–44)
AST: 22 U/L (ref 15–41)
Alkaline Phosphatase: 47 U/L (ref 38–126)
Anion gap: 10 (ref 5–15)
BUN: 15 mg/dL (ref 6–20)
CHLORIDE: 106 mmol/L (ref 98–111)
CO2: 24 mmol/L (ref 22–32)
Calcium: 9.3 mg/dL (ref 8.9–10.3)
Creatinine, Ser: 0.71 mg/dL (ref 0.44–1.00)
GFR calc Af Amer: >60 mL/min (ref >60)
GFR calc non Af Amer: >60 mL/min (ref >60)
GLUCOSE: 163 mg/dL — AB (ref 70–99)
POTASSIUM: 3.6 mmol/L (ref 3.5–5.1)
Sodium: 140 mmol/L (ref 135–145)
Total Bilirubin: 0.3 mg/dL (ref 0.3–1.2)
Total Protein: 7.3 g/dL (ref 6.5–8.1)

## 2018-12-17 LAB — DIFFERENTIAL
ABS IMMATURE GRANULOCYTES: 0.02 10*3/uL (ref 0.00–0.07)
BASOS PCT: 1 %
Basophils Absolute: 0.1 10*3/uL (ref 0.0–0.1)
EOS ABS: 0.2 10*3/uL (ref 0.0–0.5)
Eosinophils Relative: 2 %
Immature Granulocytes: 0 %
Lymphocytes Relative: 21 %
Lymphs Abs: 2.1 10*3/uL (ref 0.7–4.0)
Monocytes Absolute: 0.7 10*3/uL (ref 0.1–1.0)
Monocytes Relative: 7 %
NEUTROS PCT: 69 %
Neutro Abs: 7 10*3/uL (ref 1.7–7.7)

## 2018-12-17 LAB — APTT: APTT: 29 s (ref 24–36)

## 2018-12-17 LAB — I-STAT TROPONIN, ED: TROPONIN I, POC: 0.01 ng/mL (ref 0.00–0.08)

## 2018-12-17 LAB — I-STAT BETA HCG BLOOD, ED (MC, WL, AP ONLY): I-stat hCG, quantitative: <5 m[IU]/mL (ref <5)

## 2018-12-17 LAB — CBG MONITORING, ED: Glucose-Capillary: 156 mg/dL — ABNORMAL HIGH (ref 70–99)

## 2018-12-17 MED ORDER — IOPAMIDOL (ISOVUE-370) INJECTION 76%
100.0000 mL | Freq: Once | INTRAVENOUS | Status: AC | PRN
  Administered 2018-12-17: 100 mL via INTRAVENOUS

## 2018-12-17 NOTE — ED Triage Notes (Signed)
Per EMS patient called husband and had slurred speech and pt did not feel right patients husband arrived home and found patient nonverbal. Pt cannot move L side

## 2018-12-17 NOTE — ED Notes (Signed)
Patient transported to MRI 

## 2018-12-17 NOTE — ED Notes (Signed)
EMS VS BP 159/124, HR 72, glucose 161

## 2018-12-17 NOTE — ED Provider Notes (Signed)
[facility] EMERGENCY DEPARTMENT Provider Note   CSN: [id number] Arrival date & time: 12/17/18  1712   An emergency department physician performed an initial assessment on this suspected stroke patient at 1713.  History   Chief Complaint Chief Complaint  Patient presents with  . Code Stroke    HPI Natalie Greene is a 48 y.o. female history of diabetes, reflux, hypertension, lupus here presenting with trouble speaking, left-sided weakness.  Patient was recently diagnosed with pneumonia and is currently on antibiotics.  Patient did not feel right and apparently had slurred speech about an hour prior to arrival.  Her husband came home and found her nonverbal and trouble moving the left side.  Stroke was activated by EMS.  The history is provided by the patient.    Past Medical History:  Diagnosis Date  . Anxiety   . Cancer ([place])   . Diabetes mellitus without complication ([place])   . GERD (gastroesophageal reflux disease)   . Hypertension   . Lupus ([place])   . Squamous cell carcinoma     Patient Active Problem List   Diagnosis Date Noted  . Vaginitis and vulvovaginitis 04/15/2016  . Right knee pain 03/09/2016  . GERD (gastroesophageal reflux disease) 03/09/2016  . Influenza with respiratory manifestation 02/14/2016  . H/O cold sores 06/21/2015  . Diabetes mellitus type II, controlled ([place]) 06/13/2015  . Hypokalemia 05/25/2015  . Encounter to establish care 02/07/2015  . Severe obesity (BMI >= 40) ([place]) 02/07/2015  . Generalized anxiety disorder 02/07/2015    Past Surgical History:  Procedure Laterality Date  . ABDOMINAL HYSTERECTOMY     approx 5 years ago from 2017   . CHOLECYSTECTOMY    . WISDOM TOOTH EXTRACTION       OB History   No obstetric history on file.      Home Medications    Prior to Admission medications   Medication Sig Start Date End Date Taking? Authorizing Provider  ALPRAZolam (XANAX) 1 MG tablet TAKE ONE TABLET BY MOUTH AT  BEDTIME AS NEEDED FOR ANXIETY Patient taking differently: Take 1 mg by mouth at bedtime as needed for sleep.  10/10/16  Yes Leone Haven, MD  Ascorbic Acid (VITAMIN C PO) Take 1 tablet by mouth daily.   Yes [provider]  esomeprazole (NEXIUM) 40 MG capsule Take 1 capsule (40 mg total) by mouth daily at 12 noon. 09/04/16  Yes Leone Haven, MD  furosemide (LASIX) 20 MG tablet Take 20 mg by mouth 2 (two) times daily as needed for fluid.   Yes [provider]  linagliptin (TRADJENTA) 5 MG TABS tablet Take 5 mg by mouth daily.   Yes [provider]  meloxicam (MOBIC) 7.5 MG tablet Take 15 mg by mouth daily.  10/31/18  Yes [provider]  phentermine (ADIPEX-P) 37.5 MG tablet Take 1 tablet (37.5 mg total) by mouth daily before breakfast. 07/04/16  Yes Leone Haven, MD  valACYclovir (VALTREX) 500 MG tablet TAKE 1 TABLET (500 MG TOTAL) BY MOUTH 2 (TWO) TIMES DAILY. Patient taking differently: Take 500 mg by mouth 2 (two) times daily as needed (for fever blisters or as directed).  10/17/16  Yes Leone Haven, MD  VITAMIN E PO Take 1 capsule by mouth daily.   Yes [provider]  azithromycin (ZITHROMAX Z-PAK) 250 MG tablet Take 2 tablets (500 mg) on  Day 1,  followed by 1 tablet (250 mg) once daily on Days 2 through 5. Patient  not taking: Reported on 12/17/2018 12/12/18 12/17/18  Nance Pear, MD  hydrochlorothiazide (HYDRODIURIL) 25 MG tablet TAKE ONE TABLET BY MOUTH DAILY. Patient not taking: Reported on 12/17/2018 04/15/17   Leone Haven, MD    Family History Family History  Problem Relation Age of Onset  . Cancer Father        colon and prostate  . Diabetes Father   . Breast cancer Other     Social History Social History   Tobacco Use  . Smoking status: Never Smoker  . Smokeless tobacco: Never Used  Substance Use Topics  . Alcohol use: Yes    Alcohol/week: 0.0 standard drinks  . Drug use: No     Allergies     Patient has no known allergies.   Review of Systems Review of Systems  Neurological: Positive for weakness.  All other systems reviewed and are negative.    Physical Exam Updated Vital Signs Wt 103.7 kg   BMI 43.20 kg/m   Physical Exam Vitals signs and nursing note reviewed.  Constitutional:      Comments: Tearful, anxious   HENT:     Head: Normocephalic.     Mouth/Throat:     Mouth: Mucous membranes are moist.  Eyes:     Pupils: Pupils are equal, round, and reactive to light.  Neck:     Musculoskeletal: Normal range of motion.  Cardiovascular:     Rate and Rhythm: Normal rate.  Pulmonary:     Effort: Pulmonary effort is normal.  Abdominal:     General: Abdomen is flat.  Musculoskeletal: Normal range of motion.  Skin:    General: Skin is warm.  Neurological:     Mental Status: She is alert.     Comments: CN 2- 12 intact. Subjective weakness 4/5 L arm but avoids dropping L arm on the face. Nl sensation throughout   Psychiatric:     Comments: Anxious       ED Treatments / Results  Labs (all labs ordered are listed, but only abnormal results are displayed) Labs Reviewed  COMPREHENSIVE METABOLIC PANEL - Abnormal; Notable for the following components:      Result Value   Glucose, Bld 163 (*)    All other components within normal limits  CBG MONITORING, ED - Abnormal; Notable for the following components:   Glucose-Capillary 156 (*)    All other components within normal limits  I-STAT CHEM 8, ED - Abnormal; Notable for the following components:   Glucose, Bld 162 (*)    All other components within normal limits  PROTIME-INR  APTT  CBC  DIFFERENTIAL  I-STAT TROPONIN, ED  I-STAT BETA HCG BLOOD, ED ([facility], [facility], [facility] ONLY)    EKG None  Radiology Ct Angio Head W Or Wo Contrast  Result Date: 12/17/2018 CLINICAL DATA:  Not talking.  Left-sided weakness. EXAM: CT ANGIOGRAPHY HEAD AND NECK CT PERFUSION BRAIN TECHNIQUE: Multidetector CT imaging of the head and  neck was performed using the standard protocol during bolus administration of intravenous contrast. Multiplanar CT image reconstructions and MIPs were obtained to evaluate the vascular anatomy. Carotid stenosis measurements (when applicable) are obtained utilizing NASCET criteria, using the distal internal carotid diameter as the denominator. Multiphase CT imaging of the brain was performed following IV bolus contrast injection. Subsequent parametric perfusion maps were calculated using RAPID software. CONTRAST:  Dose not currently available COMPARISON:  Head CT from earlier today FINDINGS: CTA NECK FINDINGS Aortic arch: Unremarkable Right carotid system: Vessels are smooth and  widely patent. ICA loop. No atherosclerosis. Left carotid system: Vessels are smooth and widely patent. ICA looping. No atheromatous changes. Vertebral arteries: No proximal subclavian stenosis or atherosclerosis. Left dominant vertebral artery. Vertebral tortuosity without stenosis or beading. Skeleton: No acute or aggressive finding. Other neck: Negative Upper chest: Negative Review of the MIP images confirms the above findings CTA HEAD FINDINGS Anterior circulation: Vessels are smooth and widely patent. Negative for aneurysm Posterior circulation: Vessels are smooth and widely patent. Negative for aneurysm Venous sinuses: Patent as permitted by contrast timing. Anatomic variants: None significant Delayed phase: Not obtained in the emergent setting Review of the MIP images confirms the above findings CT Brain Perfusion Findings: CBF (<30%) Volume: 77mL Perfusion (Tmax>6.0s) volume: 85mL IMPRESSION: Negative CTA and CTP. Electronically Signed   By: Monte Fantasia M.D.   On: 12/17/2018 17:47   Ct Angio Neck W Or Wo Contrast  Result Date: 12/17/2018 CLINICAL DATA:  Not talking.  Left-sided weakness. EXAM: CT ANGIOGRAPHY HEAD AND NECK CT PERFUSION BRAIN TECHNIQUE: Multidetector CT imaging of the head and neck was performed using the standard  protocol during bolus administration of intravenous contrast. Multiplanar CT image reconstructions and MIPs were obtained to evaluate the vascular anatomy. Carotid stenosis measurements (when applicable) are obtained utilizing NASCET criteria, using the distal internal carotid diameter as the denominator. Multiphase CT imaging of the brain was performed following IV bolus contrast injection. Subsequent parametric perfusion maps were calculated using RAPID software. CONTRAST:  Dose not currently available COMPARISON:  Head CT from earlier today FINDINGS: CTA NECK FINDINGS Aortic arch: Unremarkable Right carotid system: Vessels are smooth and widely patent. ICA loop. No atherosclerosis. Left carotid system: Vessels are smooth and widely patent. ICA looping. No atheromatous changes. Vertebral arteries: No proximal subclavian stenosis or atherosclerosis. Left dominant vertebral artery. Vertebral tortuosity without stenosis or beading. Skeleton: No acute or aggressive finding. Other neck: Negative Upper chest: Negative Review of the MIP images confirms the above findings CTA HEAD FINDINGS Anterior circulation: Vessels are smooth and widely patent. Negative for aneurysm Posterior circulation: Vessels are smooth and widely patent. Negative for aneurysm Venous sinuses: Patent as permitted by contrast timing. Anatomic variants: None significant Delayed phase: Not obtained in the emergent setting Review of the MIP images confirms the above findings CT Brain Perfusion Findings: CBF (<30%) Volume: 35mL Perfusion (Tmax>6.0s) volume: 12mL IMPRESSION: Negative CTA and CTP. Electronically Signed   By: Monte Fantasia M.D.   On: 12/17/2018 17:47   Ct Cerebral Perfusion W Contrast  Result Date: 12/17/2018 CLINICAL DATA:  Not talking.  Left-sided weakness. EXAM: CT ANGIOGRAPHY HEAD AND NECK CT PERFUSION BRAIN TECHNIQUE: Multidetector CT imaging of the head and neck was performed using the standard protocol during bolus administration  of intravenous contrast. Multiplanar CT image reconstructions and MIPs were obtained to evaluate the vascular anatomy. Carotid stenosis measurements (when applicable) are obtained utilizing NASCET criteria, using the distal internal carotid diameter as the denominator. Multiphase CT imaging of the brain was performed following IV bolus contrast injection. Subsequent parametric perfusion maps were calculated using RAPID software. CONTRAST:  Dose not currently available COMPARISON:  Head CT from earlier today FINDINGS: CTA NECK FINDINGS Aortic arch: Unremarkable Right carotid system: Vessels are smooth and widely patent. ICA loop. No atherosclerosis. Left carotid system: Vessels are smooth and widely patent. ICA looping. No atheromatous changes. Vertebral arteries: No proximal subclavian stenosis or atherosclerosis. Left dominant vertebral artery. Vertebral tortuosity without stenosis or beading. Skeleton: No acute or aggressive finding. Other neck: Negative Upper  chest: Negative Review of the MIP images confirms the above findings CTA HEAD FINDINGS Anterior circulation: Vessels are smooth and widely patent. Negative for aneurysm Posterior circulation: Vessels are smooth and widely patent. Negative for aneurysm Venous sinuses: Patent as permitted by contrast timing. Anatomic variants: None significant Delayed phase: Not obtained in the emergent setting Review of the MIP images confirms the above findings CT Brain Perfusion Findings: CBF (<30%) Volume: 53mL Perfusion (Tmax>6.0s) volume: 30mL IMPRESSION: Negative CTA and CTP. Electronically Signed   By: Monte Fantasia M.D.   On: 12/17/2018 17:47   Dg Chest Port 1 View  Result Date: 12/17/2018 CLINICAL DATA:  Shortness of breath EXAM: PORTABLE CHEST 1 VIEW COMPARISON:  December 12, 2018 FINDINGS: Mediastinal contour is normal. The heart size is upper limits of normal. There is no focal infiltrate, pulmonary edema, or pleural effusion. The visualized skeletal structures  are unremarkable. IMPRESSION: No active cardiopulmonary disease. Electronically Signed   By: Abelardo Diesel M.D.   On: 12/17/2018 18:23   Ct Head Code Stroke Wo Contrast  Result Date: 12/17/2018 CLINICAL DATA:  Code stroke.  Stroke suspected.  Left-sided weakness EXAM: CT HEAD WITHOUT CONTRAST TECHNIQUE: Contiguous axial images were obtained from the base of the skull through the vertex without intravenous contrast. COMPARISON:  None. FINDINGS: Brain: No evidence of acute infarction, hemorrhage, hydrocephalus, extra-axial collection or mass lesion/mass effect. Vascular: No hyperdense vessel or unexpected calcification. Skull: Normal. Negative for fracture or focal lesion. Sinuses/Orbits: No acute finding. Other: These results were communicated to Dr. Rory Percy at 5:33 pmon 1/1/2020by text page via the [facility] messaging system. ASPECTS [facility] Stroke Program Early CT Score) - Ganglionic level infarction (caudate, lentiform nuclei, internal capsule, insula, M1-M3 cortex): 7 - Supraganglionic infarction (M4-M6 cortex): 3 Total score (0-10 with 10 being normal): 10 IMPRESSION: Negative head CT.  ASPECTS is 10. Electronically Signed   By: Monte Fantasia M.D.   On: 12/17/2018 17:34    Procedures Procedures (including critical care time)  Medications Ordered in ED Medications  iopamidol (ISOVUE-370) 76 % injection 100 mL (100 mLs Intravenous Contrast Given 12/17/18 1747)     Initial Impression / Assessment and Plan / ED Course  I have reviewed the triage vital signs and the nursing notes.  Pertinent labs & imaging results that were available during my care of the patient were reviewed by me and considered in my medical decision making (see chart for details).    Natalie Greene is a 48 y.o. female here with weakness, trouble speaking. Code stroke activated by EMS. Dr. Malen Gauze at bedside. Will get CT head/CTA head/neck. Will follow up neuro consult.   5 pm CT head and CTA unremarkable. He recommend MRI brain  and if negative can be discharged.   11:41 PM MRI brain unremarkable. Per Dr. Johny Chess note, patient doesn't require any further workup currently. Family is anxious about her weakness. She is still on abx for pneumonia and her WBC count is normal today and CXR showed no obvious pneumonia. I think she is stable for discharge. She can follow up with neuro outpatient.    Final Clinical Impressions(s) / ED Diagnoses   Final diagnoses:  None    ED Discharge Orders    None       Drenda Freeze, MD 12/17/18 2342

## 2018-12-17 NOTE — ED Notes (Signed)
Pt given ginger ale to drink. 

## 2018-12-17 NOTE — Discharge Instructions (Signed)
Continue your current medicines, including your antibiotic.   See your doctor.   Take ASA 81 mg daily.   Follow up with neurology outpatient if you have persistent symptoms   Return to ER if you have worse weakness, trouble speaking, numbness

## 2018-12-17 NOTE — Consult Note (Signed)
Neurology Consultation  Reason for Consult: Code stroke Referring Physician: Darl Householder  CC: Inability to speak with left-sided weakness  History is obtained from: EMS  HPI: Natalie Greene is a 48 y.o. female past medical history as, cell carcinoma, lupus, hypertension, diabetes, anxiety, cancer.  Patient was at her house at approximately 1 hour ago when she suddenly had left-sided weakness and was noted to not be speaking.  EMS was called.  During the transport no changes were noted.  She did have hypertension with initial blood pressures in the 073X systolically and diastolically in the 106Y.  Upon entering the hospital her systolic blood pressure was in the 150s.  Patient was able to nod yes no when asked questions.  Initially it was noted that she did not allow her left hand hit her forehead in addition when trying to lift for her left leg she did not push down with her right leg.  ED course : Brought to CT immediately   LKW: 1600 hrs 12/17/2018 tpa given?: no,  Premorbid modified Rankin scale (mRS): 0 NIH stroke scale of 9  ROS:  Unable to obtain due to patient not talking.   Past Medical History:  Diagnosis Date  . Anxiety   . Cancer ([place])   . Diabetes mellitus without complication ([place])   . GERD (gastroesophageal reflux disease)   . Hypertension   . Lupus ([place])   . Squamous cell carcinoma     Family History  Problem Relation Age of Onset  . Cancer Father        colon and prostate  . Diabetes Father   . Breast cancer Other    Social History:   reports that she has never smoked. She has never used smokeless tobacco. She reports current alcohol use. She reports that she does not use drugs.  Medications No current facility-administered medications for this encounter.   Current Outpatient Medications:  .  ALPRAZolam (XANAX) 1 MG tablet, TAKE ONE TABLET BY MOUTH AT BEDTIME AS NEEDED FOR ANXIETY, Disp: 30 tablet, Rfl: 2 .  azithromycin (ZITHROMAX Z-PAK) 250 MG tablet, Take 2  tablets (500 mg) on  Day 1,  followed by 1 tablet (250 mg) once daily on Days 2 through 5., Disp: 6 each, Rfl: 0 .  esomeprazole (NEXIUM) 40 MG capsule, Take 1 capsule (40 mg total) by mouth daily at 12 noon., Disp: 90 capsule, Rfl: 1 .  hydrochlorothiazide (HYDRODIURIL) 25 MG tablet, TAKE ONE TABLET BY MOUTH DAILY., Disp: 90 tablet, Rfl: 1 .  phentermine (ADIPEX-P) 37.5 MG tablet, Take 1 tablet (37.5 mg total) by mouth daily before breakfast., Disp: 30 tablet, Rfl: 2 .  valACYclovir (VALTREX) 500 MG tablet, TAKE 1 TABLET (500 MG TOTAL) BY MOUTH 2 (TWO) TIMES DAILY., Disp: 30 tablet, Rfl: 2   Exam: Current vital signs: There were no vitals taken for this visit. Vital signs in last 24 hours:    Physical Exam  Constitutional: Appears well-developed and well-nourished.  Psych: Affect not appropriate to situation Eyes: No scleral injection HENT: No OP obstrucion Head: Normocephalic.  Cardiovascular: Normal rate and regular rhythm.  Respiratory: Effort normal, non-labored breathing GI: Soft.  No distension. There is no tenderness.  Skin: WDI  Neuro: Mental Status: Patient is awake at this time she is nonverbal however when asked to perform simple commands that she was able to lift her right arm and her right leg, make a effort to smile, stuck her tongue out minimally.  Cranial Nerves: II: Stiff threat bilaterally  III,IV, VI: EOMI without ptosis or diploplia. pupils are equal, round, and reactive to light.  V: Facial sensation is symmetric to temperature VII: Facial movement is symmetric.  VIII: hearing is intact to voice XII: tongue is midline without atrophy or fasciculations.  Motor: Bring right arm antigravity and right leg antigravity.  Patient allows her left arm to drop to the bed however she does not allow the hand to drop on her forehead and avoids her forehead.  As far as her left leg she states that she cannot lift it but she also shows a positive Hoover sign Sensory: Spots  to noxious stimuli in all extremities Deep Tendon Reflexes: 2+ and symmetric in the biceps and patellae. Plantars: Toes are downgoing bilaterally.  Cerebellar: FNF normal on the right and heel-to-shin normal on the right  Labs I have reviewed labs in epic and the results pertinent to this consultation are:   CBC    Component Value Date/Time   WBC 7.8 12/12/2018 1320   RBC 4.78 12/12/2018 1320   HGB 13.4 12/12/2018 1320   HCT 41.5 12/12/2018 1320   PLT 235 12/12/2018 1320   MCV 86.8 12/12/2018 1320   MCH 28.0 12/12/2018 1320   MCHC 32.3 12/12/2018 1320   RDW 12.3 12/12/2018 1320   LYMPHSABS 2.0 08/23/2015 1443   MONOABS 0.7 08/23/2015 1443   EOSABS 0.1 08/23/2015 1443   BASOSABS 0.0 08/23/2015 1443    CMP     Component Value Date/Time   NA 137 12/12/2018 1320   K 4.0 12/12/2018 1320   CL 102 12/12/2018 1320   CO2 28 12/12/2018 1320   GLUCOSE 222 (H) 12/12/2018 1320   BUN 12 12/12/2018 1320   CREATININE 0.79 12/12/2018 1320   CALCIUM 9.0 12/12/2018 1320   PROT 7.1 05/11/2015 1145   ALBUMIN 4.1 05/11/2015 1145   AST 20 05/11/2015 1145   ALT 38 (H) 05/11/2015 1145   ALKPHOS 59 05/11/2015 1145   BILITOT 0.4 05/11/2015 1145   GFRNONAA >60 12/12/2018 1320   GFRAA >60 12/12/2018 1320    Lipid Panel     Component Value Date/Time   CHOL 178 03/07/2015 1104   TRIG 176.0 (H) 03/07/2015 1104   HDL 51.60 03/07/2015 1104   CHOLHDL 3 03/07/2015 1104   VLDL 35.2 03/07/2015 1104   LDLCALC 91 03/07/2015 1104     Imaging I have reviewed the images obtained: CT-scan of the brain no acute changes.  CTA head and neck also unremarkable.  Etta Quill PA-C [facility] [phone]  M-F  (9:00 am- 5:00 PM)  12/17/2018, 5:22 PM   Attending addendum Patient seen and examined as an acute code stroke for left-sided weakness and inability to talk. Her symptoms started about an hour prior to arrival. On examination, she had giveaway weakness on the left side  including positive Hoover sign, was able to follow commands and was mumbling and whispering words.  Then she started stuttering her words started to speak some.  Very inconsistent exam.  Details as above.   Assessment: 48 year old female presented to the hospital with sudden onset of left-sided weakness along with inability to talk.  Exam has multiple inconsistencies.  CT did not show any acute abnormalities.  CTA head and neck unremarkable.  CT perfusion study with no perfusion deficits. Very less likely stroke but given that she has morbid obesity and hypertension which are risk factors for stroke, I would recommend obtaining an MRI.  If the MRI is negative for stroke, no further  work-up.  Impression: Evaluate for stroke-less likely Possible conversion disorder   Recommendations MRI brain without contrast.  If negative no further work-up needed. If MRI shows any concerning findings, please call neurology.  Relayed plan to Dr. Darl Householder in person.  CRITICAL CARE ATTESTATION Performed by: Amie Portland, MD Total critical care time: 50 minutes Critical care time was exclusive of separately billable procedures and treating other patients and/or supervising APPs/Residents/Students Critical care was necessary to treat or prevent imminent or life-threatening deterioration due to strokelike symptoms. This patient is critically ill and at significant risk for neurological worsening and/or death and care requires constant monitoring. Critical care was time spent personally by me on the following activities: development of treatment plan with patient and/or surrogate as well as nursing, discussions with consultants, evaluation of patient's response to treatment, examination of patient, obtaining history from patient or surrogate, ordering and performing treatments and interventions, ordering and review of laboratory studies, ordering and review of radiographic studies, pulse oximetry, re-evaluation of  patient's condition, participation in multidisciplinary rounds and medical decision making of high complexity in the care of this patient.

## 2018-12-17 NOTE — ED Notes (Signed)
Pt stable and ambulatory for discharge, states understanding follow up.  

## 2018-12-19 ENCOUNTER — Encounter: Payer: Self-pay | Admitting: Neurology

## 2018-12-19 ENCOUNTER — Ambulatory Visit: Payer: BLUE CROSS/BLUE SHIELD | Admitting: Neurology

## 2018-12-19 VITALS — BP 150/96 | HR 68 | Ht 61.0 in | Wt 221.0 lb

## 2018-12-19 DIAGNOSIS — F449 Dissociative and conversion disorder, unspecified: Secondary | ICD-10-CM

## 2018-12-19 MED ORDER — BUPROPION HCL ER (XL) 150 MG PO TB24: 150.0000 mg | ORAL_TABLET | Freq: Every day | ORAL | 2 refills | Status: DC

## 2018-12-19 NOTE — Progress Notes (Signed)
Reason for visit: Left-sided weakness  Referring physician: [facility]  Natalie Greene is a 48 y.o. female  History of present illness:  Natalie Greene is a 48 year old left-handed white female with a history of diabetes and obesity.  The patient presented to the [facility] emergency room on 17 December 2018.  The patient had developed onset over 30 minutes of left-sided weakness, slurred speech and then mutism.  The patient had numbness on the left side as well, she claims that her eyes were forced to the right.  The patient was unable to walk.  She went to the emergency room and was found to have a blood pressure of 948 systolic.  The patient had no headache whatsoever the days she was evaluated in the emergency room.  The patient underwent CT scan of the brain, MRI of the brain, CT angiogram of the head and neck that were all within normal limits.  The patient was discharged to home.  The patient apparently has been under a lot of stress, she is working 2 jobs, she is doing housework and cooking, taking care of children.  The patient has had crying episodes that seem to come on for no reason.  Since being in the emergency room, she continues to have some left-sided symptoms, weakness in the right hand as well, she is able to walk, her speech has returned.  She still has left-sided numbness.  The patient is sent to this office for further evaluation.  Past Medical History:  Diagnosis Date  . Anxiety   . Cancer ([place])   . Diabetes mellitus without complication ([place])   . GERD (gastroesophageal reflux disease)   . Hypertension   . Lupus ([place])   . Squamous cell carcinoma     Past Surgical History:  Procedure Laterality Date  . ABDOMINAL HYSTERECTOMY     approx 5 years ago from 2017   . CHOLECYSTECTOMY    . WISDOM TOOTH EXTRACTION      Family History  Problem Relation Age of Onset  . Cancer Father        colon and prostate  . Diabetes Father   . Breast cancer Other     Social  history:  reports that she has never smoked. She has never used smokeless tobacco. She reports current alcohol use. She reports that she does not use drugs.  Medications:  Prior to Admission medications   Medication Sig Start Date End Date Taking? Authorizing Provider  ALPRAZolam (XANAX) 1 MG tablet TAKE ONE TABLET BY MOUTH AT BEDTIME AS NEEDED FOR ANXIETY Patient taking differently: Take 1 mg by mouth at bedtime as needed for sleep.  10/10/16  Yes Leone Haven, MD  Ascorbic Acid (VITAMIN C PO) Take 1 tablet by mouth daily.   Yes [provider]  esomeprazole (NEXIUM) 40 MG capsule Take 1 capsule (40 mg total) by mouth daily at 12 noon. 09/04/16  Yes Leone Haven, MD  furosemide (LASIX) 20 MG tablet Take 20 mg by mouth 2 (two) times daily as needed for fluid.   Yes [provider]  hydrochlorothiazide (HYDRODIURIL) 25 MG tablet TAKE ONE TABLET BY MOUTH DAILY. 04/15/17  Yes Leone Haven, MD  linagliptin (TRADJENTA) 5 MG TABS tablet Take 5 mg by mouth daily.   Yes [provider]  meloxicam (MOBIC) 7.5 MG tablet Take 15 mg by mouth daily.  10/31/18  Yes [provider]  phentermine (ADIPEX-P) 37.5 MG tablet Take 1 tablet (37.5 mg total)  by mouth daily before breakfast. 07/04/16  Yes Leone Haven, MD  valACYclovir (VALTREX) 500 MG tablet TAKE 1 TABLET (500 MG TOTAL) BY MOUTH 2 (TWO) TIMES DAILY. Patient taking differently: Take 500 mg by mouth 2 (two) times daily as needed (for fever blisters or as directed).  10/17/16  Yes Leone Haven, MD  VITAMIN E PO Take 1 capsule by mouth daily.   Yes [provider]     No Known Allergies  ROS:  Out of a complete 14 system review of symptoms, the patient complains only of the following symptoms, and all other reviewed systems are negative.  Fatigue Swelling in the legs Skin rash Blurred vision, double vision Shortness of breath, cough, snoring Constipation Feeling hot Joint  pain, joint swelling, aching muscles Memory loss, confusion, headache, numbness, weakness, slurred speech, dizziness Depression, anxiety, none of sleep, decreased energy, change in appetite, disinterest in activities, racing thoughts Insomnia  Blood pressure (!) 150/96, pulse 68, height 5\' 1"  (1.549 m), weight 221 lb (100.2 kg), SpO2 97 %.  Physical Exam  General: The patient is alert and cooperative at the time of the examination.  The patient is markedly obese.  Eyes: Pupils are equal, round, and reactive to light. Discs are flat bilaterally.  Neck: The neck is supple, no carotid bruits are noted.  Respiratory: The respiratory examination is clear.  Cardiovascular: The cardiovascular examination reveals a regular rate and rhythm, no obvious murmurs or rubs are noted.  Skin: Extremities are without significant edema.  Neurologic Exam  Mental status: The patient is alert and oriented x 3 at the time of the examination. The patient has apparent normal recent and remote memory, with an apparently normal attention span and concentration ability.  Cranial nerves: Facial symmetry is present. There is good sensation of the face to pinprick and soft touch bilaterally. The strength of the facial muscles and the muscles to head turning and shoulder shrug are normal bilaterally. Speech is well enunciated, no aphasia or dysarthria is noted. Extraocular movements are full. Visual fields are full. The tongue is midline, and the patient has symmetric elevation of the soft palate. No obvious hearing deficits are noted.  Motor: The motor testing reveals 5 over 5 strength of all 4 extremities, but the patient has giveaway type weakness with grip on both hands, left greater than right and with the use of the left arm.Kermit Balo symmetric motor tone is noted throughout.  Sensory: Sensory testing is intact to pinprick, soft touch, vibration sensation, and position sense on all 4 extremities, with exception of  decreased pinprick sensation on the left hand as compared to the right. No evidence of extinction is noted.  Coordination: Cerebellar testing reveals good finger-nose-finger and heel-to-shin bilaterally.  Gait and station: Gait is normal. Tandem gait is normal. Romberg is negative. No drift is seen.  Reflexes: Deep tendon reflexes are symmetric and normal bilaterally. Toes are downgoing bilaterally.   Assessment/Plan:  1.  Conversion reaction  2.  Depression  The patient appears to have significant issues with underlying depression, she has developed manifestation of weakness of the left side and some numbness.  Thorough neurologic evaluation done at [facility] was unremarkable, the patient has not had a stroke event.  The patient was placed on Wellbutrin 150 mg daily, we may increase the dose over time.  The patient will have a referral to [facility].  She will follow-up in 4 months.  Jill Alexanders MD 12/19/2018 10:24 AM  [facility] [address] [place], [place] [address]  Phone [phone] Fax [phone]

## 2018-12-22 ENCOUNTER — Telehealth: Payer: Self-pay | Admitting: Neurology

## 2018-12-22 NOTE — Telephone Encounter (Signed)
Called and spoke patient referral is in there work [employer] .

## 2018-12-22 NOTE — Telephone Encounter (Signed)
Pt has called to inform that [id number] she is being told that they need her demographics and referral information.  Pt is asking for assistance with this, please call

## 2019-01-23 ENCOUNTER — Other Ambulatory Visit: Payer: Self-pay | Admitting: Family Medicine

## 2019-01-23 DIAGNOSIS — Z1231 Encounter for screening mammogram for malignant neoplasm of breast: Secondary | ICD-10-CM

## 2019-02-04 ENCOUNTER — Ambulatory Visit: Payer: Self-pay | Admitting: Psychiatry

## 2019-02-12 ENCOUNTER — Ambulatory Visit
Admission: RE | Admit: 2019-02-12 | Discharge: 2019-02-12 | Disposition: A | Discharge status: Home / Self Care | Payer: BLUE CROSS/BLUE SHIELD | Source: Ambulatory Visit | Attending: Family Medicine | Admitting: Family Medicine

## 2019-02-12 DIAGNOSIS — Z1231 Encounter for screening mammogram for malignant neoplasm of breast: Secondary | ICD-10-CM | POA: Insufficient documentation

## 2019-02-17 ENCOUNTER — Ambulatory Visit: Payer: Self-pay | Admitting: Psychiatry

## 2019-03-20 ENCOUNTER — Emergency Department
Admission: EM | Admit: 2019-03-20 | Discharge: 2019-03-20 | Disposition: A | Discharge status: Home / Self Care | Payer: BLUE CROSS/BLUE SHIELD | Attending: Emergency Medicine | Admitting: Emergency Medicine

## 2019-03-20 ENCOUNTER — Emergency Department: Payer: BLUE CROSS/BLUE SHIELD

## 2019-03-20 ENCOUNTER — Other Ambulatory Visit: Payer: Self-pay

## 2019-03-20 DIAGNOSIS — G459 Transient cerebral ischemic attack, unspecified: Secondary | ICD-10-CM | POA: Insufficient documentation

## 2019-03-20 DIAGNOSIS — Z859 Personal history of malignant neoplasm, unspecified: Secondary | ICD-10-CM | POA: Diagnosis not present

## 2019-03-20 DIAGNOSIS — I10 Essential (primary) hypertension: Secondary | ICD-10-CM | POA: Insufficient documentation

## 2019-03-20 DIAGNOSIS — Z79899 Other long term (current) drug therapy: Secondary | ICD-10-CM | POA: Diagnosis not present

## 2019-03-20 DIAGNOSIS — R531 Weakness: Secondary | ICD-10-CM | POA: Diagnosis present

## 2019-03-20 DIAGNOSIS — E119 Type 2 diabetes mellitus without complications: Secondary | ICD-10-CM | POA: Insufficient documentation

## 2019-03-20 LAB — COMPREHENSIVE METABOLIC PANEL
ALT: 20 U/L (ref 0–44)
AST: 18 U/L (ref 15–41)
Albumin: 4.2 g/dL (ref 3.5–5.0)
Alkaline Phosphatase: 49 U/L (ref 38–126)
Anion gap: 11 (ref 5–15)
BUN: 15 mg/dL (ref 6–20)
CO2: 27 mmol/L (ref 22–32)
Calcium: 9.2 mg/dL (ref 8.9–10.3)
Chloride: 101 mmol/L (ref 98–111)
Creatinine, Ser: 0.91 mg/dL (ref 0.44–1.00)
GFR calc Af Amer: >60 mL/min (ref >60)
GFR calc non Af Amer: >60 mL/min (ref >60)
Glucose, Bld: 179 mg/dL — ABNORMAL HIGH (ref 70–99)
Potassium: 3.6 mmol/L (ref 3.5–5.1)
Sodium: 139 mmol/L (ref 135–145)
Total Bilirubin: 0.5 mg/dL (ref 0.3–1.2)
Total Protein: 8 g/dL (ref 6.5–8.1)

## 2019-03-20 LAB — DIFFERENTIAL
Abs Immature Granulocytes: 0.03 10*3/uL (ref 0.00–0.07)
Basophils Absolute: 0.1 10*3/uL (ref 0.0–0.1)
Basophils Relative: 1 %
Eosinophils Absolute: 0.3 10*3/uL (ref 0.0–0.5)
Eosinophils Relative: 3 %
Immature Granulocytes: 0 %
Lymphocytes Relative: 19 %
Lymphs Abs: 1.9 10*3/uL (ref 0.7–4.0)
Monocytes Absolute: 0.6 10*3/uL (ref 0.1–1.0)
Monocytes Relative: 6 %
Neutro Abs: 7.2 10*3/uL (ref 1.7–7.7)
Neutrophils Relative %: 71 %

## 2019-03-20 LAB — GLUCOSE, CAPILLARY: Glucose-Capillary: 174 mg/dL — ABNORMAL HIGH (ref 70–99)

## 2019-03-20 LAB — CBC
HCT: 38.7 % (ref 36.0–46.0)
Hemoglobin: 12.7 g/dL (ref 12.0–15.0)
MCH: 28.3 pg (ref 26.0–34.0)
MCHC: 32.8 g/dL (ref 30.0–36.0)
MCV: 86.4 fL (ref 80.0–100.0)
Platelets: 236 10*3/uL (ref 150–400)
RBC: 4.48 MIL/uL (ref 3.87–5.11)
RDW: 13.2 % (ref 11.5–15.5)
WBC: 10.1 10*3/uL (ref 4.0–10.5)
nRBC: 0 % (ref 0.0–0.2)

## 2019-03-20 LAB — LIPID PANEL
Cholesterol: 178 mg/dL (ref 0–200)
HDL: 55 mg/dL (ref >40)
LDL Cholesterol: 81 mg/dL (ref 0–99)
Total CHOL/HDL Ratio: 3.2 RATIO
Triglycerides: 210 mg/dL — ABNORMAL HIGH (ref <150)
VLDL: 42 mg/dL — ABNORMAL HIGH (ref 0–40)

## 2019-03-20 LAB — PROTIME-INR
INR: 1 (ref 0.8–1.2)
Prothrombin Time: 13.1 seconds (ref 11.4–15.2)

## 2019-03-20 LAB — APTT: aPTT: 31 seconds (ref 24–36)

## 2019-03-20 LAB — HEMOGLOBIN A1C
Hgb A1c MFr Bld: 6.9 % — ABNORMAL HIGH (ref 4.8–5.6)
Mean Plasma Glucose: 151.33 mg/dL

## 2019-03-20 MED ORDER — ATORVASTATIN CALCIUM 20 MG PO TABS: 20.0000 mg | ORAL_TABLET | Freq: Every day | ORAL | 2 refills | Status: AC

## 2019-03-20 MED ORDER — LISINOPRIL 5 MG PO TABS: 5.0000 mg | ORAL_TABLET | Freq: Every day | ORAL | 2 refills | Status: AC

## 2019-03-20 MED ORDER — LISINOPRIL 5 MG PO TABS: 5.0000 mg | ORAL_TABLET | Freq: Every day | ORAL | 2 refills | Status: DC

## 2019-03-20 MED ORDER — ATORVASTATIN CALCIUM 20 MG PO TABS: 20.0000 mg | ORAL_TABLET | Freq: Every day | ORAL | 2 refills | Status: DC

## 2019-03-20 MED ORDER — SODIUM CHLORIDE 0.9% FLUSH
3.0000 mL | Freq: Once | INTRAVENOUS | Status: DC
Start: 2019-03-20 — End: 2019-03-20

## 2019-03-20 NOTE — ED Provider Notes (Signed)
[facility] Emergency Department Provider Note  ____________________________________________  Time seen: Approximately 4:35 PM  I have reviewed the triage vital signs and the nursing notes.   HISTORY  Chief Complaint Weakness    HPI Natalie Greene is a 48 y.o. female with a history of anxiety diabetes hypertension and lupus who comes to the ED complaining of a sudden onset of left-sided paresthesia and numbness that started about 2:00 PM today.  It resolved prior to arrival in the ED, but lasted about 30 minutes.  Similar to an episode that she had on January 1 of 2020 during which she was evaluated at the [facility] emergency department with CT, CT angiogram, and MRI.  Overall work-up was negative and she was discharged home.  She is followed up with neurology in the interim and recently had a 48-hour Holter monitor completed, for which she has not yet returned the device for processing.  She was in her usual state of health today when she started to feel like she was having palpitations followed by the onset of the left-sided symptoms.  She denies loss of speech or language ability or weakness or falling.  No vision change or loss of consciousness.  No trauma.  She reports that her primary care doctor suspects this may be anxiety related.   She denies any aggravating or alleviating factors or radiating symptoms.     Past Medical History:  Diagnosis Date  . Anxiety   . Cancer ([place])   . Diabetes mellitus without complication ([place])   . GERD (gastroesophageal reflux disease)   . Hypertension   . Lupus ([place])   . Squamous cell carcinoma      Patient Active Problem List   Diagnosis Date Noted  . Vaginitis and vulvovaginitis 04/15/2016  . Right knee pain 03/09/2016  . GERD (gastroesophageal reflux disease) 03/09/2016  . Influenza with respiratory manifestation 02/14/2016  . H/O cold sores 06/21/2015  . Diabetes mellitus type II, controlled ([place])  06/13/2015  . Hypokalemia 05/25/2015  . Encounter to establish care 02/07/2015  . Severe obesity (BMI >= 40) ([place]) 02/07/2015  . Generalized anxiety disorder 02/07/2015     Past Surgical History:  Procedure Laterality Date  . ABDOMINAL HYSTERECTOMY     approx 5 years ago from 2017   . CHOLECYSTECTOMY    . WISDOM TOOTH EXTRACTION       Prior to Admission medications   Medication Sig Start Date End Date Taking? Authorizing Provider  ALPRAZolam (XANAX) 1 MG tablet TAKE ONE TABLET BY MOUTH AT BEDTIME AS NEEDED FOR ANXIETY Patient taking differently: Take 1 mg by mouth at bedtime as needed for sleep.  10/10/16   Leone Haven, MD  Ascorbic Acid (VITAMIN C PO) Take 1 tablet by mouth daily.    [provider]  atorvastatin (LIPITOR) 20 MG tablet Take 1 tablet (20 mg total) by mouth daily. 03/20/19 03/19/20  Carrie Mew, MD  buPROPion (WELLBUTRIN XL) 150 MG 24 hr tablet Take 1 tablet (150 mg total) by mouth daily. 12/19/18   Kathrynn Ducking, MD  esomeprazole (NEXIUM) 40 MG capsule Take 1 capsule (40 mg total) by mouth daily at 12 noon. 09/04/16   Leone Haven, MD  furosemide (LASIX) 20 MG tablet Take 20 mg by mouth 2 (two) times daily as needed for fluid.    [provider]  hydrochlorothiazide (HYDRODIURIL) 25 MG tablet TAKE ONE TABLET BY MOUTH DAILY. 04/15/17   Leone Haven, MD  linagliptin [facility])  5 MG TABS tablet Take 5 mg by mouth daily.    [provider]  lisinopril (PRINIVIL,ZESTRIL) 5 MG tablet Take 1 tablet (5 mg total) by mouth daily. 03/20/19 03/19/20  Carrie Mew, MD  meloxicam (MOBIC) 7.5 MG tablet Take 15 mg by mouth daily.  10/31/18   [provider]  phentermine (ADIPEX-P) 37.5 MG tablet Take 1 tablet (37.5 mg total) by mouth daily before breakfast. 07/04/16   Leone Haven, MD  valACYclovir (VALTREX) 500 MG tablet TAKE 1 TABLET (500 MG TOTAL) BY MOUTH 2 (TWO) TIMES DAILY. Patient taking differently: Take 500 mg  by mouth 2 (two) times daily as needed (for fever blisters or as directed).  10/17/16   Leone Haven, MD  VITAMIN E PO Take 1 capsule by mouth daily.    [provider]     Allergies Patient has no known allergies.   Family History  Problem Relation Age of Onset  . Cancer Father        colon and prostate  . Diabetes Father   . Breast cancer Other     Social History Social History   Tobacco Use  . Smoking status: Never Smoker  . Smokeless tobacco: Never Used  Substance Use Topics  . Alcohol use: Yes    Alcohol/week: 0.0 standard drinks  . Drug use: No    Review of Systems  Constitutional:   No fever or chills.  ENT:   No sore throat. No rhinorrhea. Cardiovascular:   No chest pain or syncope.  Positive palpitations, now resolved. Respiratory:   No dyspnea or cough. Gastrointestinal:   Negative for abdominal pain, vomiting and diarrhea.  Musculoskeletal:   Negative for focal pain or swelling All other systems reviewed and are negative except as documented above in ROS and HPI.  ____________________________________________   PHYSICAL EXAM:  VITAL SIGNS: ED Triage Vitals  Enc Vitals Group     BP 03/20/19 1500 (!) 158/83     Pulse Rate 03/20/19 1500 81     Resp 03/20/19 1500 18     Temp --      Temp src --      SpO2 03/20/19 1500 98 %     Weight 03/20/19 1502 220 lb 10.9 oz (100.1 kg)     Height 03/20/19 1508 5\' 5"  (1.651 m)     Head Circumference --      Peak Flow --      Pain Score 03/20/19 1507 0     Pain Loc --      Pain Edu? --      Excl. in [place]? --     Vital signs reviewed, nursing assessments reviewed.   Constitutional:   Alert and oriented. Non-toxic appearance. Eyes:   Conjunctivae are normal. EOMI. PERRL. ENT      Head:   Normocephalic and atraumatic.      Nose:   No congestion/rhinnorhea.       Mouth/Throat:   MMM, no pharyngeal erythema. No peritonsillar mass.       Neck:   No meningismus. Full  ROM. Hematological/Lymphatic/Immunilogical:   No cervical lymphadenopathy. Cardiovascular:   RRR. Symmetric bilateral radial and DP pulses.  No murmurs. Cap refill less than 2 seconds. Respiratory:   Normal respiratory effort without tachypnea/retractions. Breath sounds are clear and equal bilaterally. No wheezes/rales/rhonchi. Gastrointestinal:   Soft and nontender. Non distended. There is no CVA tenderness.  No rebound, rigidity, or guarding. Genitourinary:   deferred Musculoskeletal:   Normal range of  motion in all extremities. No joint effusions.  No lower extremity tenderness.  No edema. Neurologic:   Normal speech and language.  Cranial nerves III through XII intact Normal finger-to-nose and cerebellar function Motor grossly intact. Symmetric sensation NIH stroke scale 0 No acute focal neurologic deficits are appreciated.  Skin:    Skin is warm, dry and intact. No rash noted.  No petechiae, purpura, or bullae.  ____________________________________________    LABS (pertinent positives/negatives) (all labs ordered are listed, but only abnormal results are displayed) Labs Reviewed  COMPREHENSIVE METABOLIC PANEL - Abnormal; Notable for the following components:      Result Value   Glucose, Bld 179 (*)    All other components within normal limits  GLUCOSE, CAPILLARY - Abnormal; Notable for the following components:   Glucose-Capillary 174 (*)    All other components within normal limits  LIPID PANEL - Abnormal; Notable for the following components:   Triglycerides 210 (*)    VLDL 42 (*)    All other components within normal limits  PROTIME-INR  APTT  CBC  DIFFERENTIAL  HEMOGLOBIN A1C  CBG MONITORING, ED  I-STAT CREATININE, ED  POC URINE PREG, ED   ____________________________________________   EKG  Interpreted by me Sinus rhythm rate of 74, left axis, normal intervals.  Normal QRS ST segments and T waves.  No acute ischemic  changes  ____________________________________________    RADIOLOGY  Mr Brain Wo Contrast  Result Date: 03/20/2019 CLINICAL DATA:  Initial evaluation for acute onset left arm and leg numbness with weakness. Evaluate for stroke. EXAM: MRI HEAD WITHOUT CONTRAST TECHNIQUE: Multiplanar, multiecho pulse sequences of the brain and surrounding structures were obtained without intravenous contrast. COMPARISON:  Prior CT from earlier same day as well as previous MRI from 12/17/2018. FINDINGS: Brain: Cerebral volume within normal limits for age. Few scattered subcentimeter T2/FLAIR hyperintensities noted within the periventricular deep white matter both cerebral hemispheres, nonspecific, but felt to be within normal limits for age. There is a small chronic lacunar infarct seen involving the lateral aspect of the right lentiform nucleus (series 15, image 30). While chronic in appearance, this is new relative to previous MRI from January of 2020. Finding corresponds with small focal abnormality seen on prior CT. No evidence for acute or subacute infarct. Gray-white matter differentiation otherwise maintained. No encephalomalacia to suggest chronic cortical infarction. No evidence for acute intracranial hemorrhage. Single punctate chronic microhemorrhage noted at the left external capsule. No other evidence for chronic intracranial hemorrhage. No mass lesion, midline shift or mass effect. No hydrocephalus. No extra-axial fluid collection. Pituitary gland suprasellar region normal. Midline structures intact and normal. Vascular: Major intracranial vascular flow voids are maintained. Skull and upper cervical spine: Craniocervical junction normal. No focal marrow replacing lesion. No scalp soft tissue abnormality. Sinuses/Orbits: Globes and orbital soft tissues within normal limits. Paranasal sinuses are clear. No mastoid effusion. Inner ear structures normal. Other: None. IMPRESSION: 1. No acute intracranial infarct or  other abnormality identified. 2. 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 While this is chronic in appearance, this is new relative to most recent MRI from 12/17/2018. Electronically Signed   By: Jeannine Boga M.D.   On: 03/20/2019 18:06   Ct Head Code Stroke Wo Contrast  Result Date: 03/20/2019 CLINICAL DATA:  Code stroke. Numbness, tingling and weakening of the left arm beginning 30 minutes ago. Some weakness of the left leg. EXAM: CT HEAD WITHOUT CONTRAST TECHNIQUE: Contiguous axial images  were obtained from the base of the skull through the vertex without intravenous contrast. COMPARISON:  CT and MRI 12/17/2018 FINDINGS: Brain: No sign of definite acute infarction, mass lesion, hemorrhage, hydrocephalus or extra-axial collection. Question small hypodensities in the region of the basal ganglia/internal capsule, not appreciable on the previous exams. Consider MRI. Vascular: No abnormal vascular finding. Skull: Negative Sinuses/Orbits: Clear/normal Other: None ASPECTS (Pease Stroke Program Early CT Score) - Ganglionic level infarction (caudate, lentiform nuclei, internal capsule, insula, M1-M3 cortex): 6 or 7 - Supraganglionic infarction (M4-M6 cortex): 3 Total score (0-10 with 10 being normal): 9 or 10 IMPRESSION: 1. No definite finding. No cortical abnormality. One could question small low densities in the basal ganglia region, but these are not definite. Consider MRI for confirmation. 2. ASPECTS is 9 or 10 3. These results were called by telephone at the time of interpretation on 03/20/2019 at 3:04 pm to Dr. Rudene Re , who verbally acknowledged these results. Electronically Signed   By: Nelson Chimes M.D.   On: 03/20/2019 15:06    ____________________________________________   PROCEDURES Procedures  ____________________________________________  DIFFERENTIAL DIAGNOSIS   Acute ischemic stroke, intracranial  hemorrhage, anxiety, paroxysmal atrial fibrillation  CLINICAL IMPRESSION / ASSESSMENT AND PLAN / ED COURSE  Medications ordered in the ED: Medications  sodium chloride flush (NS) 0.9 % injection 3 mL (has no administration in time range)    Pertinent labs & imaging results that were available during my care of the patient were reviewed by me and considered in my medical decision making (see chart for details).  Natalie Greene was evaluated in Emergency Department on 03/20/2019 for the symptoms described in the history of present illness. She was evaluated in the context of the global COVID-19 pandemic, which necessitated consideration that the patient might be at risk for infection with the SARS-CoV-2 virus that causes COVID-19. Institutional protocols and algorithms that pertain to the evaluation of patients at risk for COVID-19 are in a state of rapid change based on information released by regulatory bodies including the CDC and federal and state organizations. These policies and algorithms were followed during the patient's care in the ED.   Patient presents with episode of palpitations and neurologic symptoms.  Currently symptom-free and feels back to normal.  She reports that this episode was not as bad as her episode in January 1 of 2020 and she was not sure if she should come to the hospital to be evaluated again for the similar symptoms or not.  CT scan reviewed which does show some abnormality of the basal ganglia.  Neurology consult note reviewed, suspect this may be artifact.  With patient being asymptomatic and stroke scale of 0, I agree with neurology recommendation to obtain an MRI.  If there are no acute findings I think patient can continue her outpatient follow-up and I encouraged her to return her Holter monitor to cardiology clinic as soon as possible for processing.  Given the recurrent symptoms, will recommend that she take a daily aspirin if she is not already.  Clinical Course  as of Mar 20 1823  Fri Mar 20, 2019  1555 WBC: 10.1 [PS]  1756 Neurology consult note reviewed.  MRI reported as unremarkable, we will follow-up formal radiology report.  Plan to increase lisinopril to 5 mg daily and Lipitor to 20 mg daily as recommended by neurology.  Continue daily baby aspirin.   [PS]    Clinical Course User Index [PS] Carrie Mew, MD     -----------------------------------------  6:24 PM on 03/20/2019 -----------------------------------------  Results discussed with patient who agrees with outpatient follow-up.  New prescriptions for her dosage adjustments.  ____________________________________________   FINAL CLINICAL IMPRESSION(S) / ED DIAGNOSES    Final diagnoses:  TIA (transient ischemic attack)     ED Discharge Orders         Ordered    lisinopril (PRINIVIL,ZESTRIL) 5 MG tablet  Daily     03/20/19 1823    atorvastatin (LIPITOR) 20 MG tablet  Daily     03/20/19 1823          Portions of this note were generated with dragon dictation software. Dictation errors may occur despite best attempts at proofreading.   Carrie Mew, MD 03/20/19 Jeri Lager

## 2019-03-20 NOTE — ED Notes (Signed)
[place] Code stroke called 32 Dr Nancee Liter cleared for CT 1452 arrived to cT with Mateo Flow RN

## 2019-03-20 NOTE — ED Notes (Addendum)
Started with numbness, tingling, and weakness to left arm 30 min ago. Left leg feels weak and balance off per pt. Jan had TIA.  215 sx started.  Code stroke protocols initiated. Will take pt for MD clearance and CT

## 2019-03-20 NOTE — Discharge Instructions (Addendum)
Your CT scan and MRI today do not show any acute stroke, but there are findings of a small prior stroke. Increase your lisinopril and lipitor, as recommended by our neurology consultant, and please follow up with your doctors.

## 2019-03-20 NOTE — Progress Notes (Signed)
I reviewed the MRI Brain personally.  There is no acute infarct.  Lesions on CT were artifact.    Patient can be discharged home to follow up with neurology outpatient (see consult note for other instructions).    Rogue Jury, MS, MD

## 2019-03-20 NOTE — Progress Notes (Signed)
   03/20/19 1510  Clinical Encounter Type  Visited With Patient  Visit Type Initial  Referral From Nurse  Consult/Referral To Chaplain  Spiritual Encounters  Spiritual Needs Prayer;Emotional  Advance Directives (For Healthcare)  Does Patient Have a Medical Advance Directive? No  Mental Health Advance Directives  Does Patient Have a Mental Health Advance Directive? No  Chaplain received page for code stroke and went to patient room. Patient was being access by nurse. Chaplain made her presence known and ask patient was there anything she could do and patient said no thank you for coming. Chaplain gave blessings and left.

## 2019-03-20 NOTE — ED Notes (Signed)
HX of TIA in January. Today 1415 was last known well. L arm numbness, tingling, weakness, weakness in R leg, feeling off balance.

## 2019-03-20 NOTE — Consult Note (Signed)
Reason for Consult: Code Stroke Referring Physician: ER  Natalie Greene is an 48 y.o. female.  HPI: Acute onset of left arm and leg numbness and mild weakness at about 2 pm while sitting behind desk at work.  There was also associated chest sharp pain short duration and feeling of palpitations.  The symptoms have resolved at this time mostly.  She does have a history of anxiety and takes Xanax as needed and Wellbutrin XL.  She apparently had a TIA in 12/2018 when she developed a left lateral rectus palsy based on her description and left arm and leg numbness and weakness.  She was started on ASA 81 mg qd then and has been compliant.  She also takes Lipitor 10 mg qd.  She has been compliant with DM med Tradjenta, but does not remember last HgA1c.  She is a non-smoker and denies any illicit drug use.  BP was initially a little high, but now is about 144/80. She takes Lisinopril 2.5 mg qd FSG was 174.    I reviewed her CT Brain.  There is no hemorrhage.  There are 2 areas of hypodensity in the right basal ganglia and left caudate nucleus which I suspect are artifact.  MRI Brain in 12/2018 was normal.  CTAngiogram of brain and neck were normal in 12/2018.    Past Medical History:  Diagnosis Date  . Anxiety   . Cancer ([place])   . Diabetes mellitus without complication ([place])   . GERD (gastroesophageal reflux disease)   . Hypertension   . Lupus ([place])   . Squamous cell carcinoma     Past Surgical History:  Procedure Laterality Date  . ABDOMINAL HYSTERECTOMY     approx 5 years ago from 2017   . CHOLECYSTECTOMY    . WISDOM TOOTH EXTRACTION      Family History  Problem Relation Age of Onset  . Cancer Father        colon and prostate  . Diabetes Father   . Breast cancer Other     Social History:  reports that she has never smoked. She has never used smokeless tobacco. She reports current alcohol use. She reports that she does not use drugs.  Allergies: No Known Allergies  Prior to Admission  medications   Medication Sig Start Date End Date Taking? Authorizing Provider  ALPRAZolam (XANAX) 1 MG tablet TAKE ONE TABLET BY MOUTH AT BEDTIME AS NEEDED FOR ANXIETY Patient taking differently: Take 1 mg by mouth at bedtime as needed for sleep.  10/10/16   Leone Haven, MD  Ascorbic Acid (VITAMIN C PO) Take 1 tablet by mouth daily.    [provider]  buPROPion (WELLBUTRIN XL) 150 MG 24 hr tablet Take 1 tablet (150 mg total) by mouth daily. 12/19/18   Kathrynn Ducking, MD  esomeprazole (NEXIUM) 40 MG capsule Take 1 capsule (40 mg total) by mouth daily at 12 noon. 09/04/16   Leone Haven, MD  furosemide (LASIX) 20 MG tablet Take 20 mg by mouth 2 (two) times daily as needed for fluid.    [provider]  hydrochlorothiazide (HYDRODIURIL) 25 MG tablet TAKE ONE TABLET BY MOUTH DAILY. 04/15/17   Leone Haven, MD  linagliptin (TRADJENTA) 5 MG TABS tablet Take 5 mg by mouth daily.    [provider]  meloxicam (MOBIC) 7.5 MG tablet Take 15 mg by mouth daily.  10/31/18   [provider]  phentermine (ADIPEX-P) 37.5 MG tablet Take 1 tablet (37.5  mg total) by mouth daily before breakfast. 07/04/16   Leone Haven, MD  valACYclovir (VALTREX) 500 MG tablet TAKE 1 TABLET (500 MG TOTAL) BY MOUTH 2 (TWO) TIMES DAILY. Patient taking differently: Take 500 mg by mouth 2 (two) times daily as needed (for fever blisters or as directed).  10/17/16   Leone Haven, MD  VITAMIN E PO Take 1 capsule by mouth daily.    [provider]    Medications: Prior to Admission: (Not in a hospital admission)   Results for orders placed or performed during the hospital encounter of 03/20/19 (from the past 48 hour(s))  Glucose, capillary     Status: Abnormal   Collection Time: 03/20/19  2:50 PM  Result Value Ref Range   Glucose-Capillary 174 (H) 70 - 99 mg/dL  Protime-INR     Status: None   Collection Time: 03/20/19  3:07 PM  Result Value Ref Range    Prothrombin Time 13.1 11.4 - 15.2 seconds   INR 1.0 0.8 - 1.2    Comment: (NOTE) INR goal varies based on device and disease states. Performed at [facility], [address] [place], [place] [address]   APTT     Status: None   Collection Time: 03/20/19  3:07 PM  Result Value Ref Range   aPTT 31 24 - 36 seconds    Comment: Performed at [facility], [address] [place], [place] [address]  CBC     Status: None   Collection Time: 03/20/19  3:07 PM  Result Value Ref Range   WBC 10.1 4.0 - 10.5 K/uL   RBC 4.48 3.87 - 5.11 MIL/uL   Hemoglobin 12.7 12.0 - 15.0 g/dL   HCT 38.7 36.0 - 46.0 %   MCV 86.4 80.0 - 100.0 fL   MCH 28.3 26.0 - 34.0 pg   MCHC 32.8 30.0 - 36.0 g/dL   RDW 13.2 11.5 - 15.5 %   Platelets 236 150 - 400 K/uL   nRBC 0.0 0.0 - 0.2 %    Comment: Performed at [facility], [address] [place], [place] [address]  Differential     Status: None   Collection Time: 03/20/19  3:07 PM  Result Value Ref Range   Neutrophils Relative % 71 %   Neutro Abs 7.2 1.7 - 7.7 K/uL   Lymphocytes Relative 19 %   Lymphs Abs 1.9 0.7 - 4.0 K/uL   Monocytes Relative 6 %   Monocytes Absolute 0.6 0.1 - 1.0 K/uL   Eosinophils Relative 3 %   Eosinophils Absolute 0.3 0.0 - 0.5 K/uL   Basophils Relative 1 %   Basophils Absolute 0.1 0.0 - 0.1 K/uL   Immature Granulocytes 0 %   Abs Immature Granulocytes 0.03 0.00 - 0.07 K/uL    Comment: Performed at [facility], [address] [place], [place] [address]  Comprehensive metabolic panel     Status: Abnormal   Collection Time: 03/20/19  3:07 PM  Result Value Ref Range   Sodium 139 135 - 145 mmol/L   Potassium 3.6 3.5 - 5.1 mmol/L   Chloride 101 98 - 111 mmol/L   CO2 27 22 - 32 mmol/L   Glucose, Bld 179 (H) 70 - 99 mg/dL   BUN 15 6 - 20 mg/dL   Creatinine, Ser 0.91 0.44 - 1.00 mg/dL   Calcium 9.2 8.9 - 10.3 mg/dL   Total Protein 8.0 6.5 - 8.1 g/dL   Albumin 4.2 3.5 - 5.0  g/dL   AST 18 15 - 41  U/L   ALT 20 0 - 44 U/L   Alkaline Phosphatase 49 38 - 126 U/L   Total Bilirubin 0.5 0.3 - 1.2 mg/dL   GFR calc non Af Amer >60 >60 mL/min   GFR calc Af Amer >60 >60 mL/min   Anion gap 11 5 - 15    Comment: Performed at [facility], [address] [place], [place] [address]    Ct Head Code Stroke Wo Contrast  Result Date: 03/20/2019 CLINICAL DATA:  Code stroke. Numbness, tingling and weakening of the left arm beginning 30 minutes ago. Some weakness of the left leg. EXAM: CT HEAD WITHOUT CONTRAST TECHNIQUE: Contiguous axial images were obtained from the base of the skull through the vertex without intravenous contrast. COMPARISON:  CT and MRI 12/17/2018 FINDINGS: Brain: No sign of definite acute infarction, mass lesion, hemorrhage, hydrocephalus or extra-axial collection. Question small hypodensities in the region of the basal ganglia/internal capsule, not appreciable on the previous exams. Consider MRI. Vascular: No abnormal vascular finding. Skull: Negative Sinuses/Orbits: Clear/normal Other: None ASPECTS ([place] Stroke Program Early CT Score) - Ganglionic level infarction (caudate, lentiform nuclei, internal capsule, insula, M1-M3 cortex): 6 or 7 - Supraganglionic infarction (M4-M6 cortex): 3 Total score (0-10 with 10 being normal): 9 or 10 IMPRESSION: 1. No definite finding. No cortical abnormality. One could question small low densities in the basal ganglia region, but these are not definite. Consider MRI for confirmation. 2. ASPECTS is 9 or 10 3. These results were called by telephone at the time of interpretation on 03/20/2019 at 3:04 pm to Dr. Rudene Re , who verbally acknowledged these results. Electronically Signed   By: Nelson Chimes M.D.   On: 03/20/2019 15:06    ROS Blood pressure (!) 158/83, pulse 81, resp. rate 18, height 5\' 5"  (1.651 m), weight 98 kg, SpO2 98 %. Neurologic Examination:  Awake, alert, fully oriented. Language-fluent,  Comprehension, naming,  repetition- intact. Face symmetric.  Tongue midline.  EOMI.  PERL.   Strength 5/5 BUE and BLE.  No pronator drift. Sensory - pinprick intact BUE and BLE and face. Coord- FTN and HTS intact bilaterally. Gait- deferred.  Assessment/Plan:  Not a candidate for IV tPA due to near complete resolution of symptoms.  In addition, I am not convinced this was a TIA, but probable more part of an anxiety attack.  The CT findings are likely artifact, but will do an MRI Brain to ensure.  If MRI happens to be abnormal, then will change ASA to Plavix, otherwise she can continue ASA 81 mg qday.  I recommend increasing her Lisinipril to 5 mg qd to maintain better control over her BP.  HgA1c can be checked, but diabetes adjustment can be made as outpatient.  She will continue the statin, but will check LDL as goal is <70 mg/dl in someone with prior TIA or stroke.  If >70, then increase Lipitor to 20 mg qd.  Patient does not require admission or further work up unless MRI is abnormal which is highly unlikely.  Follow up in neurology clinic otherwise.    Rogue Jury, MS, MD 03/20/2019, 3:43 PM

## 2019-03-20 NOTE — ED Triage Notes (Signed)
Pt arrived to ED from work, brought in [place] by daughter. Pt complaining of L arm weakness, numbness, tingling beginning at 1415 today.

## 2019-04-09 ENCOUNTER — Ambulatory Visit: Payer: Self-pay | Admitting: Psychiatry

## 2019-04-22 ENCOUNTER — Telehealth: Payer: Self-pay

## 2019-04-22 NOTE — Telephone Encounter (Signed)
Pt has been removed from 04/22/19 scheduled due to not confirming if visit could be completed virtually. Will call back at a later time.

## 2019-04-23 ENCOUNTER — Ambulatory Visit: Payer: BLUE CROSS/BLUE SHIELD | Admitting: Neurology

## 2019-05-01 NOTE — Telephone Encounter (Signed)
error 

## 2019-05-07 ENCOUNTER — Other Ambulatory Visit: Payer: Self-pay

## 2019-05-07 ENCOUNTER — Observation Stay
Admission: EM | Admit: 2019-05-07 | Discharge: 2019-05-08 | Disposition: A | Discharge status: Home / Self Care | Payer: BLUE CROSS/BLUE SHIELD | Attending: Internal Medicine | Admitting: Internal Medicine

## 2019-05-07 ENCOUNTER — Observation Stay: Payer: BLUE CROSS/BLUE SHIELD

## 2019-05-07 ENCOUNTER — Emergency Department: Payer: BLUE CROSS/BLUE SHIELD

## 2019-05-07 DIAGNOSIS — Z794 Long term (current) use of insulin: Secondary | ICD-10-CM | POA: Diagnosis not present

## 2019-05-07 DIAGNOSIS — Z1159 Encounter for screening for other viral diseases: Secondary | ICD-10-CM | POA: Diagnosis not present

## 2019-05-07 DIAGNOSIS — Z7982 Long term (current) use of aspirin: Secondary | ICD-10-CM | POA: Insufficient documentation

## 2019-05-07 DIAGNOSIS — E785 Hyperlipidemia, unspecified: Secondary | ICD-10-CM | POA: Insufficient documentation

## 2019-05-07 DIAGNOSIS — F419 Anxiety disorder, unspecified: Secondary | ICD-10-CM | POA: Insufficient documentation

## 2019-05-07 DIAGNOSIS — M329 Systemic lupus erythematosus, unspecified: Secondary | ICD-10-CM | POA: Diagnosis not present

## 2019-05-07 DIAGNOSIS — G459 Transient cerebral ischemic attack, unspecified: Secondary | ICD-10-CM | POA: Diagnosis present

## 2019-05-07 DIAGNOSIS — Z6841 Body Mass Index (BMI) 40.0 and over, adult: Secondary | ICD-10-CM | POA: Diagnosis not present

## 2019-05-07 DIAGNOSIS — G43109 Migraine with aura, not intractable, without status migrainosus: Secondary | ICD-10-CM | POA: Insufficient documentation

## 2019-05-07 DIAGNOSIS — K219 Gastro-esophageal reflux disease without esophagitis: Secondary | ICD-10-CM | POA: Diagnosis not present

## 2019-05-07 DIAGNOSIS — Z79899 Other long term (current) drug therapy: Secondary | ICD-10-CM | POA: Insufficient documentation

## 2019-05-07 DIAGNOSIS — Z8673 Personal history of transient ischemic attack (TIA), and cerebral infarction without residual deficits: Secondary | ICD-10-CM | POA: Diagnosis not present

## 2019-05-07 DIAGNOSIS — I10 Essential (primary) hypertension: Secondary | ICD-10-CM | POA: Insufficient documentation

## 2019-05-07 DIAGNOSIS — R531 Weakness: Secondary | ICD-10-CM | POA: Diagnosis not present

## 2019-05-07 DIAGNOSIS — E119 Type 2 diabetes mellitus without complications: Secondary | ICD-10-CM | POA: Diagnosis not present

## 2019-05-07 DIAGNOSIS — Z791 Long term (current) use of non-steroidal anti-inflammatories (NSAID): Secondary | ICD-10-CM | POA: Insufficient documentation

## 2019-05-07 HISTORY — DX: Cerebral infarction, unspecified: I63.9

## 2019-05-07 LAB — URINALYSIS, ROUTINE W REFLEX MICROSCOPIC
Bilirubin Urine: NEGATIVE
Glucose, UA: NEGATIVE mg/dL
Hgb urine dipstick: NEGATIVE
Ketones, ur: NEGATIVE mg/dL
Leukocytes,Ua: NEGATIVE
Nitrite: NEGATIVE
Protein, ur: NEGATIVE mg/dL
Specific Gravity, Urine: 1.003 — ABNORMAL LOW (ref 1.005–1.030)
pH: 6 (ref 5.0–8.0)

## 2019-05-07 LAB — COMPREHENSIVE METABOLIC PANEL
ALT: 16 U/L (ref 0–44)
AST: 15 U/L (ref 15–41)
Albumin: 4.1 g/dL (ref 3.5–5.0)
Alkaline Phosphatase: 59 U/L (ref 38–126)
Anion gap: 11 (ref 5–15)
BUN: 12 mg/dL (ref 6–20)
CO2: 27 mmol/L (ref 22–32)
Calcium: 8.9 mg/dL (ref 8.9–10.3)
Chloride: 101 mmol/L (ref 98–111)
Creatinine, Ser: 0.7 mg/dL (ref 0.44–1.00)
GFR calc Af Amer: >60 mL/min (ref >60)
GFR calc non Af Amer: >60 mL/min (ref >60)
Glucose, Bld: 160 mg/dL — ABNORMAL HIGH (ref 70–99)
Potassium: 4.1 mmol/L (ref 3.5–5.1)
Sodium: 139 mmol/L (ref 135–145)
Total Bilirubin: 0.5 mg/dL (ref 0.3–1.2)
Total Protein: 7.9 g/dL (ref 6.5–8.1)

## 2019-05-07 LAB — CBC
HCT: 37.9 % (ref 36.0–46.0)
Hemoglobin: 12.7 g/dL (ref 12.0–15.0)
MCH: 29 pg (ref 26.0–34.0)
MCHC: 33.5 g/dL (ref 30.0–36.0)
MCV: 86.5 fL (ref 80.0–100.0)
Platelets: 250 10*3/uL (ref 150–400)
RBC: 4.38 MIL/uL (ref 3.87–5.11)
RDW: 12.8 % (ref 11.5–15.5)
WBC: 7.8 10*3/uL (ref 4.0–10.5)
nRBC: 0 % (ref 0.0–0.2)

## 2019-05-07 LAB — URINE DRUG SCREEN, QUALITATIVE (ARMC ONLY)
Amphetamines, Ur Screen: NOT DETECTED
Barbiturates, Ur Screen: NOT DETECTED
Benzodiazepine, Ur Scrn: NOT DETECTED
Cannabinoid 50 Ng, Ur ~~LOC~~: NOT DETECTED
Cocaine Metabolite,Ur ~~LOC~~: NOT DETECTED
MDMA (Ecstasy)Ur Screen: NOT DETECTED
Methadone Scn, Ur: NOT DETECTED
Opiate, Ur Screen: NOT DETECTED
Phencyclidine (PCP) Ur S: NOT DETECTED
Tricyclic, Ur Screen: NOT DETECTED

## 2019-05-07 LAB — DIFFERENTIAL
Abs Immature Granulocytes: 0.02 10*3/uL (ref 0.00–0.07)
Basophils Absolute: 0.1 10*3/uL (ref 0.0–0.1)
Basophils Relative: 1 %
Eosinophils Absolute: 0.2 10*3/uL (ref 0.0–0.5)
Eosinophils Relative: 2 %
Immature Granulocytes: 0 %
Lymphocytes Relative: 24 %
Lymphs Abs: 1.9 10*3/uL (ref 0.7–4.0)
Monocytes Absolute: 0.5 10*3/uL (ref 0.1–1.0)
Monocytes Relative: 6 %
Neutro Abs: 5.2 10*3/uL (ref 1.7–7.7)
Neutrophils Relative %: 67 %

## 2019-05-07 LAB — SARS CORONAVIRUS 2 BY RT PCR (HOSPITAL ORDER, PERFORMED IN ~~LOC~~ HOSPITAL LAB): SARS Coronavirus 2: NEGATIVE

## 2019-05-07 LAB — PROTIME-INR
INR: 0.9 (ref 0.8–1.2)
Prothrombin Time: 12 seconds (ref 11.4–15.2)

## 2019-05-07 LAB — GLUCOSE, CAPILLARY
Glucose-Capillary: 147 mg/dL — ABNORMAL HIGH (ref 70–99)
Glucose-Capillary: 102 mg/dL — ABNORMAL HIGH (ref 70–99)
Glucose-Capillary: 203 mg/dL — ABNORMAL HIGH (ref 70–99)

## 2019-05-07 LAB — ETHANOL: Alcohol, Ethyl (B): <10 mg/dL (ref <10)

## 2019-05-07 LAB — APTT: aPTT: 29 seconds (ref 24–36)

## 2019-05-07 LAB — POCT PREGNANCY, URINE: Preg Test, Ur: NEGATIVE

## 2019-05-07 LAB — TROPONIN I: Troponin I: <0.03 ng/mL (ref <0.03)

## 2019-05-07 MED ORDER — ASPIRIN 325 MG PO TABS
325.0000 mg | ORAL_TABLET | Freq: Every day | ORAL | Status: DC
  Administered 2019-05-08: 325 mg via ORAL
  Filled 2019-05-07 (×2): qty 1

## 2019-05-07 MED ORDER — ALPRAZOLAM 0.5 MG PO TABS
1.0000 mg | ORAL_TABLET | Freq: Every evening | ORAL | Status: DC | PRN
  Administered 2019-05-07: 1 mg via ORAL
  Filled 2019-05-07: qty 2

## 2019-05-07 MED ORDER — ACETAMINOPHEN 650 MG RE SUPP: 650.0000 mg | RECTAL | Status: DC | PRN

## 2019-05-07 MED ORDER — FUROSEMIDE 40 MG PO TABS
20.0000 mg | ORAL_TABLET | Freq: Every day | ORAL | Status: DC
  Administered 2019-05-08: 20 mg via ORAL
  Filled 2019-05-07: qty 1

## 2019-05-07 MED ORDER — SENNOSIDES-DOCUSATE SODIUM 8.6-50 MG PO TABS: 1.0000 | ORAL_TABLET | Freq: Every evening | ORAL | Status: DC | PRN

## 2019-05-07 MED ORDER — MELOXICAM 7.5 MG PO TABS
7.5000 mg | ORAL_TABLET | Freq: Two times a day (BID) | ORAL | Status: DC
  Administered 2019-05-08: 7.5 mg via ORAL
  Filled 2019-05-07: qty 1

## 2019-05-07 MED ORDER — VITAMIN C 500 MG PO TABS
500.0000 mg | ORAL_TABLET | Freq: Every day | ORAL | Status: DC
  Administered 2019-05-08: 500 mg via ORAL
  Filled 2019-05-07: qty 1

## 2019-05-07 MED ORDER — INSULIN ASPART 100 UNIT/ML ~~LOC~~ SOLN
0.0000 [IU] | Freq: Three times a day (TID) | SUBCUTANEOUS | Status: DC
  Administered 2019-05-08: 1 [IU] via SUBCUTANEOUS
  Filled 2019-05-07 (×2): qty 1

## 2019-05-07 MED ORDER — ENOXAPARIN SODIUM 40 MG/0.4ML ~~LOC~~ SOLN
40.0000 mg | SUBCUTANEOUS | Status: DC
  Administered 2019-05-08: 40 mg via SUBCUTANEOUS
  Filled 2019-05-07: qty 0.4

## 2019-05-07 MED ORDER — LINAGLIPTIN 5 MG PO TABS
5.0000 mg | ORAL_TABLET | Freq: Every day | ORAL | Status: DC
  Administered 2019-05-08: 5 mg via ORAL
  Filled 2019-05-07: qty 1

## 2019-05-07 MED ORDER — ACETAMINOPHEN 160 MG/5ML PO SOLN
650.0000 mg | ORAL | Status: DC | PRN
  Filled 2019-05-07: qty 20.3

## 2019-05-07 MED ORDER — STROKE: EARLY STAGES OF RECOVERY BOOK: Freq: Once | Status: DC

## 2019-05-07 MED ORDER — INSULIN ASPART 100 UNIT/ML ~~LOC~~ SOLN
0.0000 [IU] | Freq: Every day | SUBCUTANEOUS | Status: DC
  Administered 2019-05-07: 2 [IU] via SUBCUTANEOUS
  Filled 2019-05-07: qty 1

## 2019-05-07 MED ORDER — BUPROPION HCL ER (XL) 150 MG PO TB24: 150.0000 mg | ORAL_TABLET | Freq: Every day | ORAL | Status: DC

## 2019-05-07 MED ORDER — ESCITALOPRAM OXALATE 10 MG PO TABS
10.0000 mg | ORAL_TABLET | Freq: Every day | ORAL | Status: DC
  Administered 2019-05-08: 10 mg via ORAL
  Filled 2019-05-07 (×2): qty 1

## 2019-05-07 MED ORDER — ATORVASTATIN CALCIUM 20 MG PO TABS: 40.0000 mg | ORAL_TABLET | Freq: Every day | ORAL | Status: DC

## 2019-05-07 MED ORDER — VALACYCLOVIR HCL 500 MG PO TABS
500.0000 mg | ORAL_TABLET | Freq: Two times a day (BID) | ORAL | Status: DC | PRN
  Filled 2019-05-07: qty 1

## 2019-05-07 MED ORDER — ACETAMINOPHEN 325 MG PO TABS: 650.0000 mg | ORAL_TABLET | ORAL | Status: DC | PRN

## 2019-05-07 MED ORDER — PANTOPRAZOLE SODIUM 40 MG PO TBEC
40.0000 mg | DELAYED_RELEASE_TABLET | Freq: Every day | ORAL | Status: DC
  Administered 2019-05-08: 40 mg via ORAL
  Filled 2019-05-07: qty 1

## 2019-05-07 NOTE — Progress Notes (Signed)
Patient's belonging sent to safe with security, key is on the patient's hard chart.

## 2019-05-07 NOTE — Consult Note (Signed)
Referring Physician: Corky Downs    Chief Complaint: Left sided weakness and difficulty with speech  HPI: Natalie Greene is an 48 y.o. female with multiple problems who presents with complaints of blurry vision, difficulty with speech and left sided weakness.  Patient reports that her first event was in January when she presented in the same way.  Work up at that time was unremarkable and presentation was felt to be secondary to anxiety.  Patient had another event in April.  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 Patient had another event today.  Again started on yesterday with extreme fatigue and difficulty with gait.  Today noted blurry vision (which she reports is secondary to her left eye pulling to the left) and left sided weakness.  Had difficulty with speech as well.  Upon presentation here her symptoms were resolving.  Initial NIHSS of 0.  All of patient's presentations have also included headache.  Patient has no previous history of headache.      Date last known well: Date: 05/06/2019 Time last known well: Unable to determine tPA Given: No: Outside time window  Past Medical History:  Diagnosis Date  . Anxiety   . Cancer ([place])   . Diabetes mellitus without complication ([place])   . GERD (gastroesophageal reflux disease)   . Hypertension   . Lupus ([place])   . Squamous cell carcinoma   . Stroke [facility])    Stroke in Jan-2020 / TIA -April 2020    Past Surgical History:  Procedure Laterality Date  . ABDOMINAL HYSTERECTOMY     approx 5 years ago from 2017   . CHOLECYSTECTOMY    . WISDOM TOOTH EXTRACTION      Family History  Problem Relation Age of Onset  . Cancer Father        colon and prostate  . Diabetes Father   . Breast cancer Other    Social History:  reports that she has never smoked. She has never used smokeless tobacco. She reports current alcohol use. She reports that she does not use drugs.  Allergies: No  Known Allergies  Medications: I have reviewed the patient's current medications. Prior to Admission:  Prior to Admission medications   Medication Sig Start Date End Date Taking? Authorizing Provider  ALPRAZolam (XANAX) 1 MG tablet TAKE ONE TABLET BY MOUTH AT BEDTIME AS NEEDED FOR ANXIETY Patient taking differently: Take 1 mg by mouth at bedtime as needed for sleep.  10/10/16  Yes Leone Haven, MD  Ascorbic Acid (VITAMIN C PO) Take 1 tablet by mouth daily.   Yes [provider]  atorvastatin (LIPITOR) 20 MG tablet Take 1 tablet (20 mg total) by mouth daily. 03/20/19 03/19/20 Yes Carrie Mew, MD  escitalopram (LEXAPRO) 10 MG tablet Take 10 mg by mouth daily. 05/02/19  Yes [provider]  esomeprazole (NEXIUM) 40 MG capsule Take 1 capsule (40 mg total) by mouth daily at 12 noon. 09/04/16  Yes Leone Haven, MD  furosemide (LASIX) 20 MG tablet Take 20 mg by mouth daily.    Yes [provider]  linagliptin (TRADJENTA) 5 MG TABS tablet Take 5 mg by mouth daily.   Yes [provider]  lisinopril (PRINIVIL,ZESTRIL) 5 MG tablet Take 1 tablet (5 mg total) by mouth daily. 03/20/19 03/19/20 Yes Carrie Mew, MD  meloxicam (MOBIC) 7.5 MG tablet Take 7.5 mg by mouth 2 (two) times a day.    Yes [provider]  valACYclovir (VALTREX) 500 MG tablet TAKE 1 TABLET (500 MG TOTAL) BY MOUTH 2 (TWO) TIMES DAILY. Patient taking differently: Take 500 mg by mouth 2 (two) times daily as needed (for fever blisters or as directed).  10/17/16  Yes Leone Haven, MD  VITAMIN E PO Take 1 capsule by mouth daily.   Yes [provider]  buPROPion (WELLBUTRIN XL) 150 MG 24 hr tablet Take 1 tablet (150 mg total) by mouth daily. Patient not taking: Reported on 05/07/2019 12/19/18   Kathrynn Ducking, MD  hydrochlorothiazide (HYDRODIURIL) 25 MG tablet TAKE ONE TABLET BY MOUTH DAILY. Patient not taking: Reported on 05/07/2019 04/15/17   Leone Haven, MD     ROS: History obtained from the patient  General ROS:  fatigue Psychological ROS: memory difficulties, depression Ophthalmic ROS: blurry vision ENT ROS: negative for - epistaxis, nasal discharge, oral lesions, sore throat, tinnitus or vertigo Allergy and Immunology ROS: negative for - hives or itchy/watery eyes Hematological and Lymphatic ROS: negative for - bleeding problems, bruising or swollen lymph nodes Endocrine ROS: negative for - galactorrhea, hair pattern changes, polydipsia/polyuria or temperature intolerance Respiratory ROS: negative for - cough, hemoptysis, shortness of breath or wheezing Cardiovascular ROS: negative for - chest pain, dyspnea on exertion, edema or irregular heartbeat Gastrointestinal ROS: negative for - abdominal pain, diarrhea, hematemesis, nausea/vomiting or stool incontinence Genito-Urinary ROS: negative for - dysuria, hematuria, incontinence or urinary frequency/urgency Musculoskeletal ROS: jaw pain Neurological ROS: as noted in HPI, intermittent episodes of numbness in either hand Dermatological ROS: negative for rash and skin lesion changes  Physical Examination: Blood pressure (!) 156/87, pulse (!) 58, temperature 98.4 F (36.9 C), temperature source Oral, resp. rate 18, height 5\' 1"  (1.549 m), weight 91 kg, SpO2 100 %.  HEENT-  Normocephalic, no lesions, without obvious abnormality.  Normal external eye and conjunctiva.  Normal TM's bilaterally.  Normal auditory canals and external ears. Normal external nose, mucus membranes and septum.  Normal pharynx. Cardiovascular- S1, S2 normal, pulses palpable throughout   Lungs- chest clear, no wheezing, rales, normal symmetric air entry Abdomen- soft, non-tender; bowel sounds normal; no masses,  no organomegaly Extremities- no edema Lymph-no adenopathy palpable Musculoskeletal-no joint tenderness, deformity or swelling Skin-warm and dry, no hyperpigmentation, vitiligo, or suspicious  lesions  Neurological Examination   Mental Status: Alert, oriented, thought content appropriate.  Speech fluent without evidence of aphasia.  Able to follow 3 step commands without difficulty. Cranial Nerves: II: Discs flat bilaterally; Visual fields grossly normal, pupils equal, round, reactive to light and accommodation III,IV, VI: mild left ptosis, extra-ocular motions intact bilaterally V,VII: smile symmetric, facial light touch sensation decreased on the left side of the face VIII: hearing normal bilaterally IX,X: gag reflex present XI: bilateral shoulder shrug XII: midline tongue extension Motor: Right : Upper extremity   5/5    Left:     Upper extremity   5/5  Lower extremity   5/5     Lower extremity   5/5 Tone and bulk:normal tone throughout; no atrophy noted Sensory: Pinprick and light touch intact throughout, bilaterally Deep Tendon Reflexes: 2+ and symmetric throughout Plantars: Right: downgoing   Left: downgoing Cerebellar: Normal finger-to-nose and normal heel-to-shin testing bilaterally Gait: not tested due to safety concerns   Laboratory Studies:  Basic Metabolic Panel: Recent Labs  Lab 05/07/19 1232  NA 139  K 4.1  CL 101  CO2 27  GLUCOSE 160*  BUN 12  CREATININE 0.70  CALCIUM 8.9  Liver Function Tests: Recent Labs  Lab 05/07/19 1232  AST 15  ALT 16  ALKPHOS 59  BILITOT 0.5  PROT 7.9  ALBUMIN 4.1   No results for input(s): LIPASE, AMYLASE in the last 168 hours. No results for input(s): AMMONIA in the last 168 hours.  CBC: Recent Labs  Lab 05/07/19 1232  WBC 7.8  NEUTROABS 5.2  HGB 12.7  HCT 37.9  MCV 86.5  PLT 250    Cardiac Enzymes: No results for input(s): CKTOTAL, CKMB, CKMBINDEX, TROPONINI in the last 168 hours.  BNP: Invalid input(s): POCBNP  CBG: Recent Labs  Lab 05/07/19 1226  GLUCAP 147*    Microbiology: Results for orders placed or performed in visit on 04/11/16  Urine culture     Status: None   Collection  Time: 04/11/16  4:54 PM  Result Value Ref Range Status   Colony Count 50,000 COLONIES/ML  Final   Organism ID, Bacteria Multiple bacterial morphotypes present, none  Final   Organism ID, Bacteria predominant. Suggest appropriate recollection if   Final   Organism ID, Bacteria clinically indicated.  Final    Coagulation Studies: Recent Labs    05/07/19 1232  LABPROT 12.0  INR 0.9    Urinalysis:  Recent Labs  Lab 05/07/19 [place]*  LABSPEC 1.003*  PHURINE 6.0  GLUCOSEU NEGATIVE  HGBUR NEGATIVE  BILIRUBINUR NEGATIVE  KETONESUR NEGATIVE  PROTEINUR NEGATIVE  NITRITE NEGATIVE  LEUKOCYTESUR NEGATIVE    Lipid Panel:    Component Value Date/Time   CHOL 178 03/20/2019 1642   TRIG 210 (H) 03/20/2019 1642   HDL 55 03/20/2019 1642   CHOLHDL 3.2 03/20/2019 1642   VLDL 42 (H) 03/20/2019 1642   LDLCALC 81 03/20/2019 1642    HgbA1C:  Lab Results  Component Value Date   HGBA1C 6.9 (H) 03/20/2019    Urine Drug Screen:      Component Value Date/Time   LABOPIA NONE DETECTED 05/07/2019 1232   COCAINSCRNUR NONE DETECTED 05/07/2019 1232   LABBENZ NONE DETECTED 05/07/2019 1232   AMPHETMU NONE DETECTED 05/07/2019 1232   THCU NONE DETECTED 05/07/2019 1232   LABBARB NONE DETECTED 05/07/2019 1232    Alcohol Level:  Recent Labs  Lab 05/07/19 [place] <10    Other results: EKG: sinus rhythm at 58 bpm.  Imaging: Ct Head Wo Contrast  Result Date: 05/07/2019 CLINICAL DATA:  Left-sided weakness EXAM: CT HEAD WITHOUT CONTRAST TECHNIQUE: Contiguous axial images were obtained from the base of the skull through the vertex without intravenous contrast. COMPARISON:  MRI head 03/20/2019 FINDINGS: Brain: No evidence of acute infarction, hemorrhage, hydrocephalus, extra-axial collection or mass lesion/mass effect. Vascular: Negative for hyperdense vessel Skull: Negative Sinuses/Orbits: Negative Other: None IMPRESSION: Negative CT head Electronically Signed   By: Franchot Gallo M.D.   On: 05/07/2019 13:13    Assessment: 48 y.o. female presenting with recurrent episodes of left sided weakness, blurred vision and difficulty with speech.  Etiology unclear but TIA, complicated migraine and seizure on the differential.  Head CT reviewed and unremarkable.  Patient on ASA daily.   CTA from January of this year is unremarkable.  Would not repeat at this time.    Stroke Risk Factors - diabetes mellitus and hypertension  Plan: 1. Continue ASA 2. MRI brain without contrast since patient has not returned to baseline 3. PT consult, OT consult, Speech consult 4. Echocardiogram with bubble study 5. EEG 6. NPO until RN stroke swallow screen 7. Telemetry monitoring 8. Frequent  neuro checks  Alexis Goodell, MD Neurology [phone] 05/07/2019, 1:40 PM

## 2019-05-07 NOTE — ED Provider Notes (Signed)
[facility] Emergency Department Provider Note   ____________________________________________    I have reviewed the triage vital signs and the nursing notes.   HISTORY  Chief Complaint Cerebrovascular Accident     HPI Natalie Greene is a 48 y.o. female who presents with neuro deficits.  Patient reports approximately 1 hour prior to arrival she developed blurry vision, significant headache, left-sided facial droop, difficulty speaking as well as left arm numbness.  She has had this twice before, reports that she was diagnosed by MRI as having a stroke.  She reports symptoms started improving after half an hour.  She has not taken anything for this.  No fevers chills or cough  Past Medical History:  Diagnosis Date  . Anxiety   . Cancer ([place])   . Diabetes mellitus without complication ([place])   . GERD (gastroesophageal reflux disease)   . Hypertension   . Lupus ([place])   . Squamous cell carcinoma   . Stroke [facility])    Stroke in Jan-2020 / TIA -April 2020    Patient Active Problem List   Diagnosis Date Noted  . Vaginitis and vulvovaginitis 04/15/2016  . Right knee pain 03/09/2016  . GERD (gastroesophageal reflux disease) 03/09/2016  . Influenza with respiratory manifestation 02/14/2016  . H/O cold sores 06/21/2015  . Diabetes mellitus type II, controlled ([place]) 06/13/2015  . Hypokalemia 05/25/2015  . Encounter to establish care 02/07/2015  . Severe obesity (BMI >= 40) ([place]) 02/07/2015  . Generalized anxiety disorder 02/07/2015    Past Surgical History:  Procedure Laterality Date  . ABDOMINAL HYSTERECTOMY     approx 5 years ago from 2017   . CHOLECYSTECTOMY    . WISDOM TOOTH EXTRACTION      Prior to Admission medications   Medication Sig Start Date End Date Taking? Authorizing Provider  ALPRAZolam (XANAX) 1 MG tablet TAKE ONE TABLET BY MOUTH AT BEDTIME AS NEEDED FOR ANXIETY Patient taking differently: Take 1 mg by mouth at bedtime as needed  for sleep.  10/10/16  Yes Leone Haven, MD  Ascorbic Acid (VITAMIN C PO) Take 1 tablet by mouth daily.   Yes [provider]  atorvastatin (LIPITOR) 20 MG tablet Take 1 tablet (20 mg total) by mouth daily. 03/20/19 03/19/20 Yes Carrie Mew, MD  escitalopram (LEXAPRO) 10 MG tablet Take 10 mg by mouth daily. 05/02/19  Yes [provider]  esomeprazole (NEXIUM) 40 MG capsule Take 1 capsule (40 mg total) by mouth daily at 12 noon. 09/04/16  Yes Leone Haven, MD  furosemide (LASIX) 20 MG tablet Take 20 mg by mouth daily.    Yes [provider]  linagliptin (TRADJENTA) 5 MG TABS tablet Take 5 mg by mouth daily.   Yes [provider]  lisinopril (PRINIVIL,ZESTRIL) 5 MG tablet Take 1 tablet (5 mg total) by mouth daily. 03/20/19 03/19/20 Yes Carrie Mew, MD  meloxicam (MOBIC) 7.5 MG tablet Take 7.5 mg by mouth 2 (two) times a day.    Yes [provider]  valACYclovir (VALTREX) 500 MG tablet TAKE 1 TABLET (500 MG TOTAL) BY MOUTH 2 (TWO) TIMES DAILY. Patient taking differently: Take 500 mg by mouth 2 (two) times daily as needed (for fever blisters or as directed).  10/17/16  Yes Leone Haven, MD  VITAMIN E PO Take 1 capsule by mouth daily.   Yes [provider]  buPROPion (WELLBUTRIN XL) 150 MG 24 hr tablet Take 1 tablet (150 mg total) by mouth daily. Patient  not taking: Reported on 05/07/2019 12/19/18   Kathrynn Ducking, MD  hydrochlorothiazide (HYDRODIURIL) 25 MG tablet TAKE ONE TABLET BY MOUTH DAILY. Patient not taking: Reported on 05/07/2019 04/15/17   Leone Haven, MD     Allergies Patient has no known allergies.  Family History  Problem Relation Age of Onset  . Cancer Father        colon and prostate  . Diabetes Father   . Breast cancer Other     Social History Social History   Tobacco Use  . Smoking status: Never Smoker  . Smokeless tobacco: Never Used  Substance Use Topics  . Alcohol use: Yes    Alcohol/week:  0.0 standard drinks  . Drug use: No    Review of Systems  Constitutional: No fever/chills Eyes: As above ENT: No sore throat. Cardiovascular: Denies chest pain. Respiratory: Denies shortness of breath. Gastrointestinal: No abdominal pain.   Genitourinary: Negative for dysuria. Musculoskeletal: Negative for back pain. Skin: Negative for rash. Neurological: As above   ____________________________________________   PHYSICAL EXAM:  VITAL SIGNS: ED Triage Vitals  Enc Vitals Group     BP 05/07/19 1230 (!) 160/112     Pulse Rate 05/07/19 1230 (!) 57     Resp 05/07/19 1230 11     Temp 05/07/19 1230 98.4 F (36.9 C)     Temp Source 05/07/19 1230 Oral     SpO2 05/07/19 1230 100 %     Weight 05/07/19 1231 91 kg (200 lb 11 oz)     Height 05/07/19 1231 1.549 m (5\' 1" )     Head Circumference --      Peak Flow --      Pain Score 05/07/19 1231 0     Pain Loc --      Pain Edu? --      Excl. in [place]? --     Constitutional: Alert and oriented.  Eyes: Conjunctivae are normal.  PERRLA, EOMI Head: Atraumatic. Nose: No congestion/rhinnorhea. Mouth/Throat: Mucous membranes are moist.   Neck:  Painless ROM Cardiovascular: Normal rate, regular rhythm. Grossly normal heart sounds.  Good peripheral circulation. Respiratory: Normal respiratory effort.  No retractions. Lungs CTAB. Gastrointestinal: Soft and nontender. No distention.  No CVA tenderness.  Musculoskeletal: No lower extremity tenderness nor edema.  Warm and well perfused Neurologic:  Normal speech and language. No gross focal neurologic deficits are appreciated.  Cranial nerves II through XII normal Skin:  Skin is warm, dry and intact. No rash noted. Psychiatric: Mood and affect are normal. Speech and behavior are normal.  ____________________________________________   LABS (all labs ordered are listed, but only abnormal results are displayed)  Labs Reviewed  GLUCOSE, CAPILLARY - Abnormal; Notable for the following  components:      Result Value   Glucose-Capillary 147 (*)    All other components within normal limits  COMPREHENSIVE METABOLIC PANEL - Abnormal; Notable for the following components:   Glucose, Bld 160 (*)    All other components within normal limits  URINALYSIS, ROUTINE W REFLEX MICROSCOPIC - Abnormal; Notable for the following components:   Color, Urine STRAW (*)    APPearance CLEAR (*)    Specific Gravity, Urine 1.003 (*)    All other components within normal limits  SARS CORONAVIRUS 2 (HOSPITAL ORDER, [place] [facility])  ETHANOL  PROTIME-INR  APTT  CBC  DIFFERENTIAL  URINE DRUG SCREEN, QUALITATIVE ([facility] ONLY)  POC URINE PREG, ED  POCT PREGNANCY, URINE   ____________________________________________  EKG  ED ECG REPORT I, Lavonia Drafts, the attending physician, personally viewed and interpreted this ECG.  Date: 05/07/2019  Rhythm: normal sinus rhythm QRS Axis: normal Intervals: normal ST/T Wave abnormalities: normal Narrative Interpretation: no evidence of acute ischemia  ____________________________________________  RADIOLOGY  CT head unremarkable ____________________________________________   PROCEDURES  Procedure(s) performed: No  Procedures   Critical Care performed: No ____________________________________________   INITIAL IMPRESSION / ASSESSMENT AND PLAN / ED COURSE  Pertinent labs & imaging results that were available during my care of the patient were reviewed by me and considered in my medical decision making (see chart for details).  Patient presents within the window for possible acute stroke given her symptoms.  However her symptoms have rapidly resolved within about 30 minutes of symptoms starting.  Here in the emergency department she continues to have a mild headache however no focal deficits on exam, NIH stroke scale of 0.  Given rapid improvement most consistent with TIA, no indication for code stroke.  CT scan  unremarkable  Appreciate consultation by Dr. Doy Mince of neurology who recommends admission for EEG, repeat MRI    ____________________________________________   FINAL CLINICAL IMPRESSION(S) / ED DIAGNOSES  Final diagnoses:  TIA (transient ischemic attack)        Note:  This document was prepared using Dragon voice recognition software and may include unintentional dictation errors.   Lavonia Drafts, MD 05/07/19 [id number]

## 2019-05-07 NOTE — ED Notes (Signed)
MRI tech on phone to screen patient. Will be up to pick patient up.

## 2019-05-07 NOTE — ED Notes (Signed)
nuero at bedside to consult patient. Vss ct head completed.

## 2019-05-07 NOTE — ED Notes (Signed)
Report called and given to receiving nurse. Patient to floor.

## 2019-05-07 NOTE — ED Notes (Signed)
ED TO INPATIENT HANDOFF REPORT  ED Nurse Name and Phone #:    S Name/Age/Gender Natalie Greene 48 y.o. female Room/Bed: ED12A/ED12A  Code Status   Code Status: Full Code  Home/SNF/Other Home Patient oriented to: self, place, time and situation Is this baseline? Yes   Triage Complete: Triage complete  Chief Complaint stroke like symptoms  Triage Note Reports feeling stroke like symtoms that began 1 hour ago, reports stroke in jan 2020 and TIA last month. Patient with clear speech upon arrival, reports symptoms resolving upon ED arrival. MD at bedside.    Allergies No Known Allergies  Level of Care/Admitting Diagnosis ED Disposition    ED Disposition Condition [facility] Area: [place] [[id number]]  Level of Care: Med-Surg [16]  Covid Evaluation: Screening Protocol (No Symptoms)  Diagnosis: TIA (transient ischemic attack) [354656]  Admitting Physician: Otila Back [[id number]]  Attending Physician: Otila Back [[id number]]  PT Class (Do Not Modify): Observation [104]  PT Acc Code (Do Not Modify): Observation [10022]       B Medical/Surgery History Past Medical History:  Diagnosis Date  . Anxiety   . Cancer ([place])   . Diabetes mellitus without complication ([place])   . GERD (gastroesophageal reflux disease)   . Hypertension   . Lupus ([place])   . Squamous cell carcinoma   . Stroke [facility])    Stroke in Jan-2020 / TIA -April 2020   Past Surgical History:  Procedure Laterality Date  . ABDOMINAL HYSTERECTOMY     approx 5 years ago from 2017   . CHOLECYSTECTOMY    . WISDOM TOOTH EXTRACTION       A IV Location/Drains/Wounds Patient Lines/Drains/Airways Status   Active Line/Drains/Airways    Name:   Placement date:   Placement time:   Site:   Days:   Peripheral IV 03/20/19 Left Antecubital   03/20/19    1554    Antecubital   48          Intake/Output Last 24 hours No intake or output data in the 24 hours ending 05/07/19  1710  Labs/Imaging Results for orders placed or performed during the hospital encounter of 05/07/19 (from the past 48 hour(s))  Glucose, capillary     Status: Abnormal   Collection Time: 05/07/19 12:26 PM  Result Value Ref Range   Glucose-Capillary 147 (H) 70 - 99 mg/dL  Ethanol     Status: None   Collection Time: 05/07/19 12:32 PM  Result Value Ref Range   Alcohol, Ethyl (B) <10 <10 mg/dL    Comment: (NOTE) Lowest detectable limit for serum alcohol is 10 mg/dL. For medical purposes only. Performed at [facility], [address] [place], [place] [address]   Protime-INR     Status: None   Collection Time: 05/07/19 12:32 PM  Result Value Ref Range   Prothrombin Time 12.0 11.4 - 15.2 seconds   INR 0.9 0.8 - 1.2    Comment: (NOTE) INR goal varies based on device and disease states. Performed at [facility], [address] [place], [place] [address]   APTT     Status: None   Collection Time: 05/07/19 12:32 PM  Result Value Ref Range   aPTT 29 24 - 36 seconds    Comment: Performed at [facility], [address] [place], [place] [address]  CBC     Status: None   Collection Time: 05/07/19 12:32 PM  Result Value Ref Range   WBC  7.8 4.0 - 10.5 K/uL   RBC 4.38 3.87 - 5.11 MIL/uL   Hemoglobin 12.7 12.0 - 15.0 g/dL   HCT 37.9 36.0 - 46.0 %   MCV 86.5 80.0 - 100.0 fL   MCH 29.0 26.0 - 34.0 pg   MCHC 33.5 30.0 - 36.0 g/dL   RDW 12.8 11.5 - 15.5 %   Platelets 250 150 - 400 K/uL   nRBC 0.0 0.0 - 0.2 %    Comment: Performed at [facility], [address] [place], [place] [address]  Differential     Status: None   Collection Time: 05/07/19 12:32 PM  Result Value Ref Range   Neutrophils Relative % 67 %   Neutro Abs 5.2 1.7 - 7.7 K/uL   Lymphocytes Relative 24 %   Lymphs Abs 1.9 0.7 - 4.0 K/uL   Monocytes Relative 6 %   Monocytes Absolute 0.5 0.1 - 1.0 K/uL   Eosinophils Relative 2 %   Eosinophils Absolute 0.2 0.0 - 0.5 K/uL    Basophils Relative 1 %   Basophils Absolute 0.1 0.0 - 0.1 K/uL   Immature Granulocytes 0 %   Abs Immature Granulocytes 0.02 0.00 - 0.07 K/uL    Comment: Performed at [facility], [address] [place], [place] [address]  Comprehensive metabolic panel     Status: Abnormal   Collection Time: 05/07/19 12:32 PM  Result Value Ref Range   Sodium 139 135 - 145 mmol/L   Potassium 4.1 3.5 - 5.1 mmol/L   Chloride 101 98 - 111 mmol/L   CO2 27 22 - 32 mmol/L   Glucose, Bld 160 (H) 70 - 99 mg/dL   BUN 12 6 - 20 mg/dL   Creatinine, Ser 0.70 0.44 - 1.00 mg/dL   Calcium 8.9 8.9 - 10.3 mg/dL   Total Protein 7.9 6.5 - 8.1 g/dL   Albumin 4.1 3.5 - 5.0 g/dL   AST 15 15 - 41 U/L   ALT 16 0 - 44 U/L   Alkaline Phosphatase 59 38 - 126 U/L   Total Bilirubin 0.5 0.3 - 1.2 mg/dL   GFR calc non Af Amer >60 >60 mL/min   GFR calc Af Amer >60 >60 mL/min   Anion gap 11 5 - 15    Comment: Performed at [facility], [address] [place], [place] [address]  Urine Drug Screen, Qualitative     Status: None   Collection Time: 05/07/19 12:32 PM  Result Value Ref Range   Tricyclic, Ur Screen NONE DETECTED NONE DETECTED   Amphetamines, Ur Screen NONE DETECTED NONE DETECTED   MDMA (Ecstasy)Ur Screen NONE DETECTED NONE DETECTED   Cocaine Metabolite,Ur [place] NONE DETECTED NONE DETECTED   Opiate, Ur Screen NONE DETECTED NONE DETECTED   Phencyclidine (PCP) Ur S NONE DETECTED NONE DETECTED   Cannabinoid 50 Ng, Ur [place] NONE DETECTED NONE DETECTED   Barbiturates, Ur Screen NONE DETECTED NONE DETECTED   Benzodiazepine, Ur Scrn NONE DETECTED NONE DETECTED   Methadone Scn, Ur NONE DETECTED NONE DETECTED    Comment: (NOTE) Tricyclics + metabolites, urine    Cutoff 1000 ng/mL Amphetamines + metabolites, urine  Cutoff 1000 ng/mL MDMA (Ecstasy), urine              Cutoff 500 ng/mL Cocaine Metabolite, urine          Cutoff 300 ng/mL Opiate + metabolites, urine        Cutoff 300 ng/mL Phencyclidine (PCP),  urine  Cutoff 25 ng/mL Cannabinoid, urine                 Cutoff 50 ng/mL Barbiturates + metabolites, urine  Cutoff 200 ng/mL Benzodiazepine, urine              Cutoff 200 ng/mL Methadone, urine                   Cutoff 300 ng/mL The urine drug screen provides only a preliminary, unconfirmed analytical test result and should not be used for non-medical purposes. Clinical consideration and professional judgment should be applied to any positive drug screen result due to possible interfering substances. A more specific alternate chemical method must be used in order to obtain a confirmed analytical result. Gas chromatography / mass spectrometry (GC/MS) is the preferred confirmat ory method. Performed at [facility], [address] [place], [place] [address]   Urinalysis, Routine w reflex microscopic     Status: Abnormal   Collection Time: 05/07/19 12:32 PM  Result Value Ref Range   Color, Urine STRAW (A) YELLOW   APPearance CLEAR (A) CLEAR   Specific Gravity, Urine 1.003 (L) 1.005 - 1.030   pH 6.0 5.0 - 8.0   Glucose, UA NEGATIVE NEGATIVE mg/dL   Hgb urine dipstick NEGATIVE NEGATIVE   Bilirubin Urine NEGATIVE NEGATIVE   Ketones, ur NEGATIVE NEGATIVE mg/dL   Protein, ur NEGATIVE NEGATIVE mg/dL   Nitrite NEGATIVE NEGATIVE   Leukocytes,Ua NEGATIVE NEGATIVE    Comment: Performed at [facility], [address] [place], [place] [address]  Pregnancy, urine POC     Status: None   Collection Time: 05/07/19 12:39 PM  Result Value Ref Range   Preg Test, Ur NEGATIVE NEGATIVE    Comment:        THE SENSITIVITY OF THIS METHODOLOGY IS >24 mIU/mL   SARS Coronavirus 2 (CEPHEID - Performed in [place] hospital lab), Hosp Order     Status: None   Collection Time: 05/07/19  2:32 PM  Result Value Ref Range   SARS Coronavirus 2 NEGATIVE NEGATIVE    Comment: (NOTE) If result is NEGATIVE SARS-CoV-2 target nucleic acids are NOT DETECTED. The SARS-CoV-2 RNA is  generally detectable in upper and lower  respiratory specimens during the acute phase of infection. The lowest  concentration of SARS-CoV-2 viral copies this assay can detect is 250  copies / mL. A negative result does not preclude SARS-CoV-2 infection  and should not be used as the sole basis for treatment or other  patient management decisions.  A negative result may occur with  improper specimen collection / handling, submission of specimen other  than nasopharyngeal swab, presence of viral mutation(s) within the  areas targeted by this assay, and inadequate number of viral copies  (<250 copies / mL). A negative result must be combined with clinical  observations, patient history, and epidemiological information. If result is POSITIVE SARS-CoV-2 target nucleic acids are DETECTED. The SARS-CoV-2 RNA is generally detectable in upper and lower  respiratory specimens dur ing the acute phase of infection.  Positive  results are indicative of active infection with SARS-CoV-2.  Clinical  correlation with patient history and other diagnostic information is  necessary to determine patient infection status.  Positive results do  not rule out bacterial infection or co-infection with other viruses. If result is PRESUMPTIVE POSTIVE SARS-CoV-2 nucleic acids MAY BE PRESENT.   A presumptive positive result was obtained on the submitted specimen  and confirmed on repeat testing.  While 2019 novel coronavirus  (SARS-CoV-2) nucleic acids may be present in the submitted sample  additional confirmatory testing may be necessary for epidemiological  and / or clinical management purposes  to differentiate between  SARS-CoV-2 and other Sarbecovirus currently known to infect humans.  If clinically indicated additional testing with an alternate test  methodology [phone]) is advised. The SARS-CoV-2 RNA is generally  detectable in upper and lower respiratory sp ecimens during the acute  phase of  infection. The expected result is Negative. Fact Sheet for Patients:  StrictlyIdeas.no Fact Sheet for Healthcare Providers: BankingDealers.co.za This test is not yet approved or cleared by the [place] FDA and has been authorized for detection and/or diagnosis of SARS-CoV-2 by FDA under an Emergency Use Authorization (EUA).  This EUA will remain in effect (meaning this test can be used) for the duration of the COVID-19 declaration under Section 564(b)(1) of the Act, 21 U.S.C. section 360bbb-3(b)(1), unless the authorization is terminated or revoked sooner. Performed at [facility], [address] [place], [place] [address]    Ct Head Wo Contrast  Result Date: 05/07/2019 CLINICAL DATA:  Left-sided weakness EXAM: CT HEAD WITHOUT CONTRAST TECHNIQUE: Contiguous axial images were obtained from the base of the skull through the vertex without intravenous contrast. COMPARISON:  MRI head 03/20/2019 FINDINGS: Brain: No evidence of acute infarction, hemorrhage, hydrocephalus, extra-axial collection or mass lesion/mass effect. Vascular: Negative for hyperdense vessel Skull: Negative Sinuses/Orbits: Negative Other: None IMPRESSION: Negative CT head Electronically Signed   By: Franchot Gallo M.D.   On: 05/07/2019 13:13    Pending Labs Unresulted Labs (From admission, onward)    Start     Ordered   05/08/19 0500  Hemoglobin A1c  Tomorrow morning,   STAT     05/07/19 1530   05/08/19 0500  Lipid panel  Tomorrow morning,   STAT    Comments:  Fasting    05/07/19 1530   05/08/19 6314  Basic metabolic panel  Tomorrow morning,   STAT     05/07/19 1600   05/08/19 0500  CBC  Tomorrow morning,   STAT     05/07/19 1600   05/08/19 0500  Magnesium  Tomorrow morning,   STAT     05/07/19 1600   05/07/19 1534  Troponin I - Once  Once,   STAT     05/07/19 1533   05/07/19 1526  HIV antibody (Routine Testing)  Once,   STAT     05/07/19 1530           Vitals/Pain Today's Vitals   05/07/19 1500 05/07/19 1515 05/07/19 1548 05/07/19 1630  BP: 109/71   128/80  Pulse: (!) 58 (!) 58  (!) 58  Resp: 19 16  17   Temp:      TempSrc:      SpO2: 100% 100%  98%  Weight:      Height:      PainSc:   4      Isolation Precautions No active isolations  Medications Medications  buPROPion (WELLBUTRIN XL) 24 hr tablet 150 mg (has no administration in time range)  meloxicam (MOBIC) tablet 7.5 mg (has no administration in time range)  valACYclovir (VALTREX) tablet 500 mg (has no administration in time range)  atorvastatin (LIPITOR) tablet 40 mg (has no administration in time range)  furosemide (LASIX) tablet 20 mg (has no administration in time range)  ALPRAZolam (XANAX) tablet 1 mg (has no administration in time range)  escitalopram (LEXAPRO) tablet 10 mg (has  no administration in time range)  linagliptin (TRADJENTA) tablet 5 mg (has no administration in time range)  pantoprazole (PROTONIX) EC tablet 40 mg (has no administration in time range)  vitamin C (ASCORBIC ACID) tablet 500 mg (has no administration in time range)   stroke: mapping our early stages of recovery book (has no administration in time range)  acetaminophen (TYLENOL) tablet 650 mg (has no administration in time range)    Or  acetaminophen (TYLENOL) solution 650 mg (has no administration in time range)    Or  acetaminophen (TYLENOL) suppository 650 mg (has no administration in time range)  senna-docusate (Senokot-S) tablet 1 tablet (has no administration in time range)  enoxaparin (LOVENOX) injection 40 mg (has no administration in time range)  aspirin tablet 325 mg (has no administration in time range)  insulin aspart (novoLOG) injection 0-9 Units (has no administration in time range)  insulin aspart (novoLOG) injection 0-5 Units (has no administration in time range)    Mobility walks Low fall risk   Focused Assessments Neuro Assessment Handoff:  Swallow screen  pass? Yes    NIH Stroke Scale ( + Modified Stroke Scale Criteria)  Interval: Initial Level of Consciousness (1a.)   : Alert, keenly responsive LOC Questions (1b. )   +: Answers both questions correctly LOC Commands (1c. )   + : Performs both tasks correctly Best Gaze (2. )  +: Normal Visual (3. )  +: No visual loss Facial Palsy (4. )    : Normal symmetrical movements Motor Arm, Left (5a. )   +: No drift Motor Arm, Right (5b. )   +: No drift Motor Leg, Left (6a. )   +: No drift Motor Leg, Right (6b. )   +: No drift Limb Ataxia (7. ): Absent Sensory (8. )   +: Normal, no sensory loss Best Language (9. )   +: No aphasia Dysarthria (10. ): Normal Extinction/Inattention (11.)   +: No Abnormality Modified SS Total  +: 0 Complete NIHSS TOTAL: 0 Last date known well: 05/07/19 Last time known well: 1145 Neuro Assessment:   Neuro Checks:   Initial (05/07/19 1236)  Last Documented NIHSS Modified Score: 0 (05/07/19 1236) Has TPA been given? No If patient is a Neuro Trauma and patient is going to OR before floor call report to [facility] nurse: [phone] or [phone]     R Recommendations: See Admitting Provider Note  Report given to:   Additional Notes:

## 2019-05-07 NOTE — ED Notes (Signed)
Swallow eval completed. Patient passed. Awaiting ct head. C/o of headache 6/10

## 2019-05-07 NOTE — H&P (Signed)
[facility] NAME: Natalie Greene    MR#:  [id number]  DATE OF BIRTH:  1971/10/20  DATE OF ADMISSION:  05/07/2019  PRIMARY CARE PHYSICIAN: Frazier Richards, MD   REQUESTING/REFERRING PHYSICIAN: Lavonia Drafts  CHIEF COMPLAINT:   Chief Complaint  Patient presents with  . Cerebrovascular Accident    HISTORY OF PRESENT ILLNESS:  Natalie Greene  is a 48 y.o. female with a known history of patient is a 48 year old female with known history of hypertension, diabetes mellitus, hyperlipidemia and prior history of TIA who presented to the emergency room with complaints of left-sided weakness, blurry vision and difficulty with speech.  Symptoms reported to have started at about 11 AM.  Patient had a similar episode in January.  Work-up done at that time was unremarkable.  Patient already seen by neurologist in the emergency room.  CT scan of the head without contrast done with no acute findings.  At the time of my evaluation symptoms have significantly improved.  Patient denies any fevers.  No nausea or vomiting.  No abdominal pain.  Patient being admitted to medical service for further evaluation of TIA.  PAST MEDICAL HISTORY:   Past Medical History:  Diagnosis Date  . Anxiety   . Cancer ([place])   . Diabetes mellitus without complication ([place])   . GERD (gastroesophageal reflux disease)   . Hypertension   . Lupus ([place])   . Squamous cell carcinoma   . Stroke [facility])    Stroke in Jan-2020 / TIA -April 2020    PAST SURGICAL HISTORY:   Past Surgical History:  Procedure Laterality Date  . ABDOMINAL HYSTERECTOMY     approx 5 years ago from 2017   . CHOLECYSTECTOMY    . WISDOM TOOTH EXTRACTION      SOCIAL HISTORY:   Social History   Tobacco Use  . Smoking status: Never Smoker  . Smokeless tobacco: Never Used  Substance Use Topics  . Alcohol use: Yes    Alcohol/week: 0.0 standard drinks    FAMILY HISTORY:   Family History   Problem Relation Age of Onset  . Cancer Father        colon and prostate  . Diabetes Father   . Breast cancer Other     DRUG ALLERGIES:  No Known Allergies  REVIEW OF SYSTEMS:   Review of Systems  Constitutional: Negative for chills and fever.  HENT: Negative for hearing loss and tinnitus.   Eyes: Negative for blurred vision and double vision.  Respiratory: Negative for cough and shortness of breath.   Cardiovascular: Negative for chest pain and palpitations.  Gastrointestinal: Negative for heartburn and nausea.  Genitourinary: Negative for dysuria and urgency.  Musculoskeletal: Negative for myalgias and neck pain.  Skin: Negative for itching and rash.  Neurological: Negative for dizziness and headaches.       Left-sided weakness and slurred speech; significantly improved  Psychiatric/Behavioral: Negative for depression and hallucinations.    MEDICATIONS AT HOME:   Prior to Admission medications   Medication Sig Start Date End Date Taking? Authorizing Provider  ALPRAZolam (XANAX) 1 MG tablet TAKE ONE TABLET BY MOUTH AT BEDTIME AS NEEDED FOR ANXIETY Patient taking differently: Take 1 mg by mouth at bedtime as needed for sleep.  10/10/16  Yes Leone Haven, MD  Ascorbic Acid (VITAMIN C PO) Take 1 tablet by mouth daily.   Yes [provider]  atorvastatin (LIPITOR) 20 MG tablet Take 1 tablet (20  mg total) by mouth daily. 03/20/19 03/19/20 Yes Carrie Mew, MD  escitalopram (LEXAPRO) 10 MG tablet Take 10 mg by mouth daily. 05/02/19  Yes [provider]  esomeprazole (NEXIUM) 40 MG capsule Take 1 capsule (40 mg total) by mouth daily at 12 noon. 09/04/16  Yes Leone Haven, MD  furosemide (LASIX) 20 MG tablet Take 20 mg by mouth daily.    Yes [provider]  linagliptin (TRADJENTA) 5 MG TABS tablet Take 5 mg by mouth daily.   Yes [provider]  lisinopril (PRINIVIL,ZESTRIL) 5 MG tablet Take 1 tablet (5 mg total) by mouth daily. 03/20/19  03/19/20 Yes Carrie Mew, MD  meloxicam (MOBIC) 7.5 MG tablet Take 7.5 mg by mouth 2 (two) times a day.    Yes [provider]  valACYclovir (VALTREX) 500 MG tablet TAKE 1 TABLET (500 MG TOTAL) BY MOUTH 2 (TWO) TIMES DAILY. Patient taking differently: Take 500 mg by mouth 2 (two) times daily as needed (for fever blisters or as directed).  10/17/16  Yes Leone Haven, MD  VITAMIN E PO Take 1 capsule by mouth daily.   Yes [provider]  buPROPion (WELLBUTRIN XL) 150 MG 24 hr tablet Take 1 tablet (150 mg total) by mouth daily. Patient not taking: Reported on 05/07/2019 12/19/18   Kathrynn Ducking, MD  hydrochlorothiazide (HYDRODIURIL) 25 MG tablet TAKE ONE TABLET BY MOUTH DAILY. Patient not taking: Reported on 05/07/2019 04/15/17   Leone Haven, MD      VITAL SIGNS:  Blood pressure 126/65, pulse (!) 54, temperature 98.4 F (36.9 C), temperature source Oral, resp. rate 17, height 5\' 1"  (1.549 m), weight 91 kg, SpO2 100 %.  PHYSICAL EXAMINATION:  Physical Exam  GENERAL:  48 y.o.-year-old patient lying in the bed with no acute distress.  EYES: Pupils equal, round, reactive to light and accommodation. No scleral icterus. Extraocular muscles intact.  HEENT: Head atraumatic, normocephalic. Oropharynx and nasopharynx clear.  NECK:  Supple, no jugular venous distention. No thyroid enlargement, no tenderness.  LUNGS: Normal breath sounds bilaterally, no wheezing, rales,rhonchi or crepitation. No use of accessory muscles of respiration.  CARDIOVASCULAR: S1, S2 normal. No murmurs, rubs, or gallops.  ABDOMEN: Soft, nontender, nondistended. Bowel sounds present. No organomegaly or mass.  EXTREMITIES: No pedal edema, cyanosis, or clubbing.  NEUROLOGIC: Cranial nerves II through XII are intact. Muscle strength 5/5 in all extremities. Sensation intact. Gait not checked.  PSYCHIATRIC: The patient is alert and oriented x 3.  SKIN: No obvious rash, lesion, or ulcer.    LABORATORY PANEL:   CBC Recent Labs  Lab 05/07/19 1232  WBC 7.8  HGB 12.7  HCT 37.9  PLT 250   ------------------------------------------------------------------------------------------------------------------  Chemistries  Recent Labs  Lab 05/07/19 1232  NA 139  K 4.1  CL 101  CO2 27  GLUCOSE 160*  BUN 12  CREATININE 0.70  CALCIUM 8.9  AST 15  ALT 16  ALKPHOS 59  BILITOT 0.5   ------------------------------------------------------------------------------------------------------------------  Cardiac Enzymes No results for input(s): TROPONINI in the last 168 hours. ------------------------------------------------------------------------------------------------------------------  RADIOLOGY:  Ct Head Wo Contrast  Result Date: 05/07/2019 CLINICAL DATA:  Left-sided weakness EXAM: CT HEAD WITHOUT CONTRAST TECHNIQUE: Contiguous axial images were obtained from the base of the skull through the vertex without intravenous contrast. COMPARISON:  MRI head 03/20/2019 FINDINGS: Brain: No evidence of acute infarction, hemorrhage, hydrocephalus, extra-axial collection or mass lesion/mass effect. Vascular: Negative for hyperdense vessel Skull: Negative Sinuses/Orbits: Negative Other: None IMPRESSION: Negative CT head  Electronically Signed   By: Franchot Gallo M.D.   On: 05/07/2019 13:13      IMPRESSION AND PLAN:  Patient is a 48 year old female with history of hypertension, diabetes mellitus, hyperlipidemia and prior history of TIA admitted with left-sided weakness, difficulty with speech and blurry vision which is almost completely resolved at this time.  Symptoms started about 11 AM this morning.   1.  Transient ischemic attack. Patient presented with left-sided weakness, slurred speech and blurry vision.  Symptoms almost completely resolved. CT scan of the head without contrast negative. Requested for MRI, MRA of the brain, 2D echocardiogram and carotid Doppler ultrasound.  Placed on aspirin 325 mg p.o. daily.  Increased dose of statin from 20 mg to 40 mg p.o. daily. Lipid panel, glycosylated hemoglobin level in a.m.  2.  Hypertension Blood pressure controlled with recent systolic blood pressure in the 120s.  Holding off on blood pressure medications for the past 24 hours to allow for permissive hypertension.  3.  Diabetes mellitus type 2 Resume home regimen.  Placed on sliding scale insulin coverage.  Glycosylated hemoglobin level in a.m.  4.  Hyperlipidemia Continue statins.  Lipid panel in a.m.  DVT prophylaxis; Lovenox  All the records are reviewed and case discussed with ED provider. Management plans discussed with the patient, family and they are in agreement.  CODE STATUS: Full code  TOTAL TIME TAKING CARE OF THIS PATIENT: 52 minutes.    Hava Massingale M.D on 05/07/2019 at 3:38 PM  Between 7am to 6pm - Pager - [phone]  After 6pm go to [email or web address] - [employer] [facility]  Office  [phone]  CC: Primary care physician; Frazier Richards, MD   Note: This dictation was prepared with Dragon dictation along with smaller phrase technology. Any transcriptional errors that result from this process are unintentional.

## 2019-05-07 NOTE — ED Notes (Signed)
Patient swabbed for covid. Awaiting admission.

## 2019-05-07 NOTE — ED Triage Notes (Signed)
Reports feeling stroke like symtoms that began 1 hour ago, reports stroke in jan 2020 and TIA last month. Patient with clear speech upon arrival, reports symptoms resolving upon ED arrival. MD at bedside.

## 2019-05-07 NOTE — ED Notes (Signed)
Awaiting scheduled meds to be verified by pharmacy prior to administrating.

## 2019-05-08 ENCOUNTER — Observation Stay: Payer: BLUE CROSS/BLUE SHIELD

## 2019-05-08 ENCOUNTER — Observation Stay (HOSPITAL_BASED_OUTPATIENT_CLINIC_OR_DEPARTMENT_OTHER)
Admit: 2019-05-08 | Discharge: 2019-05-08 | Disposition: A | Discharge status: Home / Self Care | Payer: BLUE CROSS/BLUE SHIELD | Attending: Internal Medicine | Admitting: Internal Medicine

## 2019-05-08 DIAGNOSIS — I361 Nonrheumatic tricuspid (valve) insufficiency: Secondary | ICD-10-CM

## 2019-05-08 DIAGNOSIS — G459 Transient cerebral ischemic attack, unspecified: Secondary | ICD-10-CM

## 2019-05-08 LAB — BASIC METABOLIC PANEL
Anion gap: 8 (ref 5–15)
BUN: 14 mg/dL (ref 6–20)
CO2: 27 mmol/L (ref 22–32)
Calcium: 8.8 mg/dL — ABNORMAL LOW (ref 8.9–10.3)
Chloride: 104 mmol/L (ref 98–111)
Creatinine, Ser: 0.71 mg/dL (ref 0.44–1.00)
GFR calc Af Amer: >60 mL/min (ref >60)
GFR calc non Af Amer: >60 mL/min (ref >60)
Glucose, Bld: 139 mg/dL — ABNORMAL HIGH (ref 70–99)
Potassium: 3.8 mmol/L (ref 3.5–5.1)
Sodium: 139 mmol/L (ref 135–145)

## 2019-05-08 LAB — CBC
HCT: 36.1 % (ref 36.0–46.0)
Hemoglobin: 12 g/dL (ref 12.0–15.0)
MCH: 28.8 pg (ref 26.0–34.0)
MCHC: 33.2 g/dL (ref 30.0–36.0)
MCV: 86.6 fL (ref 80.0–100.0)
Platelets: 228 10*3/uL (ref 150–400)
RBC: 4.17 MIL/uL (ref 3.87–5.11)
RDW: 12.9 % (ref 11.5–15.5)
WBC: 6.6 10*3/uL (ref 4.0–10.5)
nRBC: 0 % (ref 0.0–0.2)

## 2019-05-08 LAB — LIPID PANEL
Cholesterol: 172 mg/dL (ref 0–200)
HDL: 47 mg/dL (ref >40)
LDL Cholesterol: 95 mg/dL (ref 0–99)
Total CHOL/HDL Ratio: 3.7 RATIO
Triglycerides: 152 mg/dL — ABNORMAL HIGH (ref <150)
VLDL: 30 mg/dL (ref 0–40)

## 2019-05-08 LAB — HEMOGLOBIN A1C
Hgb A1c MFr Bld: 6.9 % — ABNORMAL HIGH (ref 4.8–5.6)
Mean Plasma Glucose: 151.33 mg/dL

## 2019-05-08 LAB — GLUCOSE, CAPILLARY
Glucose-Capillary: 124 mg/dL — ABNORMAL HIGH (ref 70–99)
Glucose-Capillary: 175 mg/dL — ABNORMAL HIGH (ref 70–99)

## 2019-05-08 LAB — ECHOCARDIOGRAM COMPLETE
Height: 61 in
Weight: 3440 oz

## 2019-05-08 LAB — MAGNESIUM: Magnesium: 1.9 mg/dL (ref 1.7–2.4)

## 2019-05-08 MED ORDER — ENOXAPARIN SODIUM 40 MG/0.4ML ~~LOC~~ SOLN: 40.0000 mg | Freq: Two times a day (BID) | SUBCUTANEOUS | Status: DC

## 2019-05-08 MED ORDER — ASPIRIN 325 MG PO TABS: 325.0000 mg | ORAL_TABLET | Freq: Every day | ORAL | 1 refills | Status: DC

## 2019-05-08 NOTE — Progress Notes (Signed)
Anticoagulation monitoring(Lovenox):  47yo  F ordered Lovenox 40 mg Q24h  Filed Weights   05/07/19 1231 05/07/19 1749 05/08/19 0405  Weight: 200 lb 11 oz (91 kg) 214 lb 9.6 oz (97.3 kg) 215 lb (97.5 kg)   BMI 40.6    Lab Results  Component Value Date   CREATININE 0.71 05/08/2019   CREATININE 0.70 05/07/2019   CREATININE 0.91 03/20/2019   Estimated Creatinine Clearance: 92.9 mL/min (by C-G formula based on SCr of 0.71 mg/dL). Hemoglobin & Hematocrit     Component Value Date/Time   HGB 12.0 05/08/2019 0523   HCT 36.1 05/08/2019 0523     Per Protocol for Patient with estCrcl > 30 ml/min and BMI > 40, will transition to Lovenox 40 mg Q12h.     Chinita Greenland PharmD Clinical Pharmacist 05/08/2019

## 2019-05-08 NOTE — Progress Notes (Signed)
Subjective: Patient reports being improved this morning but still feeling a little heaviness in her left arm.    Objective: Current vital signs: BP 127/70 (BP Location: Right Arm)   Pulse (!) 53   Temp 98.1 F (36.7 C) (Oral)   Resp 20   Ht 5\' 1"  (1.549 m)   Wt 97.5 kg   SpO2 97%   BMI 40.62 kg/m  Vital signs in last 24 hours: Temp:  [97.8 F (36.6 C)-98.1 F (36.7 C)] 98.1 F (36.7 C) (05/22 0721) Pulse Rate:  [50-66] 53 (05/22 0721) Resp:  [7-20] 20 (05/22 0721) BP: (109-156)/(65-91) 127/70 (05/22 0721) SpO2:  [95 %-100 %] 97 % (05/22 0721) Weight:  [97.3 kg-97.5 kg] 97.5 kg (05/22 0405)  Intake/Output from previous day: 05/21 0701 - 05/22 0700 In: -  Out: 100 [Urine:100] Intake/Output this shift: Total I/O In: 240 [P.O.:240] Out: -  Nutritional status:  Diet Order            Diet Carb Modified Fluid consistency: Thin; Room service appropriate? Yes  Diet effective now              Neurologic Exam: Mental Status: Alert, oriented, thought content appropriate.  Speech fluent without evidence of aphasia.  Able to follow 3 step commands without difficulty. Cranial Nerves: II: Discs flat bilaterally; Visual fields grossly normal, pupils equal, round, reactive to light and accommodation III,IV, VI: ptosis not present, extra-ocular motions intact bilaterally V,VII: smile symmetric, facial light touch sensation normal bilaterally VIII: hearing normal bilaterally IX,X: gag reflex present XI: bilateral shoulder shrug XII: midline tongue extension Motor: Right : Upper extremity   5/5    Left:     Upper extremity   5/5 drift without pronation  Lower extremity   5/5     Lower extremity   5/5 Tone and bulk:normal tone throughout; no atrophy noted Sensory: Pinprick and light touch intact throughout, bilaterally  Lab Results: Basic Metabolic Panel: Recent Labs  Lab 05/07/19 1232 05/08/19 0523  NA 139 139  K 4.1 3.8  CL 101 104  CO2 27 27  GLUCOSE 160* 139*   BUN 12 14  CREATININE 0.70 0.71  CALCIUM 8.9 8.8*  MG  --  1.9    Liver Function Tests: Recent Labs  Lab 05/07/19 1232  AST 15  ALT 16  ALKPHOS 59  BILITOT 0.5  PROT 7.9  ALBUMIN 4.1   No results for input(s): LIPASE, AMYLASE in the last 168 hours. No results for input(s): AMMONIA in the last 168 hours.  CBC: Recent Labs  Lab 05/07/19 1232 05/08/19 0523  WBC 7.8 6.6  NEUTROABS 5.2  --   HGB 12.7 12.0  HCT 37.9 36.1  MCV 86.5 86.6  PLT 250 228    Cardiac Enzymes: Recent Labs  Lab 05/07/19 1743  TROPONINI <0.03    Lipid Panel: Recent Labs  Lab 05/08/19 0523  CHOL 172  TRIG 152*  HDL 47  CHOLHDL 3.7  VLDL 30  LDLCALC 95    CBG: Recent Labs  Lab 05/07/19 1226 05/07/19 1800 05/07/19 2052 05/08/19 0724 05/08/19 1115  GLUCAP 147* 102* 203* 124* 175*    Microbiology: Results for orders placed or performed during the hospital encounter of 05/07/19  SARS Coronavirus 2 (CEPHEID - Performed in [place] hospital lab), Hosp Order     Status: None   Collection Time: 05/07/19  2:32 PM  Result Value Ref Range Status   SARS Coronavirus 2 NEGATIVE NEGATIVE Final    Comment: (  NOTE) If result is NEGATIVE SARS-CoV-2 target nucleic acids are NOT DETECTED. The SARS-CoV-2 RNA is generally detectable in upper and lower  respiratory specimens during the acute phase of infection. The lowest  concentration of SARS-CoV-2 viral copies this assay can detect is 250  copies / mL. A negative result does not preclude SARS-CoV-2 infection  and should not be used as the sole basis for treatment or other  patient management decisions.  A negative result may occur with  improper specimen collection / handling, submission of specimen other  than nasopharyngeal swab, presence of viral mutation(s) within the  areas targeted by this assay, and inadequate number of viral copies  (<250 copies / mL). A negative result must be combined with clinical  observations, patient  history, and epidemiological information. If result is POSITIVE SARS-CoV-2 target nucleic acids are DETECTED. The SARS-CoV-2 RNA is generally detectable in upper and lower  respiratory specimens dur ing the acute phase of infection.  Positive  results are indicative of active infection with SARS-CoV-2.  Clinical  correlation with patient history and other diagnostic information is  necessary to determine patient infection status.  Positive results do  not rule out bacterial infection or co-infection with other viruses. If result is PRESUMPTIVE POSTIVE SARS-CoV-2 nucleic acids MAY BE PRESENT.   A presumptive positive result was obtained on the submitted specimen  and confirmed on repeat testing.  While 2019 novel coronavirus  (SARS-CoV-2) nucleic acids may be present in the submitted sample  additional confirmatory testing may be necessary for epidemiological  and / or clinical management purposes  to differentiate between  SARS-CoV-2 and other Sarbecovirus currently known to infect humans.  If clinically indicated additional testing with an alternate test  methodology [id number]) is advised. The SARS-CoV-2 RNA is generally  detectable in upper and lower respiratory sp ecimens during the acute  phase of infection. The expected result is Negative. Fact Sheet for Patients:  StrictlyIdeas.no Fact Sheet for Healthcare Providers: BankingDealers.co.za This test is not yet approved or cleared by the [place] FDA and has been authorized for detection and/or diagnosis of SARS-CoV-2 by FDA under an Emergency Use Authorization (EUA).  This EUA will remain in effect (meaning this test can be used) for the duration of the COVID-19 declaration under Section 564(b)(1) of the Act, 21 U.S.C. section 360bbb-3(b)(1), unless the authorization is terminated or revoked sooner. Performed at [facility], [address] [place], [place]  [id number]     Coagulation Studies: Recent Labs    05/07/19 1232  LABPROT 12.0  INR 0.9    Imaging: Ct Head Wo Contrast  Result Date: 05/07/2019 CLINICAL DATA:  Left-sided weakness EXAM: CT HEAD WITHOUT CONTRAST TECHNIQUE: Contiguous axial images were obtained from the base of the skull through the vertex without intravenous contrast. COMPARISON:  MRI head 03/20/2019 FINDINGS: Brain: No evidence of acute infarction, hemorrhage, hydrocephalus, extra-axial collection or mass lesion/mass effect. Vascular: Negative for hyperdense vessel Skull: Negative Sinuses/Orbits: Negative Other: None IMPRESSION: Negative CT head Electronically Signed   By: Franchot Gallo M.D.   On: 05/07/2019 13:13   Mr Brain Wo Contrast  Result Date: 05/07/2019 CLINICAL DATA:  48 y/o F; episode of left-sided weakness, blurry vision, speech difficulty. TIA, initial exam. EXAM: MRI HEAD WITHOUT CONTRAST MRA HEAD WITHOUT CONTRAST TECHNIQUE: Multiplanar, multiecho pulse sequences of the brain and surrounding structures were obtained without intravenous contrast. Angiographic images of the head were obtained using MRA technique without contrast. COMPARISON:  05/07/2019 CT of the head.  03/20/2019 MRI of the head. FINDINGS: MRI HEAD FINDINGS Brain: No acute infarction, hemorrhage, hydrocephalus, extra-axial collection or mass lesion. Single stable punctate focus of chronic microhemorrhage within the left external capsule. Stable very small chronic infarction within right putamen. Stable scattered punctate foci of nonspecific T2 FLAIR hyperintense signal abnormality in predominantly bifrontal subcortical white matter. Vascular: As below. Skull and upper cervical spine: Normal marrow signal. Sinuses/Orbits: Negative. Other: None. MRA HEAD FINDINGS Internal carotid arteries:  Patent. Anterior cerebral arteries:  Patent. Middle cerebral arteries: Patent. Anterior communicating artery: Patent. Posterior communicating arteries:  Patent.  Posterior cerebral arteries:  Patent. Basilar artery:  Patent. Vertebral arteries: Patent. 2 mm posteriorly directed outpouching of right vertebral artery (series 13, image 9) possibly representing a infundibular origin of a diminutive vessel or tiny aneurysm. No evidence of high-grade stenosis, large vessel occlusion, or additional aneurysm. IMPRESSION: MRI head: No acute intracranial abnormality. Stable small chronic lacunar infarction within the right putamen. Stable MRI of the head. MRA head: 1. Patent anterior and posterior intracranial circulation. No significant stenosis or large vessel occlusion. 2. 2 mm posteriorly directed outpouching of right vertebral artery possibly representing a infundibular origin of a diminutive vessel or tiny aneurysm. The structure is at the lower end of the field of view. Electronically Signed   By: Kristine Garbe M.D.   On: 05/07/2019 19:48   Mr Jodene Nam Head/brain ZC Cm  Result Date: 05/07/2019 CLINICAL DATA:  48 y/o F; episode of left-sided weakness, blurry vision, speech difficulty. TIA, initial exam. EXAM: MRI HEAD WITHOUT CONTRAST MRA HEAD WITHOUT CONTRAST TECHNIQUE: Multiplanar, multiecho pulse sequences of the brain and surrounding structures were obtained without intravenous contrast. Angiographic images of the head were obtained using MRA technique without contrast. COMPARISON:  05/07/2019 CT of the head.  03/20/2019 MRI of the head. FINDINGS: MRI HEAD FINDINGS Brain: No acute infarction, hemorrhage, hydrocephalus, extra-axial collection or mass lesion. Single stable punctate focus of chronic microhemorrhage within the left external capsule. Stable very small chronic infarction within right putamen. Stable scattered punctate foci of nonspecific T2 FLAIR hyperintense signal abnormality in predominantly bifrontal subcortical white matter. Vascular: As below. Skull and upper cervical spine: Normal marrow signal. Sinuses/Orbits: Negative. Other: None. MRA HEAD  FINDINGS Internal carotid arteries:  Patent. Anterior cerebral arteries:  Patent. Middle cerebral arteries: Patent. Anterior communicating artery: Patent. Posterior communicating arteries:  Patent. Posterior cerebral arteries:  Patent. Basilar artery:  Patent. Vertebral arteries: Patent. 2 mm posteriorly directed outpouching of right vertebral artery (series 13, image 9) possibly representing a infundibular origin of a diminutive vessel or tiny aneurysm. No evidence of high-grade stenosis, large vessel occlusion, or additional aneurysm. IMPRESSION: MRI head: No acute intracranial abnormality. Stable small chronic lacunar infarction within the right putamen. Stable MRI of the head. MRA head: 1. Patent anterior and posterior intracranial circulation. No significant stenosis or large vessel occlusion. 2. 2 mm posteriorly directed outpouching of right vertebral artery possibly representing a infundibular origin of a diminutive vessel or tiny aneurysm. The structure is at the lower end of the field of view. Electronically Signed   By: Kristine Garbe M.D.   On: 05/07/2019 19:48    Medications:  I have reviewed the patient's current medications. Scheduled: .  stroke: mapping our early stages of recovery book   Does not apply Once  . aspirin  325 mg Oral Daily  . atorvastatin  40 mg Oral q1800  . enoxaparin (LOVENOX) injection  40 mg Subcutaneous Q12H  . escitalopram  10 mg Oral Daily  .  furosemide  20 mg Oral Daily  . insulin aspart  0-5 Units Subcutaneous QHS  . insulin aspart  0-9 Units Subcutaneous TID WC  . linagliptin  5 mg Oral Daily  . meloxicam  7.5 mg Oral BID  . pantoprazole  40 mg Oral Daily  . vitamin C  500 mg Oral Daily    Assessment/Plan: Patient near baseline.  No significant weakness noted.  MRI of the brain reviewed and shows no acute changes.  Echocardiogram and EEG are pending.  A1c 6.9.  LDL 95.  With these episodes being so repetitive and stereotypical, am concerned that  these are not TIA.  Other etiologies being investigated such as complicated migraine and seizure.    Recommendations: 1. If Echo and EEG are unremarkable, patient to continue outpatient follow up with Dr. Melrose Nakayama 2. Continue ASA daily.     LOS: 0 days   Alexis Goodell, MD Neurology [phone] 05/08/2019  12:53 PM

## 2019-05-08 NOTE — Procedures (Signed)
ELECTROENCEPHALOGRAM REPORT   Patient: Natalie Greene       Room #: 373S-KA EEG No. ID: [id number] Age: 48 y.o.        Sex: female Referring Physician: Posey Pronto Report Date:  05/08/2019        Interpreting Physician: Alexis Goodell  History: ED RAYSON is an 48 y.o. female with recurrent episodes of left sided weakness  Medications:  ASA, Insulin, Lexapro, Lasix, Tradjenta, Mobic, Vitamin C, Protonix  Conditions of Recording:  This is a 21 channel routine scalp EEG performed with bipolar and monopolar montages arranged in accordance to the international 10/20 system of electrode placement. One channel was dedicated to EKG recording.  The patient is in the awake, drowsy and asleep states.  Description:  The waking background activity is noted briefly during the recording and consists of a low voltage, symmetrical, fairly well organized, 9 Hz alpha activity, seen from the parieto-occipital and posterior temporal regions.  Low voltage fast activity, poorly organized, is seen anteriorly and is at times superimposed on more posterior regions.  A mixture of theta and alpha rhythms are seen from the central and temporal regions. The patient drowses with slowing to irregular, low voltage theta and beta activity.   The patient is in a light sleep for the majority of the recording with symmetrical sleep spindles, vertex central sharp transients and irregular slow activity noted. No epileptiform activity is noted.   Hyperventilation was not performed.   Intermittent photic stimulation was performed but failed to illicit any change in the tracing.   IMPRESSION: Normal electroencephalogram, awake, asleep and with activation procedures. There are no focal lateralizing or epileptiform features.   Alexis Goodell, MD Neurology [phone] 05/08/2019, 1:19 PM

## 2019-05-08 NOTE — Discharge Summary (Signed)
[facility] NAME: Natalie Greene    MR#:  [id number]  DATE OF BIRTH:  1971/06/18  DATE OF ADMISSION:  05/07/2019 ADMITTING PHYSICIAN: Otila Back, MD  DATE OF DISCHARGE: 05/08/2019  PRIMARY CARE PHYSICIAN: Frazier Richards, MD    ADMISSION DIAGNOSIS:  TIA (transient ischemic attack) [G45.9]  DISCHARGE DIAGNOSIS:  TIA  SECONDARY DIAGNOSIS:   Past Medical History:  Diagnosis Date  . Anxiety   . Cancer ([place])   . Diabetes mellitus without complication ([place])   . GERD (gastroesophageal reflux disease)   . Hypertension   . Lupus ([place])   . Squamous cell carcinoma   . Stroke [facility])    Stroke in Jan-2020 / TIA -April 2020    HOSPITAL COURSE:  Patient is a 48 year old female with history of hypertension, diabetes mellitus, hyperlipidemia and prior history of TIA admitted with left-sided weakness, difficulty with speech and blurry vision which is almost completely resolved at this time.  Symptoms started about 11 AM this morning.  1.  Transient ischemic attack. Patient presented with left-sided weakness, slurred speech and blurry vision.  Symptoms almost completely resolved. -CT scan of the head without contrast negative. -MRI, MRA of the brain negative - 2D echocardiogramThe left ventricle has normal systolic function, with an ejection fraction of 60-65%. The cavity size was normal. There is no increase in left ventricular wall thickness. Left ventricular diastolic parameters were normal. - carotid Doppler ultrasound neg for stenosis -Placed on aspirin 325 mg p.o. daily.cont statins  -No PT/OT needs -EEG negative  2.  Hypertension Blood pressure controlled with recent systolic blood pressure in the 120s.  Holding off on blood pressure medications for the past 24 hours to allow for permissive hypertension.  3.  Diabetes mellitus type 2 Resume home regimen.  Placed on sliding scale insulin coverage.  Glycosylated hemoglobin  level in a.m.  4.  Hyperlipidemia Continue statins.    5.DVT prophylaxis; Lovenox  Doing well. D/c home    CONSULTS OBTAINED:  Treatment Team:  Catarina Hartshorn, MD Alexis Goodell, MD  DRUG ALLERGIES:  No Known Allergies  DISCHARGE MEDICATIONS:   Allergies as of 05/08/2019   No Known Allergies     Medication List    STOP taking these medications   buPROPion 150 MG 24 hr tablet Commonly known as:  Wellbutrin XL   hydrochlorothiazide 25 MG tablet Commonly known as:  HYDRODIURIL     TAKE these medications   ALPRAZolam 1 MG tablet Commonly known as:  XANAX TAKE ONE TABLET BY MOUTH AT BEDTIME AS NEEDED FOR ANXIETY What changed:  See the new instructions.   aspirin 325 MG tablet Take 1 tablet (325 mg total) by mouth daily.   atorvastatin 20 MG tablet Commonly known as:  Lipitor Take 1 tablet (20 mg total) by mouth daily.   escitalopram 10 MG tablet Commonly known as:  LEXAPRO Take 10 mg by mouth daily.   esomeprazole 40 MG capsule Commonly known as:  NEXIUM Take 1 capsule (40 mg total) by mouth daily at 12 noon.   furosemide 20 MG tablet Commonly known as:  LASIX Take 20 mg by mouth daily.   lisinopril 5 MG tablet Commonly known as:  ZESTRIL Take 1 tablet (5 mg total) by mouth daily.   meloxicam 7.5 MG tablet Commonly known as:  MOBIC Take 7.5 mg by mouth 2 (two) times a day.   Tradjenta 5 MG Tabs tablet Generic drug:  linagliptin Take 5  mg by mouth daily.   valACYclovir 500 MG tablet Commonly known as:  VALTREX TAKE 1 TABLET (500 MG TOTAL) BY MOUTH 2 (TWO) TIMES DAILY. What changed:    how much to take  how to take this  when to take this  reasons to take this  additional instructions   VITAMIN C PO Take 1 tablet by mouth daily.   VITAMIN E PO Take 1 capsule by mouth daily.       If you experience worsening of your admission symptoms, develop shortness of breath, life threatening emergency, suicidal or homicidal thoughts  you must seek medical attention immediately by calling 911 or calling your MD immediately  if symptoms less severe.  You Must read complete instructions/literature along with all the possible adverse reactions/side effects for all the Medicines you take and that have been prescribed to you. Take any new Medicines after you have completely understood and accept all the possible adverse reactions/side effects.   Please note  You were cared for by a hospitalist during your hospital stay. If you have any questions about your discharge medications or the care you received while you were in the hospital after you are discharged, you can call the unit and asked to speak with the hospitalist on call if the hospitalist that took care of you is not available. Once you are discharged, your primary care physician will handle any further medical issues. Please note that NO REFILLS for any discharge medications will be authorized once you are discharged, as it is imperative that you return to your primary care physician (or establish a relationship with a primary care physician if you do not have one) for your aftercare needs so that they can reassess your need for medications and monitor your lab values. Today   SUBJECTIVE  Doing well   VITAL SIGNS:  Blood pressure 124/69, pulse 66, temperature 98.2 F (36.8 C), temperature source Oral, resp. rate 20, height 5\' 1"  (1.549 m), weight 97.5 kg, SpO2 92 %.  I/O:    Intake/Output Summary (Last 24 hours) at 05/08/2019 1339 Last data filed at 05/08/2019 0900 Gross per 24 hour  Intake 240 ml  Output 100 ml  Net 140 ml    PHYSICAL EXAMINATION:  GENERAL:  48 y.o.-year-old patient lying in the bed with no acute distress. obese EYES: Pupils equal, round, reactive to light and accommodation. No scleral icterus. Extraocular muscles intact.  HEENT: Head atraumatic, normocephalic. Oropharynx and nasopharynx clear.  NECK:  Supple, no jugular venous distention. No  thyroid enlargement, no tenderness.  LUNGS: Normal breath sounds bilaterally, no wheezing, rales,rhonchi or crepitation. No use of accessory muscles of respiration.  CARDIOVASCULAR: S1, S2 normal. No murmurs, rubs, or gallops.  ABDOMEN: Soft, non-tender, non-distended. Bowel sounds present. No organomegaly or mass.  EXTREMITIES: No pedal edema, cyanosis, or clubbing.  NEUROLOGIC: Cranial nerves II through XII are intact. Muscle strength 5/5 in all extremities. Sensation intact. Gait not checked.  PSYCHIATRIC: The patient is alert and oriented x 3.  SKIN: No obvious rash, lesion, or ulcer.   DATA REVIEW:   CBC  Recent Labs  Lab 05/08/19 0523  WBC 6.6  HGB 12.0  HCT 36.1  PLT 228    Chemistries  Recent Labs  Lab 05/07/19 1232 05/08/19 0523  NA 139 139  K 4.1 3.8  CL 101 104  CO2 27 27  GLUCOSE 160* 139*  BUN 12 14  CREATININE 0.70 0.71  CALCIUM 8.9 8.8*  MG  --  1.9  AST 15  --   ALT 16  --   ALKPHOS 59  --   BILITOT 0.5  --     Microbiology Results   Recent Results (from the past 240 hour(s))  SARS Coronavirus 2 (CEPHEID - Performed in [place] hospital lab), Hosp Order     Status: None   Collection Time: 05/07/19  2:32 PM  Result Value Ref Range Status   SARS Coronavirus 2 NEGATIVE NEGATIVE Final    Comment: (NOTE) If result is NEGATIVE SARS-CoV-2 target nucleic acids are NOT DETECTED. The SARS-CoV-2 RNA is generally detectable in upper and lower  respiratory specimens during the acute phase of infection. The lowest  concentration of SARS-CoV-2 viral copies this assay can detect is 250  copies / mL. A negative result does not preclude SARS-CoV-2 infection  and should not be used as the sole basis for treatment or other  patient management decisions.  A negative result may occur with  improper specimen collection / handling, submission of specimen other  than nasopharyngeal swab, presence of viral mutation(s) within the  areas targeted by this assay, and  inadequate number of viral copies  (<250 copies / mL). A negative result must be combined with clinical  observations, patient history, and epidemiological information. If result is POSITIVE SARS-CoV-2 target nucleic acids are DETECTED. The SARS-CoV-2 RNA is generally detectable in upper and lower  respiratory specimens dur ing the acute phase of infection.  Positive  results are indicative of active infection with SARS-CoV-2.  Clinical  correlation with patient history and other diagnostic information is  necessary to determine patient infection status.  Positive results do  not rule out bacterial infection or co-infection with other viruses. If result is PRESUMPTIVE POSTIVE SARS-CoV-2 nucleic acids MAY BE PRESENT.   A presumptive positive result was obtained on the submitted specimen  and confirmed on repeat testing.  While 2019 novel coronavirus  (SARS-CoV-2) nucleic acids may be present in the submitted sample  additional confirmatory testing may be necessary for epidemiological  and / or clinical management purposes  to differentiate between  SARS-CoV-2 and other Sarbecovirus currently known to infect humans.  If clinically indicated additional testing with an alternate test  methodology [id number]) is advised. The SARS-CoV-2 RNA is generally  detectable in upper and lower respiratory sp ecimens during the acute  phase of infection. The expected result is Negative. Fact Sheet for Patients:  StrictlyIdeas.no Fact Sheet for Healthcare Providers: BankingDealers.co.za This test is not yet approved or cleared by the [place] FDA and has been authorized for detection and/or diagnosis of SARS-CoV-2 by FDA under an Emergency Use Authorization (EUA).  This EUA will remain in effect (meaning this test can be used) for the duration of the COVID-19 declaration under Section 564(b)(1) of the Act, 21 U.S.C. section 360bbb-3(b)(1), unless the  authorization is terminated or revoked sooner. Performed at [facility], [address] [place],  [address]     RADIOLOGY:  Ct Head Wo Contrast  Result Date: 05/07/2019 CLINICAL DATA:  Left-sided weakness EXAM: CT HEAD WITHOUT CONTRAST TECHNIQUE: Contiguous axial images were obtained from the base of the skull through the vertex without intravenous contrast. COMPARISON:  MRI head 03/20/2019 FINDINGS: Brain: No evidence of acute infarction, hemorrhage, hydrocephalus, extra-axial collection or mass lesion/mass effect. Vascular: Negative for hyperdense vessel Skull: Negative Sinuses/Orbits: Negative Other: None IMPRESSION: Negative CT head Electronically Signed   By: Franchot Gallo M.D.   On: 05/07/2019 13:13   Mr Brain Wo Contrast  Result Date: 05/07/2019 CLINICAL DATA:  48 y/o F; episode of left-sided weakness, blurry vision, speech difficulty. TIA, initial exam. EXAM: MRI HEAD WITHOUT CONTRAST MRA HEAD WITHOUT CONTRAST TECHNIQUE: Multiplanar, multiecho pulse sequences of the brain and surrounding structures were obtained without intravenous contrast. Angiographic images of the head were obtained using MRA technique without contrast. COMPARISON:  05/07/2019 CT of the head.  03/20/2019 MRI of the head. FINDINGS: MRI HEAD FINDINGS Brain: No acute infarction, hemorrhage, hydrocephalus, extra-axial collection or mass lesion. Single stable punctate focus of chronic microhemorrhage within the left external capsule. Stable very small chronic infarction within right putamen. Stable scattered punctate foci of nonspecific T2 FLAIR hyperintense signal abnormality in predominantly bifrontal subcortical white matter. Vascular: As below. Skull and upper cervical spine: Normal marrow signal. Sinuses/Orbits: Negative. Other: None. MRA HEAD FINDINGS Internal carotid arteries:  Patent. Anterior cerebral arteries:  Patent. Middle cerebral arteries: Patent. Anterior communicating artery: Patent.  Posterior communicating arteries:  Patent. Posterior cerebral arteries:  Patent. Basilar artery:  Patent. Vertebral arteries: Patent. 2 mm posteriorly directed outpouching of right vertebral artery (series 13, image 9) possibly representing a infundibular origin of a diminutive vessel or tiny aneurysm. No evidence of high-grade stenosis, large vessel occlusion, or additional aneurysm. IMPRESSION: MRI head: No acute intracranial abnormality. Stable small chronic lacunar infarction within the right putamen. Stable MRI of the head. MRA head: 1. Patent anterior and posterior intracranial circulation. No significant stenosis or large vessel occlusion. 2. 2 mm posteriorly directed outpouching of right vertebral artery possibly representing a infundibular origin of a diminutive vessel or tiny aneurysm. The structure is at the lower end of the field of view. Electronically Signed   By: Kristine Garbe M.D.   On: 05/07/2019 19:48   Mr Jodene Nam Head/brain AY Cm  Result Date: 05/07/2019 CLINICAL DATA:  48 y/o F; episode of left-sided weakness, blurry vision, speech difficulty. TIA, initial exam. EXAM: MRI HEAD WITHOUT CONTRAST MRA HEAD WITHOUT CONTRAST TECHNIQUE: Multiplanar, multiecho pulse sequences of the brain and surrounding structures were obtained without intravenous contrast. Angiographic images of the head were obtained using MRA technique without contrast. COMPARISON:  05/07/2019 CT of the head.  03/20/2019 MRI of the head. FINDINGS: MRI HEAD FINDINGS Brain: No acute infarction, hemorrhage, hydrocephalus, extra-axial collection or mass lesion. Single stable punctate focus of chronic microhemorrhage within the left external capsule. Stable very small chronic infarction within right putamen. Stable scattered punctate foci of nonspecific T2 FLAIR hyperintense signal abnormality in predominantly bifrontal subcortical white matter. Vascular: As below. Skull and upper cervical spine: Normal marrow signal.  Sinuses/Orbits: Negative. Other: None. MRA HEAD FINDINGS Internal carotid arteries:  Patent. Anterior cerebral arteries:  Patent. Middle cerebral arteries: Patent. Anterior communicating artery: Patent. Posterior communicating arteries:  Patent. Posterior cerebral arteries:  Patent. Basilar artery:  Patent. Vertebral arteries: Patent. 2 mm posteriorly directed outpouching of right vertebral artery (series 13, image 9) possibly representing a infundibular origin of a diminutive vessel or tiny aneurysm. No evidence of high-grade stenosis, large vessel occlusion, or additional aneurysm. IMPRESSION: MRI head: No acute intracranial abnormality. Stable small chronic lacunar infarction within the right putamen. Stable MRI of the head. MRA head: 1. Patent anterior and posterior intracranial circulation. No significant stenosis or large vessel occlusion. 2. 2 mm posteriorly directed outpouching of right vertebral artery possibly representing a infundibular origin of a diminutive vessel or tiny aneurysm. The structure is at the lower end of the field of view. Electronically Signed   By: Kristine Garbe M.D.   On:  05/07/2019 19:48     CODE STATUS:     Code Status Orders  (From admission, onward)         Start     Ordered   05/07/19 1527  Full code  Continuous     05/07/19 1530        Code Status History    This patient has a current code status but no historical code status.      TOTAL TIME TAKING CARE OF THIS PATIENT: *40* minutes.    Fritzi Mandes M.D on 05/08/2019 at 1:39 PM  Between 7am to 6pm - Pager - [phone] After 6pm go to [email or web address] - password [email or web address] [facility]  Office  [phone]  CC: Primary care physician; Frazier Richards, MD

## 2019-05-08 NOTE — Progress Notes (Signed)
Nutrition Brief Note  Patient identified on the Malnutrition Screening Tool (MST) Report  48 year old female with known history of hypertension, diabetes mellitus, hyperlipidemia and prior history of TIA who presented to the emergency room with complaints of left-sided weakness, blurry vision and difficulty with speech. Stroke work up negative.  Wt Readings from Last 15 Encounters:  05/08/19 97.5 kg  03/20/19 98 kg  12/19/18 100.2 kg  12/17/18 103.7 kg  12/12/18 90.7 kg  06/25/17 90.7 kg  12/26/16 99.9 kg  09/26/16 90.7 kg  07/04/16 92.2 kg  04/11/16 90.9 kg  03/07/16 68 kg  03/07/16 68 kg  03/05/16 91.3 kg  02/14/16 90.5 kg  08/23/15 93.5 kg    Body mass index is 40.62 kg/m. Patient meets criteria for morbid obeisty based on current BMI.   Current diet order is CHO modified, patient is consuming approximately 90% of meals at this time. Labs and medications reviewed.   No nutrition interventions warranted at this time. If nutrition issues arise, please consult RD.   Koleen Distance MS, RD, LDN Pager #- [phone] Office#- [phone] After Hours Pager: [phone]

## 2019-05-08 NOTE — Progress Notes (Signed)
Patient discharged home with husband. Discharge instructions reviewed, reported no questions. Belongings returned to patient from safe. Lupita Leash

## 2019-05-08 NOTE — Progress Notes (Signed)
EEG completed, results pending. 

## 2019-05-08 NOTE — Progress Notes (Signed)
*  PRELIMINARY RESULTS* Echocardiogram 2D Echocardiogram has been performed.  Sherrie Sport 05/08/2019, 8:57 AM

## 2019-05-08 NOTE — Evaluation (Signed)
Occupational Therapy Evaluation Patient Details Name: Natalie Greene MRN: [id number] DOB: 01-01-1971 Today's Date: 05/08/2019    History of Present Illness 48 y.o. female with a known history of patient is a 48 year old female with known history of hypertension, diabetes mellitus, hyperlipidemia and prior history of TIA who presented to the emergency room with complaints of left-sided weakness, blurry vision and difficulty with speech. Stroke work up negative.   Clinical Impression   Pt seen for OT evaluation this date. 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 Pt lives in a 1 story home with her spouse, ramped entrance, and walk in shower. Pt reports that she came in with L sided weakness, difficulty getting words out (knew what she wanted to say), blurry vision in B eyes, and a headache. Pt reports this is the 3rd time this has happened to her (12/17/18 CVA, 4/20 TIA, and 5/21). Pt currently reports most symptoms have resolved, except "my left arm still feels a little heavy." Pt demonstrates slight deficits in LUE strength (4/5) and [place] with testing (increased time, accuracy good). BLE WFL. Denies sensory deficits. Pt instructed in [facility] ex for LUE, energy conservation and stress mgt strategies to support return to PLOF including work. Pt eager to "figure out what's going on with me so it won't happen again." Pt expressed frustration with not knowing why this was happening to her (CVA work up negative thus far). Active listening and emotional support provided. Pt appreciative. Pt benefited maximally from OT evaluation and treatment. Do not anticipate skilled OT needs. Will sign off. Please re-consult if additional needs arise.     Follow Up Recommendations  No OT follow up    Equipment Recommendations  None recommended by OT    Recommendations for Other Services       Precautions / Restrictions Precautions Precautions:  None Restrictions Weight Bearing Restrictions: No      Mobility Bed Mobility Overal bed mobility: Modified Independent             General bed mobility comments: pt slow, but steady  Transfers Overall transfer level: Modified independent Equipment used: None             General transfer comment: pt slow, but steady    Balance Overall balance assessment: No apparent balance deficits (not formally assessed)                                         ADL either performed or assessed with clinical judgement   ADL Overall ADL's : Modified independent                                       General ADL Comments: Pt near baseline, no significant difficulty performing ADL, pt does report LUE feels "a little heavy still"     Vision Patient Visual Report: Blurring of vision(mild, L>R) Vision Assessment?: No apparent visual deficits Additional Comments: eye alignment, gaze, visual scanning, tracking, accommodation, convergence all appears WNL; will continue to test     Perception     Praxis      Pertinent Vitals/Pain Pain Assessment: No/denies pain     Hand Dominance Left   Extremity/Trunk Assessment Upper Extremity Assessment Upper Extremity Assessment: Overall [facility]  for tasks assessed;LUE deficits/detail LUE Deficits / Details: grossly 4/5, intact sensation, slightly increased time to perform [facility] tasks but functional LUE Coordination: decreased fine motor   Lower Extremity Assessment Lower Extremity Assessment: Overall WFL for tasks assessed;Defer to PT evaluation   Cervical / Trunk Assessment Cervical / Trunk Assessment: Normal   Communication Communication Communication: No difficulties   Cognition Arousal/Alertness: Awake/alert Behavior During Therapy: WFL for tasks assessed/performed Overall Cognitive Status: Within Functional Limits for tasks assessed                                     General Comments        Exercises Other Exercises Other Exercises: pt instructed in [facility] ex for LUE, energy conservation strategies and stress mgt strategies    Shoulder Instructions      Home Living Family/patient expects to be discharged to:: Private residence Living Arrangements: Spouse/significant other Available Help at Discharge: Family;Available PRN/intermittently Type of Home: House Home Access: Ramped entrance     Home Layout: One level     Bathroom Shower/Tub: Walk-in shower;Tub/shower unit   Bathroom Toilet: Handicapped height     Home Equipment: None          Prior Functioning/Environment Level of Independence: Independent        Comments: Pt indep w/ mobility, ADL, IADL, working full time as a Radio broadcast assistant, part time Chartered certified accountant at [employer], denies falls        OT Problem List: Decreased strength;Decreased coordination      OT Treatment/Interventions:      OT Goals(Current goals can be found in the care plan section) Acute Rehab OT Goals Patient Stated Goal: to figure out what's going on and to go home and get some rest OT Goal Formulation: All assessment and education complete, DC therapy  OT Frequency:     Barriers to D/C:            Co-evaluation              AM-PAC OT "6 Clicks" Daily Activity     Outcome Measure Help from another person eating meals?: None Help from another person taking care of personal grooming?: None Help from another person toileting, which includes using toliet, bedpan, or urinal?: None Help from another person bathing (including washing, rinsing, drying)?: None Help from another person to put on and taking off regular upper body clothing?: None Help from another person to put on and taking off regular lower body clothing?: None 6 Click Score: 24   End of Session    Activity Tolerance: Patient tolerated treatment well Patient left: in bed;with call bell/phone within reach  OT Visit Diagnosis: Other abnormalities of gait and  mobility (R26.89);Hemiplegia and hemiparesis Hemiplegia - Right/Left: Left Hemiplegia - dominant/non-dominant: Dominant Hemiplegia - caused by: Unspecified                Time: 3734-2876 OT Time Calculation (min): 26 min Charges:  OT General Charges $OT Visit: 1 Visit OT Evaluation $OT Eval Low Complexity: 1 Low OT Treatments $Therapeutic Activity: 8-22 mins  Jeni Salles, MPH, MS, OTR/L ascom [phone] 05/08/19, 10:00 AM

## 2019-05-08 NOTE — Progress Notes (Signed)
Asked 1C to tube a stroke mapping book to [place], awaiting to send the book.

## 2019-05-08 NOTE — Evaluation (Addendum)
Physical Therapy Evaluation Patient Details Name: Natalie Greene MRN: [id number] DOB: [birth year] Today's Date: 05/08/2019   History of Present Illness  48 y.o. female with a known history of patient is a 48 year old female with known history of hypertension, diabetes mellitus, hyperlipidemia and prior history of TIA who presented to the emergency room with complaints of left-sided weakness, blurry vision and difficulty with speech. Stroke work up negative.  Clinical Impression  Pt in bed upon entry, agreeable to participate. Pt reports significant resolution of symptoms since PTA, but maintains some noticeable weakness in LUE. Pt denies any involvement of LLE, nor ambulatory dysfunction. Balance screening is per baseline level of function, as is gross functional mobility. All education completed, and time is given to address all questions/concerns. No additional skilled PT services needed at this time, PT signing off. PT recommends daily ambulation ad lib or with nursing staff as needed to prevent deconditioning.      Follow Up Recommendations No PT follow up    Equipment Recommendations  None recommended by PT    Recommendations for Other Services       Precautions / Restrictions Precautions Precautions: None Restrictions Weight Bearing Restrictions: No      Mobility  Bed Mobility Overal bed mobility: Independent               Transfers Overall transfer level: Independent Equipment used: None             General transfer comment: at baseline per patient  Ambulation/Gait Ambulation/Gait assistance: Independent Gait Distance (Feet): 225 Feet Assistive device: None Gait Pattern/deviations: WFL(Within Functional Limits)     General Gait Details: at baseline per patient  Stairs            Wheelchair Mobility    Modified Rankin (Stroke Patients Only)       Balance Overall balance assessment: Independent(SLS: ~5sec bilat, history of Rt knee meniscus  injury. No gross assymetry subjetively or objectively.)                                           Pertinent Vitals/Pain Pain Assessment: No/denies pain    Home Living Family/patient expects to be discharged to:: Private residence Living Arrangements: Spouse/significant other Available Help at Discharge: Family;Available PRN/intermittently Type of Home: House Home Access: Ramped entrance     Home Layout: One level Home Equipment: None      Prior Function Level of Independence: Independent         Comments: Pt indep w/ mobility, ADL, IADL, working full time as a Radio broadcast assistant, part time Chartered certified accountant at [employer], denies falls     Hand Dominance   Dominant Hand: Left    Extremity/Trunk Assessment   Upper Extremity Assessment Upper Extremity Assessment: Defer to OT evaluation     Lower Extremity Assessment Lower Extremity Assessment: Overall WFL for tasks assessed    Cervical / Trunk Assessment Cervical / Trunk Assessment: Normal  Communication   Communication: No difficulties  Cognition Arousal/Alertness: Awake/alert Behavior During Therapy: WFL for tasks assessed/performed Overall Cognitive Status: Within Functional Limits for tasks assessed                                        General Comments General comments (skin integrity, edema, etc.): no LOB with spinning in in  place    Exercises    Assessment/Plan    PT Assessment Patent does not need any further PT services  PT Problem List Impaired sensation;Decreased strength       PT Treatment Interventions      PT Goals (Current goals can be found in the Care Plan section)  Acute Rehab PT Goals Patient Stated Goal: to figure out what's going on and to go home and get some rest PT Goal Formulation: All assessment and education complete, DC therapy    Frequency     Barriers to discharge        Co-evaluation               AM-PAC PT "6 Clicks" Mobility  Outcome  Measure Help needed turning from your back to your side while in a flat bed without using bedrails?: None Help needed moving from lying on your back to sitting on the side of a flat bed without using bedrails?: None Help needed moving to and from a bed to a chair (including a wheelchair)?: None Help needed standing up from a chair using your arms (e.g., wheelchair or bedside chair)?: None Help needed to walk in hospital room?: None Help needed climbing 3-5 steps with a railing? : None 6 Click Score: 24    End of Session   Activity Tolerance: Patient tolerated treatment well;No increased pain Patient left: in chair;with chair alarm set   PT Visit Diagnosis: Other symptoms and signs involving the nervous system (G62.694)    Time: 8546-2703 PT Time Calculation (min) (ACUTE ONLY): 10 min   Charges:   PT Evaluation $PT Eval Low Complexity: 1 Low         11:23 AM, 05/08/19 Etta Grandchild, PT, DPT Physical Therapist - [facility]  [phone] ([place])   [place] 05/08/2019, 11:22 AM

## 2019-05-08 NOTE — Progress Notes (Signed)
SLP Cancellation Note  Patient Details Name: CARLO LORSON MRN: [id number] DOB: 05-23-1971   Cancelled treatment:       Reason Eval/Treat Not Completed: SLP screened, no needs identified, will sign off(chart reviewed; consulted NSG, pt in room). Pt denied any difficulty swallowing and is currently on a regular diet; tolerates swallowing pills w/ water per NSG. Pt conversed in conversational on phone, and with NSG/MD w/out deficits noted; pt denied any gross peech-language deficits; she stated she needs to slow down "sometimes" when talking. 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  No further skilled ST services indicated as pt appears at her baseline. Encouraged her to f/u w/ her Neurologist if any new changes. Pt agreed. NSG to reconsult if any change in status.     Orinda Kenner, MS, CCC-SLP Shani Fitch 05/08/2019, 11:34 AM

## 2019-05-09 LAB — HIV ANTIBODY (ROUTINE TESTING W REFLEX): HIV Screen 4th Generation wRfx: NONREACTIVE

## 2019-05-20 ENCOUNTER — Other Ambulatory Visit (HOSPITAL_COMMUNITY): Payer: Self-pay | Admitting: Student

## 2019-05-20 ENCOUNTER — Other Ambulatory Visit: Payer: Self-pay | Admitting: Student

## 2019-05-20 DIAGNOSIS — K219 Gastro-esophageal reflux disease without esophagitis: Secondary | ICD-10-CM

## 2019-05-27 ENCOUNTER — Other Ambulatory Visit: Payer: Self-pay | Admitting: Student

## 2019-05-27 ENCOUNTER — Other Ambulatory Visit: Payer: Self-pay

## 2019-05-27 ENCOUNTER — Ambulatory Visit
Admission: RE | Admit: 2019-05-27 | Discharge: 2019-05-27 | Disposition: A | Discharge status: Home / Self Care | Payer: BC Managed Care – PPO | Source: Ambulatory Visit | Attending: Student | Admitting: Student

## 2019-05-27 DIAGNOSIS — K219 Gastro-esophageal reflux disease without esophagitis: Secondary | ICD-10-CM | POA: Diagnosis not present

## 2019-07-03 ENCOUNTER — Other Ambulatory Visit: Payer: Self-pay

## 2019-07-03 ENCOUNTER — Ambulatory Visit (INDEPENDENT_AMBULATORY_CARE_PROVIDER_SITE_OTHER): Payer: BC Managed Care – PPO | Admitting: Orthopaedic Surgery

## 2019-07-03 DIAGNOSIS — M1711 Unilateral primary osteoarthritis, right knee: Secondary | ICD-10-CM | POA: Diagnosis not present

## 2019-07-03 MED ORDER — TRAMADOL HCL 50 MG PO TABS: 50.0000 mg | ORAL_TABLET | Freq: Four times a day (QID) | ORAL | 1 refills | Status: DC | PRN

## 2019-07-03 MED ORDER — HYLAN G-F 20 48 MG/6ML IX SOSY
48.0000 mg | PREFILLED_SYRINGE | INTRA_ARTICULAR | Status: AC | PRN
  Administered 2019-07-03: 48 mg via INTRA_ARTICULAR

## 2019-07-03 MED ORDER — LIDOCAINE HCL 1 % IJ SOLN
2.0000 mL | INTRAMUSCULAR | Status: AC | PRN
  Administered 2019-07-03: 2 mL

## 2019-07-03 MED ORDER — BUPIVACAINE HCL 0.25 % IJ SOLN
2.0000 mL | INTRAMUSCULAR | Status: AC | PRN
  Administered 2019-07-03: 2 mL via INTRA_ARTICULAR

## 2019-07-03 NOTE — Progress Notes (Signed)
   Procedure Note  Patient: Natalie Greene             Date of Birth: 1971/07/01           MRN: [id number]             Visit Date: 07/03/2019  Procedures: Visit Diagnoses:  1. Unilateral primary osteoarthritis, right knee     Large Joint Inj: R knee on 07/03/2019 8:26 AM Indications: pain Details: 22 G needle, anterolateral approach Medications: 2 mL bupivacaine 0.25 %; 48 mg Hylan 48 MG/6ML; 2 mL lidocaine 1 %

## 2019-07-06 ENCOUNTER — Telehealth: Payer: Self-pay | Admitting: Physician Assistant

## 2019-07-06 NOTE — Telephone Encounter (Signed)
Patient called stated that Natalie Greene gave her a gel injection and Tramadol. Patient called and stated that she needs something else. This is not helping and been in pain all weekend.   Patient requesting Percocet until her surgery.  Please call patient @ [phone]

## 2019-07-06 NOTE — Telephone Encounter (Signed)
I called patient and advised. She would like for you to send in the hydrocodone to [facility] please.

## 2019-07-06 NOTE — Telephone Encounter (Signed)
Please advise 

## 2019-07-06 NOTE — Telephone Encounter (Signed)
I can do one small rx for norco, but unfortunately cannot do any more until after surgery.  Best to get from pcp if needs continued stronger meds until surgery

## 2019-07-06 NOTE — Telephone Encounter (Signed)
Patient called asked if she can get something else for the pain she is experiencing. Patient asked if she can get Percocet until her surgery or cortisone injection. The number to contact patient is [phone]

## 2019-07-06 NOTE — Telephone Encounter (Signed)
Duplicate message in chart already awaiting response from [place].

## 2019-07-07 ENCOUNTER — Other Ambulatory Visit: Payer: Self-pay | Admitting: Physician Assistant

## 2019-07-07 MED ORDER — HYDROCODONE-ACETAMINOPHEN 5-325 MG PO TABS: 1.0000 | ORAL_TABLET | Freq: Every day | ORAL | 0 refills | Status: DC | PRN

## 2019-07-07 NOTE — Telephone Encounter (Signed)
done

## 2019-07-07 NOTE — Telephone Encounter (Signed)
noted 

## 2019-07-10 ENCOUNTER — Telehealth: Payer: Self-pay | Admitting: Orthopaedic Surgery

## 2019-07-10 ENCOUNTER — Other Ambulatory Visit: Payer: Self-pay | Admitting: Physician Assistant

## 2019-07-10 MED ORDER — HYDROCODONE-ACETAMINOPHEN 5-325 MG PO TABS: 1.0000 | ORAL_TABLET | Freq: Every day | ORAL | 0 refills | Status: DC | PRN

## 2019-07-10 NOTE — Telephone Encounter (Signed)
Ok, I sent in one more, but she really needs to use as sparingly as possible as this is last narcotic we can write until after sx

## 2019-07-10 NOTE — Telephone Encounter (Signed)
I called patient and advised. She states that she has tramadol and tylenol 3 and neither of these help her. She is awaiting surgery, but was told she could not have it until 6 months after last stroke. She is on her leg a lot. The only thing that has helped is the hydrocodone. I advised patient this is not something that can continue to be called in by our office. She would like you to know that she is not a drug addict, is awaiting an appt with her PCP, and awaiting surgery. She wanted me to send message back to you to see if you could prescribe just until she could see PCP.  I explained to patient there may be nothing else that you could do. Please advise.

## 2019-07-10 NOTE — Telephone Encounter (Signed)
I called patient and advised. 

## 2019-07-10 NOTE — Telephone Encounter (Signed)
Patient called advised she is not scheduled with her PCP until 07/29/2019 and asked if she can get a refill for Hydrocodone until than. The number to contact [place] is [phone]

## 2019-07-10 NOTE — Telephone Encounter (Signed)
Unfortunately, we can only do one narcotic.  I am happy to call in tramadol or even tylenol #3 if she would like

## 2019-07-10 NOTE — Telephone Encounter (Signed)
I called patient and advised. She states that she spoke with the pharmacy and that she was told they had received a rx but the instructions had not changed. I explained that it looks like rx was received by pharmacy and that directions are one daily as needed for pain. I did advise patient that pharmacy has not contacted [place] with a problem.

## 2019-07-10 NOTE — Telephone Encounter (Signed)
Please advise 

## 2019-07-13 ENCOUNTER — Telehealth: Payer: Self-pay | Admitting: Orthopaedic Surgery

## 2019-07-13 NOTE — Telephone Encounter (Signed)
Patient called stating that the pharmacy did receive her RX, but the way it was worded the pharmacy is unable to fill it until next week, however, the pharmacy advised the patient that if the provider would send in a new RX or take the word "Daily" off the RX, they would be able to fill it today.  CB#[phone].  Thank you.

## 2019-07-13 NOTE — Telephone Encounter (Signed)
Please advise 

## 2019-07-14 NOTE — Telephone Encounter (Signed)
I called pharmacy. Had to leave voicemail for them to return my call to discuss.

## 2019-07-14 NOTE — Telephone Encounter (Signed)
Will you call pharmacy and see what the deal is?  I wrote daily prn.  Not sure why they will not fill this

## 2019-07-15 NOTE — Telephone Encounter (Signed)
I called pharmacy and had to leave another message requesting return call.

## 2019-07-16 ENCOUNTER — Telehealth: Payer: Self-pay | Admitting: Orthopaedic Surgery

## 2019-07-16 NOTE — Telephone Encounter (Signed)
Natalie Greene with Natalie Greene returned call asked for a call back. The number to contact Natalie Greene is [phone]

## 2019-07-16 NOTE — Telephone Encounter (Signed)
I left voicemail at pharmacy for [place]. You do not have an option to speak with anyone, only to leave message and wait for them to return call.

## 2019-07-17 NOTE — Telephone Encounter (Signed)
Still unable to reach pharmacy and have not gotten a call back.

## 2019-07-17 NOTE — Telephone Encounter (Signed)
FYI. I have called this pharmacy on multiple occasions to ask about medication. I have had to leave multiple messages without ever being able to speak with someone. At this point, patient more likely to get medication.

## 2019-07-17 NOTE — Telephone Encounter (Signed)
Ok, thanks.

## 2019-07-21 ENCOUNTER — Emergency Department: Payer: BC Managed Care – PPO

## 2019-07-21 ENCOUNTER — Emergency Department
Admission: EM | Admit: 2019-07-21 | Discharge: 2019-07-21 | Disposition: A | Discharge status: Home / Self Care | Payer: BC Managed Care – PPO | Attending: Emergency Medicine | Admitting: Emergency Medicine

## 2019-07-21 ENCOUNTER — Other Ambulatory Visit: Payer: Self-pay

## 2019-07-21 DIAGNOSIS — Z79899 Other long term (current) drug therapy: Secondary | ICD-10-CM | POA: Insufficient documentation

## 2019-07-21 DIAGNOSIS — Z8673 Personal history of transient ischemic attack (TIA), and cerebral infarction without residual deficits: Secondary | ICD-10-CM | POA: Diagnosis not present

## 2019-07-21 DIAGNOSIS — R0602 Shortness of breath: Secondary | ICD-10-CM | POA: Diagnosis not present

## 2019-07-21 DIAGNOSIS — Z7984 Long term (current) use of oral hypoglycemic drugs: Secondary | ICD-10-CM | POA: Diagnosis not present

## 2019-07-21 DIAGNOSIS — R0600 Dyspnea, unspecified: Secondary | ICD-10-CM | POA: Diagnosis not present

## 2019-07-21 DIAGNOSIS — I10 Essential (primary) hypertension: Secondary | ICD-10-CM | POA: Insufficient documentation

## 2019-07-21 DIAGNOSIS — E119 Type 2 diabetes mellitus without complications: Secondary | ICD-10-CM | POA: Diagnosis not present

## 2019-07-21 DIAGNOSIS — R002 Palpitations: Secondary | ICD-10-CM | POA: Diagnosis not present

## 2019-07-21 DIAGNOSIS — Z85828 Personal history of other malignant neoplasm of skin: Secondary | ICD-10-CM | POA: Insufficient documentation

## 2019-07-21 LAB — CBC WITH DIFFERENTIAL/PLATELET
Abs Immature Granulocytes: 0.03 10*3/uL (ref 0.00–0.07)
Basophils Absolute: 0 10*3/uL (ref 0.0–0.1)
Basophils Relative: 0 %
Eosinophils Absolute: 0.1 10*3/uL (ref 0.0–0.5)
Eosinophils Relative: 1 %
HCT: 39.1 % (ref 36.0–46.0)
Hemoglobin: 12.7 g/dL (ref 12.0–15.0)
Immature Granulocytes: 0 %
Lymphocytes Relative: 14 %
Lymphs Abs: 1.5 10*3/uL (ref 0.7–4.0)
MCH: 28.2 pg (ref 26.0–34.0)
MCHC: 32.5 g/dL (ref 30.0–36.0)
MCV: 86.7 fL (ref 80.0–100.0)
Monocytes Absolute: 0.6 10*3/uL (ref 0.1–1.0)
Monocytes Relative: 6 %
Neutro Abs: 8 10*3/uL — ABNORMAL HIGH (ref 1.7–7.7)
Neutrophils Relative %: 79 %
Platelets: 242 10*3/uL (ref 150–400)
RBC: 4.51 MIL/uL (ref 3.87–5.11)
RDW: 12.8 % (ref 11.5–15.5)
WBC: 10.2 10*3/uL (ref 4.0–10.5)
nRBC: 0 % (ref 0.0–0.2)

## 2019-07-21 LAB — BASIC METABOLIC PANEL
Anion gap: 5 (ref 5–15)
BUN: 15 mg/dL (ref 6–20)
CO2: 27 mmol/L (ref 22–32)
Calcium: 9 mg/dL (ref 8.9–10.3)
Chloride: 104 mmol/L (ref 98–111)
Creatinine, Ser: 0.68 mg/dL (ref 0.44–1.00)
GFR calc Af Amer: >60 mL/min (ref >60)
GFR calc non Af Amer: >60 mL/min (ref >60)
Glucose, Bld: 128 mg/dL — ABNORMAL HIGH (ref 70–99)
Potassium: 4.1 mmol/L (ref 3.5–5.1)
Sodium: 136 mmol/L (ref 135–145)

## 2019-07-21 LAB — TSH: TSH: 1.806 u[IU]/mL (ref 0.350–4.500)

## 2019-07-21 LAB — MAGNESIUM: Magnesium: 1.9 mg/dL (ref 1.7–2.4)

## 2019-07-21 NOTE — ED Triage Notes (Signed)
Pt states she has been experiencing palpitations x 2days. Pt states palpitations occur at night when sleeping.

## 2019-07-21 NOTE — ED Provider Notes (Signed)
[facility] Emergency Department Provider Note   ____________________________________________   First MD Initiated Contact with Patient 07/21/19 1019     (approximate)  I have reviewed the triage vital signs and the nursing notes.   HISTORY  Chief Complaint Palpitations (x2 days)    HPI Natalie Greene is a 48 y.o. female 48 year old female with possible history of hypertension, diabetes, and anxiety presents to the ED complaining of palpitations and shortness of breath.  Patient reports she has been having intermittent episodes over the past couple of nights where she wakes up gasping for air.  She will also feel an intense fluttering in her chest, like her heart is racing.  She denies any associated chest pain and she does not have any shortness of breath outside of these episodes.  She does notice that she has had a cough recently, but denies any fevers or sick contacts.  She has not noticed any pain or swelling in her legs, denies any recent travel, chemotherapy, or surgery.  She has had similar episodes in the past and completed a Holter monitor study approximately 1 month ago, but has not heard back about the results.        Past Medical History:  Diagnosis Date  . Anxiety   . Cancer ([place])   . Diabetes mellitus without complication ([place])   . GERD (gastroesophageal reflux disease)   . Hypertension   . Lupus ([place])   . Squamous cell carcinoma   . Stroke [facility])    Stroke in Jan-2020 / TIA -April 2020    Patient Active Problem List   Diagnosis Date Noted  . TIA (transient ischemic attack) 05/07/2019  . Vaginitis and vulvovaginitis 04/15/2016  . Right knee pain 03/09/2016  . GERD (gastroesophageal reflux disease) 03/09/2016  . Influenza with respiratory manifestation 02/14/2016  . H/O cold sores 06/21/2015  . Diabetes mellitus type II, controlled ([place]) 06/13/2015  . Hypokalemia 05/25/2015  . Encounter to establish care 02/07/2015  . Severe  obesity (BMI >= 40) ([place]) 02/07/2015  . Generalized anxiety disorder 02/07/2015    Past Surgical History:  Procedure Laterality Date  . ABDOMINAL HYSTERECTOMY     approx 5 years ago from 2017   . CHOLECYSTECTOMY    . WISDOM TOOTH EXTRACTION      Prior to Admission medications   Medication Sig Start Date End Date Taking? Authorizing Provider  ALPRAZolam (XANAX) 1 MG tablet TAKE ONE TABLET BY MOUTH AT BEDTIME AS NEEDED FOR ANXIETY Patient taking differently: Take 1 mg by mouth at bedtime as needed for sleep.  10/10/16   Leone Haven, MD  Ascorbic Acid (VITAMIN C PO) Take 1 tablet by mouth daily.    [provider]  aspirin 325 MG tablet Take 1 tablet (325 mg total) by mouth daily. 05/08/19   Fritzi Mandes, MD  atorvastatin (LIPITOR) 20 MG tablet Take 1 tablet (20 mg total) by mouth daily. 03/20/19 03/19/20  Carrie Mew, MD  escitalopram (LEXAPRO) 10 MG tablet Take 10 mg by mouth daily. 05/02/19   [provider]  esomeprazole (NEXIUM) 40 MG capsule Take 1 capsule (40 mg total) by mouth daily at 12 noon. 09/04/16   Leone Haven, MD  furosemide (LASIX) 20 MG tablet Take 20 mg by mouth daily.     [provider]  HYDROcodone-acetaminophen (NORCO) 5-325 MG tablet Take 1 tablet by mouth daily as needed for moderate pain. 07/10/19   Aundra Dubin, PA-C  linagliptin (TRADJENTA) 5 MG  TABS tablet Take 5 mg by mouth daily.    [provider]  lisinopril (PRINIVIL,ZESTRIL) 5 MG tablet Take 1 tablet (5 mg total) by mouth daily. 03/20/19 03/19/20  Carrie Mew, MD  meloxicam (MOBIC) 7.5 MG tablet Take 7.5 mg by mouth 2 (two) times a day.     [provider]  traMADol (ULTRAM) 50 MG tablet Take 1 tablet (50 mg total) by mouth every 6 (six) hours as needed. 07/03/19   Aundra Dubin, PA-C  valACYclovir (VALTREX) 500 MG tablet TAKE 1 TABLET (500 MG TOTAL) BY MOUTH 2 (TWO) TIMES DAILY. Patient taking differently: Take 500 mg by mouth 2 (two) times  daily as needed (for fever blisters or as directed).  10/17/16   Leone Haven, MD  VITAMIN E PO Take 1 capsule by mouth daily.    [provider]    Allergies Patient has no known allergies.  Family History  Problem Relation Age of Onset  . Cancer Father        colon and prostate  . Diabetes Father   . Breast cancer Other     Social History Social History   Tobacco Use  . Smoking status: Never Smoker  . Smokeless tobacco: Never Used  Substance Use Topics  . Alcohol use: Yes    Alcohol/week: 0.0 standard drinks  . Drug use: No    Review of Systems  Constitutional: No fever/chills Eyes: No visual changes. ENT: No sore throat. Cardiovascular: Denies chest pain.  Positive for palpitations. Respiratory: Positive for shortness of breath.  Positive for cough. Gastrointestinal: No abdominal pain.  No nausea, no vomiting.  No diarrhea.  No constipation. Genitourinary: Negative for dysuria. Musculoskeletal: Negative for back pain. Skin: Negative for rash. Neurological: Negative for headaches, focal weakness or numbness.  ____________________________________________   PHYSICAL EXAM:  VITAL SIGNS: ED Triage Vitals  Enc Vitals Group     BP 07/21/19 1013 117/76     Pulse Rate 07/21/19 1013 74     Resp 07/21/19 1013 18     Temp 07/21/19 1013 99.3 F (37.4 C)     Temp Source 07/21/19 1013 Oral     SpO2 07/21/19 1013 99 %     Weight 07/21/19 1014 210 lb (95.3 kg)     Height 07/21/19 1014 5\' 1"  (1.549 m)     Head Circumference --      Peak Flow --      Pain Score --      Pain Loc --      Pain Edu? --      Excl. in [place]? --     Constitutional: Alert and oriented. Eyes: Conjunctivae are normal. Head: Atraumatic. Nose: No congestion/rhinnorhea. Mouth/Throat: Mucous membranes are moist. Neck: Normal ROM Cardiovascular: Normal rate, regular rhythm. Grossly normal heart sounds.  2+ radial pulses bilaterally. Respiratory: Normal respiratory effort.  No  retractions. Lungs CTAB. Gastrointestinal: Soft and nontender. No distention. Genitourinary: deferred Musculoskeletal: No lower extremity tenderness nor edema. Neurologic:  Normal speech and language. No gross focal neurologic deficits are appreciated. Skin:  Skin is warm, dry and intact. No rash noted. Psychiatric: Mood and affect are normal. Speech and behavior are normal.  ____________________________________________   LABS (all labs ordered are listed, but only abnormal results are displayed)  Labs Reviewed  CBC WITH DIFFERENTIAL/PLATELET - Abnormal; Notable for the following components:      Result Value   Neutro Abs 8.0 (*)    All other components within normal limits  BASIC  METABOLIC PANEL - Abnormal; Notable for the following components:   Glucose, Bld 128 (*)    All other components within normal limits  TSH  MAGNESIUM   ____________________________________________  EKG  ED ECG REPORT I, Blake Divine, the attending physician, personally viewed and interpreted this ECG.   Date: 07/21/2019  EKG Time: 4:08 PM  Rate: 70  Rhythm: normal EKG, normal sinus rhythm, unchanged from previous tracings  Axis: Normal  Intervals:none  ST&T Change: No ischemic changes, poor R wave progression    PROCEDURES  Procedure(s) performed (including Critical Care):  Procedures   ____________________________________________   INITIAL IMPRESSION / ASSESSMENT AND PLAN / ED COURSE       48 year old female with history of hypertension, diabetes, stroke, and anxiety presents to the ED complaining of episodes of palpitations and waking up gasping for air.  Differential includes arrhythmia, electrolyte abnormality, hyperthyroidism, anxiety, pneumonia, OSA.  EKG without evidence of arrhythmia, ACS, or arrhythmia genic focus.  Do not suspect ACS given patient does not describe any chest pain.  No apparent episodes during patient stay in the ED and she remained in sinus rhythm  throughout.  Lab work, including electrolytes, is unremarkable.  There may be a component of OSA given patient's described waking up from sleep gasping for air.  Counseled patient to follow-up with her PCP for results of prior Holter monitor study and return to the ED for new or worsening symptoms.  Patient agrees with plan.      ____________________________________________   FINAL CLINICAL IMPRESSION(S) / ED DIAGNOSES  Final diagnoses:  Heart palpitations  Paroxysmal nocturnal dyspnea     ED Discharge Orders    None       Note:  This document was prepared using Dragon voice recognition software and may include unintentional dictation errors.   Blake Divine, MD 07/21/19 [phone]

## 2019-07-24 ENCOUNTER — Ambulatory Visit: Payer: BC Managed Care – PPO | Admitting: Physician Assistant

## 2019-08-17 ENCOUNTER — Telehealth: Payer: Self-pay | Admitting: Orthopaedic Surgery

## 2019-08-17 NOTE — Telephone Encounter (Signed)
Please advise 

## 2019-08-17 NOTE — Telephone Encounter (Signed)
Patient called wanting to know if Dr. Erlinda Hong would approve her for a temporary handicap placard to last her until she has her knee surgery.  She also wanted to know if the form could be emailed to her once Dr. Erlinda Hong fills out his part.  CB#[phone].  Thank you.

## 2019-08-18 NOTE — Telephone Encounter (Signed)
IC advised could pick up at front desk.  

## 2019-08-18 NOTE — Telephone Encounter (Signed)
Ok to do 6 mo temp.  No sure about mail part?

## 2019-10-06 ENCOUNTER — Ambulatory Visit: Payer: BC Managed Care – PPO | Admitting: Orthopaedic Surgery

## 2019-10-08 ENCOUNTER — Other Ambulatory Visit: Payer: Self-pay | Admitting: Internal Medicine

## 2019-10-08 DIAGNOSIS — N6320 Unspecified lump in the left breast, unspecified quadrant: Secondary | ICD-10-CM

## 2019-10-13 ENCOUNTER — Other Ambulatory Visit: Payer: Self-pay | Admitting: Neurology

## 2019-10-13 ENCOUNTER — Other Ambulatory Visit: Payer: Self-pay | Admitting: Internal Medicine

## 2019-10-13 DIAGNOSIS — N6320 Unspecified lump in the left breast, unspecified quadrant: Secondary | ICD-10-CM

## 2019-10-13 DIAGNOSIS — R2990 Unspecified symptoms and signs involving the nervous system: Secondary | ICD-10-CM

## 2019-10-14 ENCOUNTER — Other Ambulatory Visit: Payer: Self-pay | Admitting: Internal Medicine

## 2019-10-14 DIAGNOSIS — R2232 Localized swelling, mass and lump, left upper limb: Secondary | ICD-10-CM

## 2019-10-14 DIAGNOSIS — N6320 Unspecified lump in the left breast, unspecified quadrant: Secondary | ICD-10-CM

## 2019-10-16 ENCOUNTER — Ambulatory Visit
Admission: RE | Admit: 2019-10-16 | Discharge: 2019-10-16 | Disposition: A | Discharge status: Home / Self Care | Payer: BC Managed Care – PPO | Source: Ambulatory Visit | Attending: Internal Medicine | Admitting: Internal Medicine

## 2019-10-16 DIAGNOSIS — R2232 Localized swelling, mass and lump, left upper limb: Secondary | ICD-10-CM | POA: Diagnosis present

## 2019-10-16 DIAGNOSIS — N6320 Unspecified lump in the left breast, unspecified quadrant: Secondary | ICD-10-CM | POA: Diagnosis present

## 2019-10-22 ENCOUNTER — Other Ambulatory Visit: Payer: Self-pay

## 2019-10-22 ENCOUNTER — Ambulatory Visit
Admission: RE | Admit: 2019-10-22 | Discharge: 2019-10-22 | Disposition: A | Discharge status: Home / Self Care | Payer: BC Managed Care – PPO | Source: Ambulatory Visit | Attending: Neurology | Admitting: Neurology

## 2019-10-22 DIAGNOSIS — R2990 Unspecified symptoms and signs involving the nervous system: Secondary | ICD-10-CM | POA: Diagnosis not present

## 2019-11-27 IMAGING — RF UPPER GI SERIES W/HIGH DENSITY WITHOUT KUB
14 of 19 series · 14 of 19 positions shown · non-contrast
Comparison: None.

CLINICAL DATA: Stomach pains per year

EXAM:
UPPER GI SERIES WITHOUT KUB
TECHNIQUE: Routine upper GI series was performed with thin/high density/water
soluble barium.
FLUOROSCOPY TIME:  Fluoroscopy Time:  0.4 minute
Radiation Exposure Index (if provided by the fluoroscopic device):
3.6 mGy
Number of Acquired Spot Images: 0

[Series 1: cp_standard · 0.25mm/px · 1 of 1 slices shown (1 of 14)]
[im 1/1]
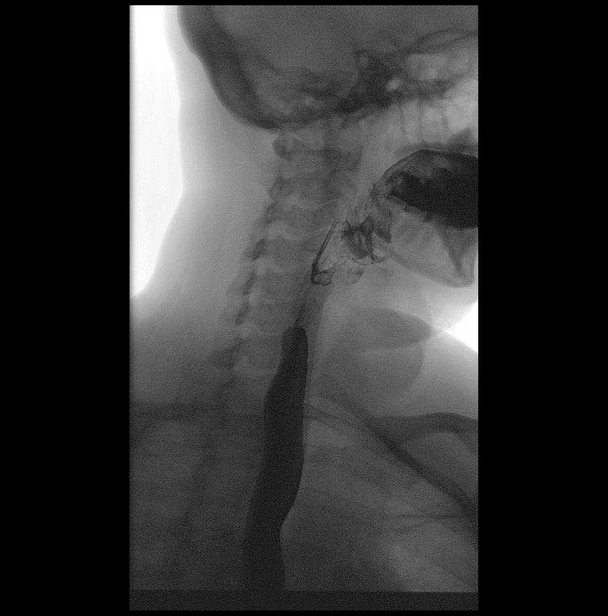

[Series 3: cp_standard · 0.25mm/px · 1 of 1 slices shown (2 of 14)]
[im 1/1]
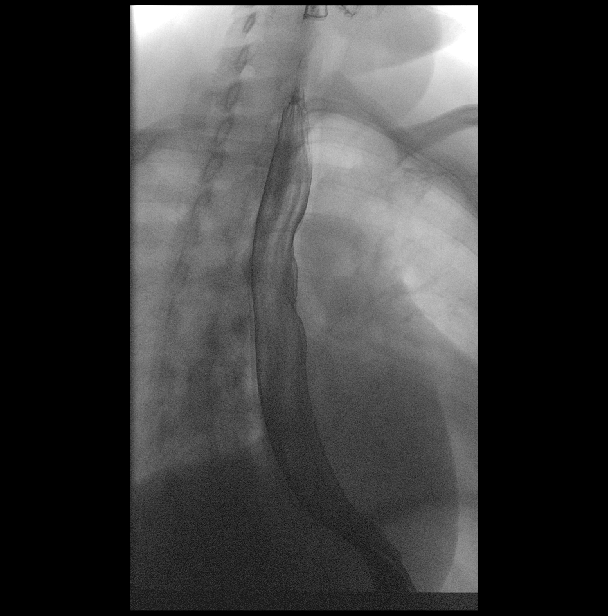

[Series 4: cp_standard · 0.25mm/px · 1 of 1 slices shown (3 of 14)]
[im 1/1]
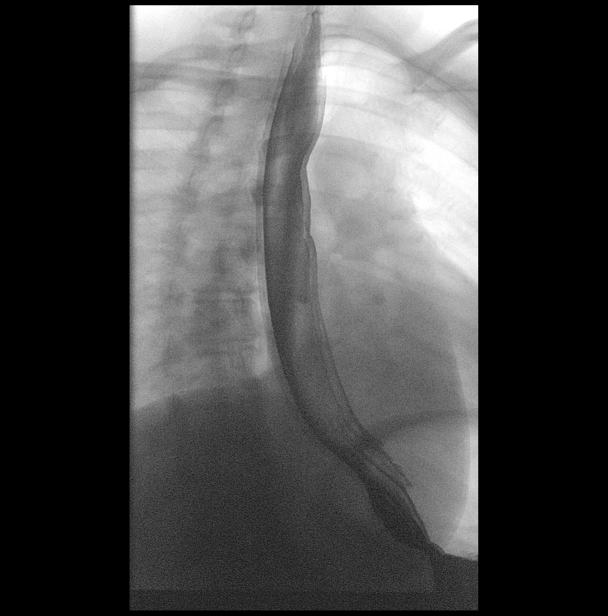

[Series 5: cp_standard · 0.25mm/px · 1 of 1 slices shown (4 of 14)]
[im 1/1]
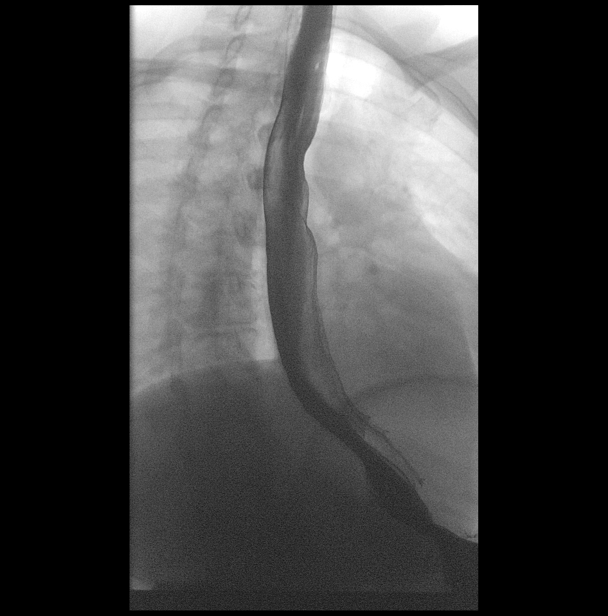

[Series 7: cp_standard · 0.28mm/px · 1 of 1 slices shown (5 of 14)]
[im 1/1]
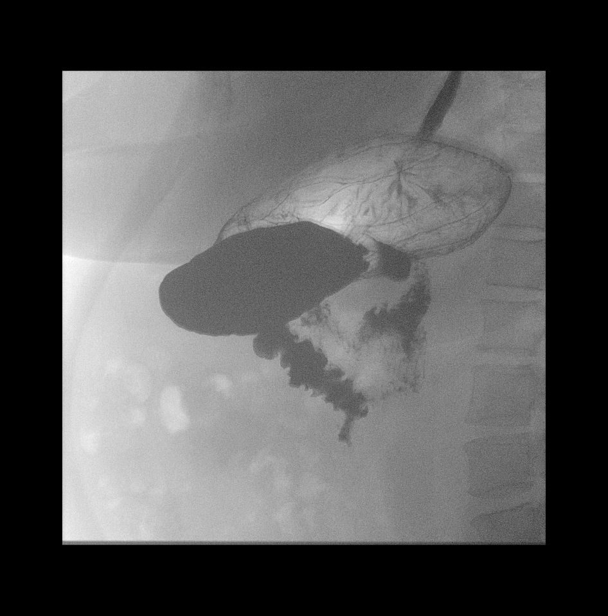

[Series 8: cp_standard · 0.28mm/px · 1 of 1 slices shown (6 of 14)]
[im 1/1]
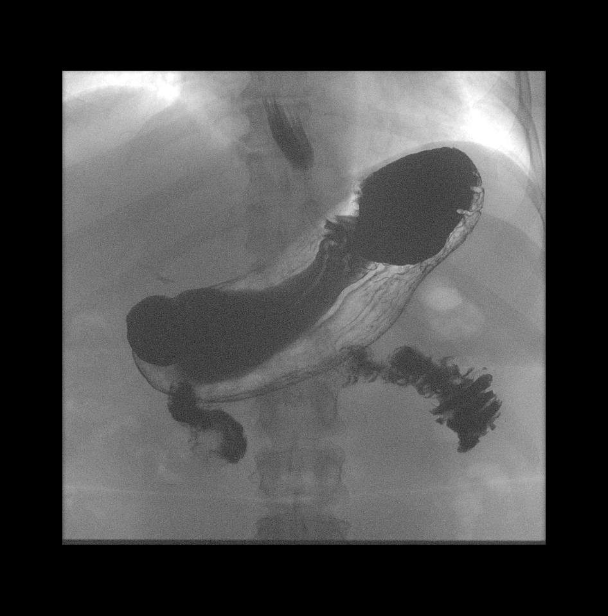

[Series 9: cp_standard · 0.28mm/px · 1 of 1 slices shown (7 of 14)]
[im 1/1]
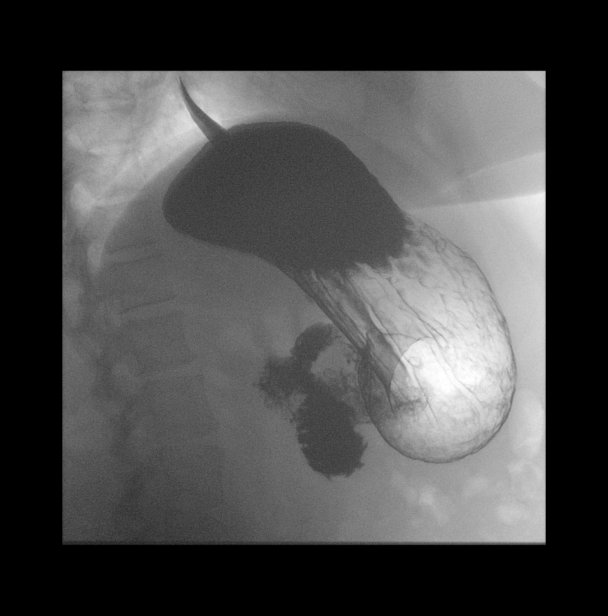

[Series 11: cp_standard · 0.28mm/px · 1 of 1 slices shown (8 of 14)]
[im 1/1]
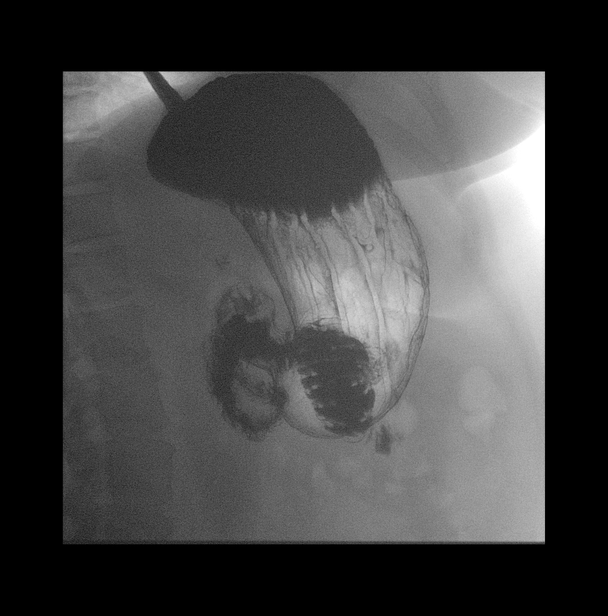

[Series 12: cp_standard · 0.28mm/px · 1 of 1 slices shown (9 of 14)]
[im 1/1]
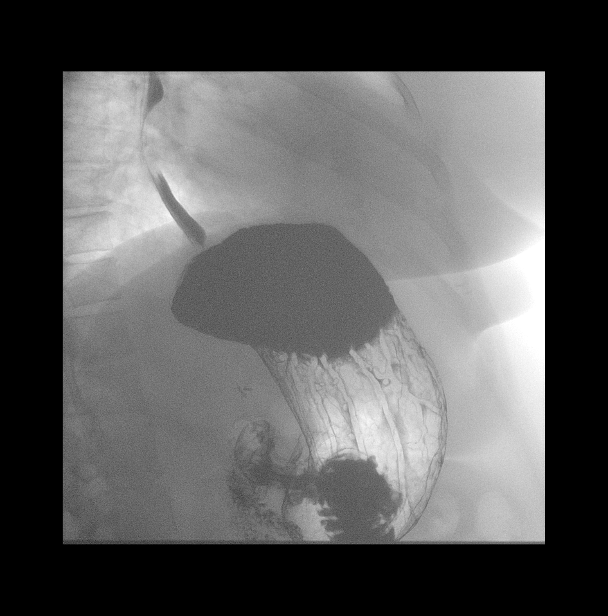

[Series 13: cp_standard · 0.29mm/px · 1 of 1 slices shown (10 of 14)]
[im 1/1]
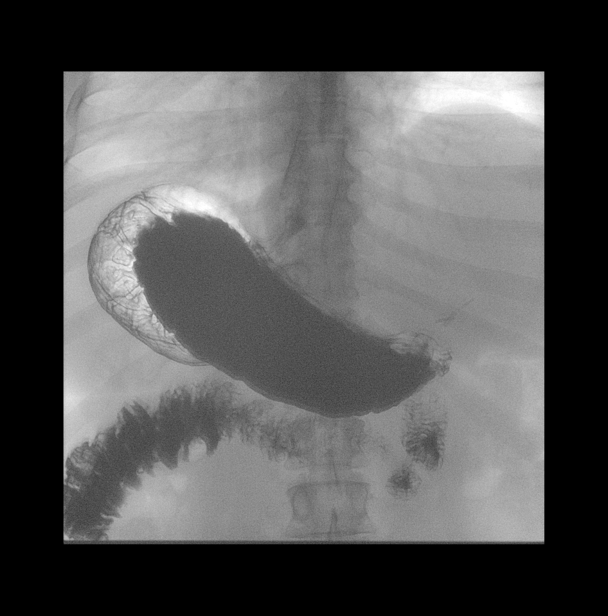

[Series 15: cp_standard · 0.28mm/px · 1 of 1 slices shown (11 of 14)]
[im 1/1]
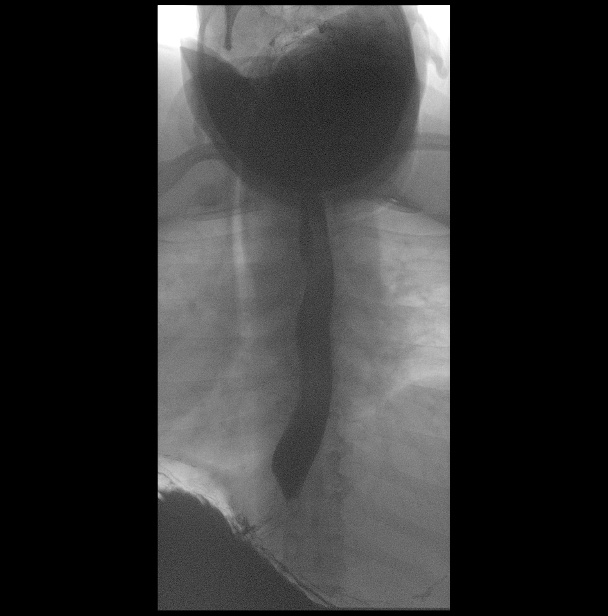

[Series 16: cp_standard · 0.28mm/px · 1 of 1 slices shown (12 of 14)]
[im 1/1]
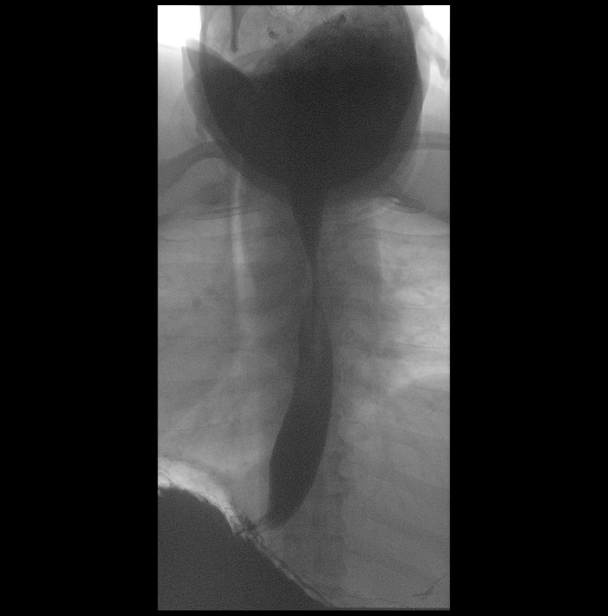

[Series 17: cp_standard · 0.29mm/px · 1 of 1 slices shown (13 of 14)]
[im 1/1]
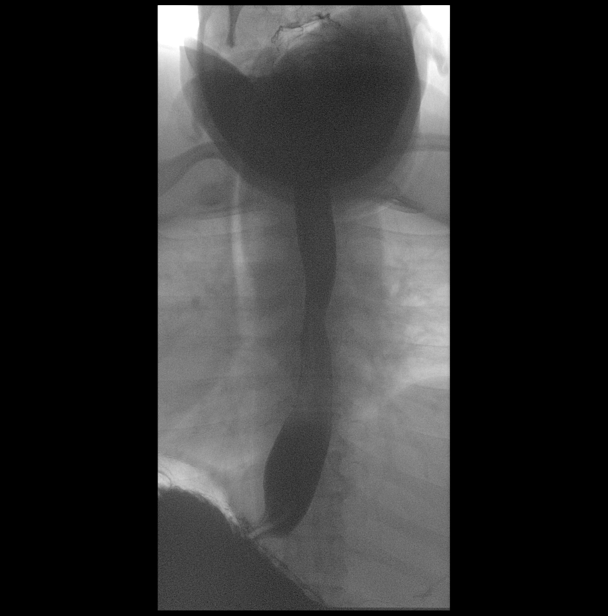

[Series 19: cp_standard · 0.29mm/px · 1 of 1 slices shown (14 of 14)]
[im 1/1]
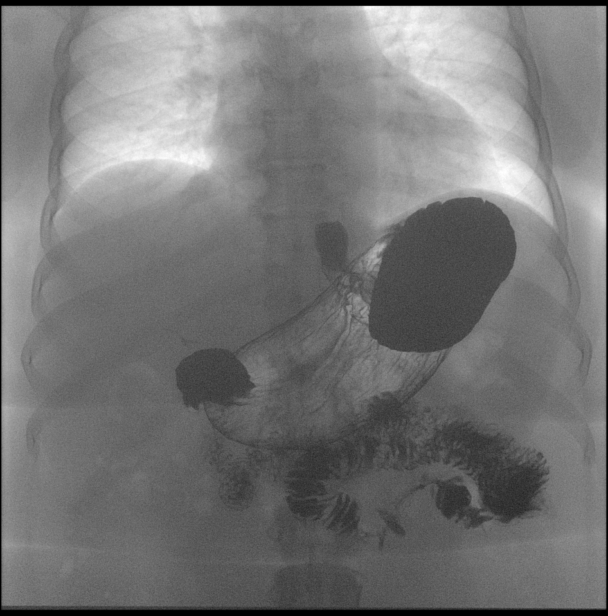

[14 of 19 positions shown; findings below may reference images not displayed]

FINDINGS: Examination of the esophagus demonstrated normal esophageal
motility. Normal esophageal morphology without evidence of
esophagitis or ulceration. No esophageal stricture, diverticula, or
mass lesion. No evidence of hiatal hernia. Mild gastroesophageal
reflux.

Examination of the stomach demonstrated normal rugal folds and areae
gastricae. The gastric mucosa appeared unremarkable without evidence
of ulceration, scarring, or mass lesion. Gastric motility and
emptying was normal. Fluoroscopic examination of the duodenum
demonstrates normal motility and morphology without evidence of
ulceration or mass lesion.
IMPRESSION: 1. Mild gastroesophageal reflux.  Otherwise normal upper GI series.

## 2020-01-07 DIAGNOSIS — M1711 Unilateral primary osteoarthritis, right knee: Secondary | ICD-10-CM | POA: Insufficient documentation

## 2020-01-08 ENCOUNTER — Ambulatory Visit (INDEPENDENT_AMBULATORY_CARE_PROVIDER_SITE_OTHER): Payer: BC Managed Care – PPO

## 2020-01-08 ENCOUNTER — Ambulatory Visit (INDEPENDENT_AMBULATORY_CARE_PROVIDER_SITE_OTHER): Payer: BC Managed Care – PPO | Admitting: Orthopaedic Surgery

## 2020-01-08 ENCOUNTER — Other Ambulatory Visit: Payer: Self-pay

## 2020-01-08 ENCOUNTER — Encounter: Payer: Self-pay | Admitting: Orthopaedic Surgery

## 2020-01-08 VITALS — Ht 60.0 in | Wt 225.0 lb

## 2020-01-08 DIAGNOSIS — Z6841 Body Mass Index (BMI) 40.0 and over, adult: Secondary | ICD-10-CM | POA: Diagnosis not present

## 2020-01-08 DIAGNOSIS — M1711 Unilateral primary osteoarthritis, right knee: Secondary | ICD-10-CM | POA: Diagnosis not present

## 2020-01-08 MED ORDER — LIDOCAINE HCL 1 % IJ SOLN
2.0000 mL | INTRAMUSCULAR | Status: AC | PRN
  Administered 2020-01-08: 10:00:00 2 mL

## 2020-01-08 MED ORDER — PENNSAID 2 % EX SOLN: 2.0000 g | Freq: Two times a day (BID) | CUTANEOUS | 3 refills | Status: AC | PRN

## 2020-01-08 MED ORDER — BUPIVACAINE HCL 0.5 % IJ SOLN
2.0000 mL | INTRAMUSCULAR | Status: AC | PRN
  Administered 2020-01-08: 10:00:00 2 mL via INTRA_ARTICULAR

## 2020-01-08 MED ORDER — METHYLPREDNISOLONE ACETATE 40 MG/ML IJ SUSP
40.0000 mg | INTRAMUSCULAR | Status: AC | PRN
  Administered 2020-01-08: 10:00:00 40 mg via INTRA_ARTICULAR

## 2020-01-08 NOTE — Progress Notes (Signed)
Office Visit Note   Patient: Natalie Greene           Date of Birth: 03/06/1971           MRN: [id number] Visit Date: 01/08/2020              Requested by: Idelle Crouch, MD [facility] [place],  [place] [address] PCP: Idelle Crouch, MD   Assessment & Plan: Visit Diagnoses:  1. Primary osteoarthritis of right knee   2. Body mass index 40.0-44.9, adult ([place])   3. Morbid obesity ([place])     Plan: Impression is end-stage right knee DJD.  20 cc of joint fluid aspirated from the knee and cortisone injection provided today.  We discussed the importance of weight loss and how this affects knee pain.  We will make a referral to Dr. Leafy Ro to help with this.  Her target weight is around 200 pounds to achieve a BMI of less than 40.  She will need preoperative medical clearance prior to scheduling a knee replacement due to her previous history of TIA and CVA.  Follow-Up Instructions: Return if symptoms worsen or fail to improve.   Orders:  Orders Placed This Encounter  Procedures  . Large Joint Inj: R knee  . XR KNEE 3 VIEW RIGHT  . Amb Ref to Medical Weight Management   Meds ordered this encounter  Medications  . Diclofenac Sodium (PENNSAID) 2 % SOLN    Sig: Apply 2 g topically 2 (two) times daily as needed (to affected area).    Dispense:  112 g    Refill:  3      Procedures: Large Joint Inj: R knee on 01/08/2020 10:22 AM Indications: pain Details: 22 G needle  Arthrogram: No  Medications: 40 mg methylPREDNISolone acetate 40 MG/ML; 2 mL lidocaine 1 %; 2 mL bupivacaine 0.5 % Consent was given by the patient. Patient was prepped and draped in the usual sterile fashion.       Clinical Data: No additional findings.   Subjective: Chief Complaint  Patient presents with  . Right Knee - Pain    Natalie Greene is a 49 year old female comes in for follow-up of chronic right knee pain.  She states that recently has felt much worse.  She traveled  to [place] to visit her son and could barely make it through the trip due to the right knee pain.  She states that prior cortisone injections and Synvisc injections have not provided her with any significant relief.  She is currently taking Celebrex which dulls the pain.  She does have a history of a CVA and TIA last year.  She is diabetic and most recent A1c was 6.8.   Review of Systems  Constitutional: Negative.   HENT: Negative.   Eyes: Negative.   Respiratory: Negative.   Cardiovascular: Negative.   Endocrine: Negative.   Musculoskeletal: Negative.   Neurological: Negative.   Hematological: Negative.   Psychiatric/Behavioral: Negative.   All other systems reviewed and are negative.    Objective: Vital Signs: Ht 5' (1.524 m)   Wt 225 lb (102.1 kg)   BMI 43.94 kg/m   Physical Exam Vitals and nursing note reviewed.  Constitutional:      Appearance: She is well-developed.  Pulmonary:     Effort: Pulmonary effort is normal.  Skin:    General: Skin is warm.     Capillary Refill: Capillary refill takes less than 2 seconds.  Neurological:  Mental Status: She is alert and oriented to person, place, and time.  Psychiatric:        Behavior: Behavior normal.        Thought Content: Thought content normal.        Judgment: Judgment normal.     Ortho Exam Right knee exam shows a small joint effusion with very mild restriction range of motion.  Collaterals and cruciates are stable. Specialty Comments:  No specialty comments available.  Imaging: No results found.   PMFS History: Patient Active Problem List   Diagnosis Date Noted  . Body mass index 40.0-44.9, adult ([place]) 01/08/2020  . Morbid obesity ([place]) 01/08/2020  . Primary osteoarthritis of right knee 01/07/2020  . TIA (transient ischemic attack) 05/07/2019  . Vaginitis and vulvovaginitis 04/15/2016  . Right knee pain 03/09/2016  . GERD (gastroesophageal reflux disease) 03/09/2016  . Influenza with respiratory  manifestation 02/14/2016  . H/O cold sores 06/21/2015  . Diabetes mellitus type II, controlled ([place]) 06/13/2015  . Hypokalemia 05/25/2015  . Encounter to establish care 02/07/2015  . Severe obesity (BMI >= 40) ([place]) 02/07/2015  . Generalized anxiety disorder 02/07/2015   Past Medical History:  Diagnosis Date  . Anxiety   . Cancer ([place])   . Diabetes mellitus without complication ([place])   . GERD (gastroesophageal reflux disease)   . Hypertension   . Lupus ([place])   . Squamous cell carcinoma   . Stroke [facility])    Stroke in Jan-2020 / TIA -April 2020    Family History  Problem Relation Age of Onset  . Cancer Father        colon and prostate  . Diabetes Father   . Breast cancer Other     Past Surgical History:  Procedure Laterality Date  . ABDOMINAL HYSTERECTOMY     approx 5 years ago from 2017   . CHOLECYSTECTOMY    . WISDOM TOOTH EXTRACTION     Social History   Occupational History  . Not on file  Tobacco Use  . Smoking status: Never Smoker  . Smokeless tobacco: Never Used  Substance and Sexual Activity  . Alcohol use: Yes    Alcohol/week: 0.0 standard drinks  . Drug use: No  . Sexual activity: Not Currently    Partners: Male

## 2020-02-01 ENCOUNTER — Telehealth: Payer: Self-pay | Admitting: Orthopaedic Surgery

## 2020-02-01 NOTE — Telephone Encounter (Signed)
Patient called advised she can not keep standing up on her leg because of the swelling. Patient asked  If Dr Erlinda Hong will consider doing surgery. Patient said she can't function to loose the weight because of her leg hurting so bad. Patient asked for a call back as soon as possible. The number to contact patient is [phone]

## 2020-02-01 NOTE — Telephone Encounter (Signed)
Spoke with her and made her an appt for friday

## 2020-02-03 ENCOUNTER — Ambulatory Visit: Payer: BC Managed Care – PPO | Admitting: Orthopaedic Surgery

## 2020-02-05 ENCOUNTER — Ambulatory Visit: Payer: BC Managed Care – PPO | Admitting: Orthopaedic Surgery

## 2020-02-05 ENCOUNTER — Encounter: Payer: Self-pay | Admitting: Orthopaedic Surgery

## 2020-02-05 ENCOUNTER — Telehealth: Payer: Self-pay | Admitting: Orthopaedic Surgery

## 2020-02-05 ENCOUNTER — Other Ambulatory Visit: Payer: Self-pay

## 2020-02-05 VITALS — Ht 60.0 in | Wt 231.0 lb

## 2020-02-05 DIAGNOSIS — G5601 Carpal tunnel syndrome, right upper limb: Secondary | ICD-10-CM

## 2020-02-05 DIAGNOSIS — Z6841 Body Mass Index (BMI) 40.0 and over, adult: Secondary | ICD-10-CM

## 2020-02-05 DIAGNOSIS — M1711 Unilateral primary osteoarthritis, right knee: Secondary | ICD-10-CM | POA: Diagnosis not present

## 2020-02-05 DIAGNOSIS — G5602 Carpal tunnel syndrome, left upper limb: Secondary | ICD-10-CM | POA: Insufficient documentation

## 2020-02-05 MED ORDER — BUPIVACAINE HCL 0.5 % IJ SOLN
2.0000 mL | INTRAMUSCULAR | Status: AC | PRN
  Administered 2020-02-05: 2 mL via INTRA_ARTICULAR

## 2020-02-05 MED ORDER — METHYLPREDNISOLONE ACETATE 40 MG/ML IJ SUSP
40.0000 mg | INTRAMUSCULAR | Status: AC | PRN
  Administered 2020-02-05: 40 mg via INTRA_ARTICULAR

## 2020-02-05 MED ORDER — LIDOCAINE HCL 1 % IJ SOLN
2.0000 mL | INTRAMUSCULAR | Status: AC | PRN
  Administered 2020-02-05: 2 mL

## 2020-02-05 NOTE — Telephone Encounter (Signed)
Yes that's fine. permanent

## 2020-02-05 NOTE — Telephone Encounter (Signed)
Called patient. No answer. Left message for patient that this form will be up front for pick up at her convenience.

## 2020-02-05 NOTE — Progress Notes (Signed)
Office Visit Note   Patient: Natalie Greene           Date of Birth: 1971/08/27           MRN: [id number] Visit Date: 02/05/2020              Requested by: Idelle Crouch, MD [facility] [place],  [place] [address] PCP: Idelle Crouch, MD   Assessment & Plan: Visit Diagnoses:  1. Left carpal tunnel syndrome   2. Right carpal tunnel syndrome   3. Primary osteoarthritis of right knee   4. Body mass index 40.0-44.9, adult ([place])   5. Morbid obesity ([place])     Plan: My impression is advanced right knee tricompartmental DJD with effusion.  15 cc of joint fluid aspirated from the knee today and then injected with cortisone.  We again had a lengthy discussion on weight loss and how this affects knee pain.  She is currently not a surgical candidate as her BMI is 45.  She understands that this needs to be under 40 in order to proceed with a knee replacement.  We also discussed that her hip and back pain are likely related to her knee pain and her obesity.  We renewed her handicap placard today.  Her disability paperwork was filled out as well today.  In terms of her carpal tunnel syndrome I have reviewed the nerve conduction studies that she had last year and they show severe bilateral carpal tunnel syndrome.  Based on our discussion and findings she has elected to proceed with left carpal tunnel release.  Risk benefits and rehab recovery reviewed in detail today.  Follow-Up Instructions: Return if symptoms worsen or fail to improve.   Orders:  Orders Placed This Encounter  Procedures  . Large Joint Inj   No orders of the defined types were placed in this encounter.     Procedures: Large Joint Inj: R knee on 02/05/2020 1:08 PM Indications: pain Details: 22 G needle  Arthrogram: No  Medications: 40 mg methylPREDNISolone acetate 40 MG/ML; 2 mL lidocaine 1 %; 2 mL bupivacaine 0.5 % Aspirate: 15 mL clear Consent was given by the patient. Patient was  prepped and draped in the usual sterile fashion.       Clinical Data: No additional findings.   Subjective: Chief Complaint  Patient presents with  . Right Knee - Pain    Tarneshia returns today for follow-up of continued right knee pain due to severe DJD as well as right hip and low back pain.  Denies any radicular symptoms.  She takes Percocet and Celebrex on a daily basis that is prescribed by her PCP.  She feels that her right knee has an effusion again.   Review of Systems  Constitutional: Negative.   HENT: Negative.   Eyes: Negative.   Respiratory: Negative.   Cardiovascular: Negative.   Endocrine: Negative.   Musculoskeletal: Negative.   Neurological: Negative.   Hematological: Negative.   Psychiatric/Behavioral: Negative.   All other systems reviewed and are negative.    Objective: Vital Signs: Ht 5' (1.524 m)   Wt 231 lb (104.8 kg)   BMI 45.11 kg/m   Physical Exam Vitals and nursing note reviewed.  Constitutional:      Appearance: She is well-developed.  Pulmonary:     Effort: Pulmonary effort is normal.  Skin:    General: Skin is warm.     Capillary Refill: Capillary refill takes less than 2 seconds.  Neurological:     Mental Status: She is alert and oriented to person, place, and time.  Psychiatric:        Behavior: Behavior normal.        Thought Content: Thought content normal.        Judgment: Judgment normal.     Ortho Exam Right knee exam shows a small joint effusion.  Otherwise unremarkable. Specialty Comments:  No specialty comments available.  Imaging: No results found.   PMFS History: Patient Active Problem List   Diagnosis Date Noted  . Left carpal tunnel syndrome 02/05/2020  . Right carpal tunnel syndrome 02/05/2020  . Body mass index 40.0-44.9, adult ([place]) 01/08/2020  . Morbid obesity ([place]) 01/08/2020  . Primary osteoarthritis of right knee 01/07/2020  . TIA (transient ischemic attack) 05/07/2019  . Vaginitis and  vulvovaginitis 04/15/2016  . Right knee pain 03/09/2016  . GERD (gastroesophageal reflux disease) 03/09/2016  . Influenza with respiratory manifestation 02/14/2016  . H/O cold sores 06/21/2015  . Diabetes mellitus type II, controlled ([place]) 06/13/2015  . Hypokalemia 05/25/2015  . Encounter to establish care 02/07/2015  . Severe obesity (BMI >= 40) ([place]) 02/07/2015  . Generalized anxiety disorder 02/07/2015   Past Medical History:  Diagnosis Date  . Anxiety   . Cancer ([place])   . Diabetes mellitus without complication ([place])   . GERD (gastroesophageal reflux disease)   . Hypertension   . Lupus ([place])   . Squamous cell carcinoma   . Stroke [facility])    Stroke in Jan-2020 / TIA -April 2020    Family History  Problem Relation Age of Onset  . Cancer Father        colon and prostate  . Diabetes Father   . Breast cancer Other     Past Surgical History:  Procedure Laterality Date  . ABDOMINAL HYSTERECTOMY     approx 5 years ago from 2017   . CHOLECYSTECTOMY    . WISDOM TOOTH EXTRACTION     Social History   Occupational History  . Not on file  Tobacco Use  . Smoking status: Never Smoker  . Smokeless tobacco: Never Used  Substance and Sexual Activity  . Alcohol use: Yes    Alcohol/week: 0.0 standard drinks  . Drug use: No  . Sexual activity: Not Currently    Partners: Male

## 2020-02-05 NOTE — Telephone Encounter (Signed)
Pt request a handicap form renewal, she states the one she has expires on 02/14/20. Please call when ready.

## 2020-02-05 NOTE — Telephone Encounter (Signed)
Yes permanent

## 2020-02-08 ENCOUNTER — Telehealth: Payer: Self-pay | Admitting: Orthopaedic Surgery

## 2020-02-08 NOTE — Telephone Encounter (Signed)
Spoke to her and decided for oow x 3 months.

## 2020-02-08 NOTE — Telephone Encounter (Signed)
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    Would like to be OOW. On form you put  "I have  not recommended she stop working". She said this is incorrect.  Please advise.

## 2020-02-08 NOTE — Telephone Encounter (Signed)
Emailed to patient. Per patients request.

## 2020-02-08 NOTE — Telephone Encounter (Signed)
Patient called.   She was checking the status of disability paperwork she left with Erlinda Hong on Friday.   [phone]

## 2020-02-08 NOTE — Telephone Encounter (Signed)
She would like CB. She states  you guys discussed something different.

## 2020-02-08 NOTE — Telephone Encounter (Signed)
Please change it to "I have recommended that she perform sit down work only."  Thanks.

## 2020-02-12 ENCOUNTER — Telehealth: Payer: Self-pay

## 2020-02-12 NOTE — Telephone Encounter (Signed)
IC pt back left vm to return my call, Dr. Leafy Ro office sent a welcome email to pt. When referral was placed.

## 2020-02-12 NOTE — Telephone Encounter (Signed)
Patient has not heard anything from Dr Migdalia Dk office. She would like to know what is going on. Thank you.

## 2020-02-15 NOTE — Telephone Encounter (Signed)
Pt aware there was an welcome email sent to her back in dec, pt thinks it may have went to her spam folder. Pt will look into and then if there will contact Dr. Leafy Ro office.

## 2020-02-16 ENCOUNTER — Telehealth: Payer: Self-pay | Admitting: Orthopaedic Surgery

## 2020-02-16 NOTE — Telephone Encounter (Signed)
Patient called in regards to restrictions for work. Patient also has a few questions about short term disability. Patient would like a call back from Dr. Murlean Hark Patient phone number is [phone].

## 2020-02-17 NOTE — Telephone Encounter (Signed)
Patient states disability people will be sending mor paperwork for Dr Erlinda Hong to fill out.

## 2020-02-23 ENCOUNTER — Other Ambulatory Visit: Payer: Self-pay | Admitting: Internal Medicine

## 2020-02-23 DIAGNOSIS — Z1231 Encounter for screening mammogram for malignant neoplasm of breast: Secondary | ICD-10-CM

## 2020-02-24 ENCOUNTER — Telehealth: Payer: Self-pay | Admitting: Orthopaedic Surgery

## 2020-02-24 NOTE — Telephone Encounter (Signed)
Do we have more paperwork for her? Or does [employer] have anything?

## 2020-02-24 NOTE — Telephone Encounter (Signed)
Patient called.   She is following up on disability paperwork that was sent over on her behalf March 2nd. Her case worker is closing the case if they don't recieve it by Friday. \  Call back: [phone]

## 2020-02-25 ENCOUNTER — Telehealth: Payer: Self-pay | Admitting: Orthopaedic Surgery

## 2020-02-25 NOTE — Telephone Encounter (Signed)
[employer] does not have anything logged in.

## 2020-02-25 NOTE — Telephone Encounter (Signed)
Pt called in to check on disability paperwork. Pt stated the paperwork was faxed on 02-16-20 and the case closes on 02-26-20. I gave her the number for [employer] so that she could check with them and she asked I still make a note for Dr. Erlinda Hong.  [phone]

## 2020-02-25 NOTE — Telephone Encounter (Signed)
See other msg

## 2020-02-25 NOTE — Telephone Encounter (Signed)
Spoke to patient. She states  [employer] gave them a fax number and will be faxing forms over to them. I also gave her our fax number [phone]. States they faxed it also to [phone] but I did advise her we do not have it.

## 2020-02-26 NOTE — Telephone Encounter (Signed)
noted 

## 2020-03-02 ENCOUNTER — Telehealth: Payer: Self-pay | Admitting: Orthopaedic Surgery

## 2020-03-02 NOTE — Telephone Encounter (Signed)
Patient called and left message on voice mail stating [facility] wanted $1500 upfront since she has not met her deductible. She is unable to pay that at this time.  I returned patient's call and she told me the surgery is scheduled for tomorrow.  Patient is requesting to be rescheduled to a later date or at the hospital where she is able to make monthly payments.  Please call [phone]

## 2020-03-02 NOTE — Telephone Encounter (Signed)
Records 12/18/2019-present faxed to [place] for [place] [phone]

## 2020-03-03 NOTE — Telephone Encounter (Signed)
Spoke with patient yesterday and canceled surgery for [facility].  Amount to pay for [facility] is $1500 and amount for [facility] is $3100.  She is going to talk to her husband to decide how to proceed.  She will let me know when ready to reschedule at either facility.

## 2020-03-09 ENCOUNTER — Encounter: Payer: Self-pay | Admitting: Family Medicine

## 2020-03-09 ENCOUNTER — Ambulatory Visit: Payer: BC Managed Care – PPO | Admitting: Family Medicine

## 2020-03-09 ENCOUNTER — Other Ambulatory Visit: Payer: Self-pay

## 2020-03-09 DIAGNOSIS — M1711 Unilateral primary osteoarthritis, right knee: Secondary | ICD-10-CM

## 2020-03-09 NOTE — Progress Notes (Signed)
I saw and examined the patient with Dr. Maudie Mercury and agree with assessment and plan as outlined.    Recurrent right knee effusion and pain with end-stage medial compartment DJD.  Aspirated 12 cc synovial fluid and injected 6 cc 1% lido without epi and 4 cc 50% dextrose from superolateral approach.  Can repeat as needed.  She's working on weight loss so she can proceed with replacement.

## 2020-03-09 NOTE — Progress Notes (Signed)
    SUBJECTIVE:   CHIEF COMPLAINT / HPI:   Patient presenting with chronic right knee pain that has been present for over 4 years.  She is seen by Dr. Erlinda Hong for possible total knee, however, patient limited by a weight reduction goal of 25 pounds.  Patient reports that pain is worse on the medial aspect of her right knee.  She has trouble bearing weight and has pain 8 out of 10 most the time.  She has tried multiple steroid injections, the most recent of which was January 2021 and Synvisc in the summer 2020.  She reports both have resulted in limited pain relief.  PERTINENT  PMH / PSH: BMI > 40, GAD, TIA, DMII well controlled   OBJECTIVE:   There were no vitals taken for this visit.  General: Well-appearing female, no acute distress Knee:  Inspection - No gross abnormalities (skin, scars, symmetry, swelling, atrophy, hypertrophy).  Palpation - TTP of joint line (medial > lateral). No TTP of major muscle groups. Barely palpable fluid at suprapatellar pouch.  ROM - Limited knee flexion to 135 '. Full active & passive ROM.  Special Tests . No abnormal valgus/varus laxity.  ASSESSMENT/PLAN:   1. Chronic R knee pain  Patient with known severe arthritis of right knee, medial > lateral space narrowing. Not a candidate for surgical intervention at this time. Aspiration and dextrose injection today. Pt would benefit from unloading brace.   Procedure Procedure:  Ultrasound guided Knee Joint aspiration and injection- Laterality: right  Procedure, treatment alternatives, risks and benefits were explained to patient and specific risks were discussed.  Consent was given by the patient.  Using supero-lateral approach with patient's knee extended to 170 degrees, injection site was identified by ultrasound and sterilized with iodine and alcohol, treated with cold spray for ten seconds prior to injection of 5 cc 1% lidocaine without epinephrine wheal and into deep tissue of knee. Needle removed and replaced  with 18 gauge needle and aspirated 5 cc of yellow tinged fluid. Patient was then injected with 5 cc dextrose solution.  The patient tolerated the procedure well without complications. Post procedure care and return precautions provided.   2. BMI > 40 Discussed weight goals, nutrition, exercise with patient. Identified one SMART goal for exercise. Patient waiting on call from nutritionist per Dr. Randall An, MD [place]

## 2020-03-15 ENCOUNTER — Ambulatory Visit
Admission: RE | Admit: 2020-03-15 | Discharge: 2020-03-15 | Disposition: A | Discharge status: Home / Self Care | Payer: BC Managed Care – PPO | Source: Ambulatory Visit | Attending: Internal Medicine | Admitting: Internal Medicine

## 2020-03-15 ENCOUNTER — Telehealth: Payer: Self-pay | Admitting: Orthopaedic Surgery

## 2020-03-15 DIAGNOSIS — Z1231 Encounter for screening mammogram for malignant neoplasm of breast: Secondary | ICD-10-CM | POA: Insufficient documentation

## 2020-03-15 NOTE — Telephone Encounter (Signed)
Records refaxed to [employer] ([place]) [phone]. Originally faxed 3/17

## 2020-03-17 ENCOUNTER — Inpatient Hospital Stay: Payer: BC Managed Care – PPO | Admitting: Physician Assistant

## 2020-03-22 ENCOUNTER — Telehealth: Payer: Self-pay | Admitting: Orthopaedic Surgery

## 2020-03-22 NOTE — Telephone Encounter (Signed)
Records re faxed to [facility] ([phone]

## 2020-04-12 ENCOUNTER — Ambulatory Visit (INDEPENDENT_AMBULATORY_CARE_PROVIDER_SITE_OTHER): Payer: BC Managed Care – PPO | Admitting: Family Medicine

## 2020-04-12 ENCOUNTER — Encounter (INDEPENDENT_AMBULATORY_CARE_PROVIDER_SITE_OTHER): Payer: Self-pay | Admitting: Family Medicine

## 2020-04-12 ENCOUNTER — Other Ambulatory Visit: Payer: Self-pay

## 2020-04-12 VITALS — BP 123/65 | HR 66 | Temp 98.4°F | Ht 60.0 in | Wt 224.0 lb

## 2020-04-12 DIAGNOSIS — Z0289 Encounter for other administrative examinations: Secondary | ICD-10-CM

## 2020-04-12 DIAGNOSIS — I10 Essential (primary) hypertension: Secondary | ICD-10-CM

## 2020-04-12 DIAGNOSIS — Z9189 Other specified personal risk factors, not elsewhere classified: Secondary | ICD-10-CM | POA: Diagnosis not present

## 2020-04-12 DIAGNOSIS — F3289 Other specified depressive episodes: Secondary | ICD-10-CM | POA: Diagnosis not present

## 2020-04-12 DIAGNOSIS — E119 Type 2 diabetes mellitus without complications: Secondary | ICD-10-CM | POA: Diagnosis not present

## 2020-04-12 DIAGNOSIS — R0602 Shortness of breath: Secondary | ICD-10-CM

## 2020-04-12 DIAGNOSIS — E1159 Type 2 diabetes mellitus with other circulatory complications: Secondary | ICD-10-CM

## 2020-04-12 DIAGNOSIS — R5383 Other fatigue: Secondary | ICD-10-CM | POA: Diagnosis not present

## 2020-04-12 DIAGNOSIS — E1169 Type 2 diabetes mellitus with other specified complication: Secondary | ICD-10-CM

## 2020-04-12 DIAGNOSIS — R0683 Snoring: Secondary | ICD-10-CM

## 2020-04-12 DIAGNOSIS — E785 Hyperlipidemia, unspecified: Secondary | ICD-10-CM

## 2020-04-12 DIAGNOSIS — Z6841 Body Mass Index (BMI) 40.0 and over, adult: Secondary | ICD-10-CM

## 2020-04-12 DIAGNOSIS — K5909 Other constipation: Secondary | ICD-10-CM

## 2020-04-12 NOTE — Progress Notes (Signed)
Dear Dr. Georga Greene,   Thank you for referring Natalie Greene to our clinic. The following note includes my evaluation and treatment recommendations.  Chief Complaint:   Natalie Greene (MR# [id number]) is a 49 y.o. female who presents for evaluation and treatment of Natalie and related comorbidities. Current BMI is Body mass index is 43.75 kg/m. Natalie Greene has been struggling with her weight for many years and has been unsuccessful in either losing weight, maintaining weight loss, or reaching her healthy weight goal.  Natalie Greene is currently in the action stage of change and ready to dedicate time achieving and maintaining a healthier weight. Natalie Greene is interested in becoming our patient and working on intensive lifestyle modifications including (but not limited to) diet and exercise for weight loss.  Natalie Greene has joined [employer] and started swimming 3 days a week for 1 hour.  She says she has been more mobile in general.  She drinks one 16 ounce Pepsi per day and says she "loves" sweet tea.  She also drinks water with lemon.  She needs a BMI below 40 because she is thinking of getting a TKR on her right knee.  She sees Dr. Erlinda Greene at [facility]  Natalie Greene habits were reviewed today and are as follows: Her family eats meals together, her desired weight loss is 90, she has been heavy most of her life, she started gaining weight when she was 49 years old, her heaviest weight ever was 240 pounds, she is a picky eater, she craves pasta and pizza, she skips dinner sometimes, she is frequently drinking liquids with calories, she frequently makes poor food choices and she struggles with emotional eating.  Depression Screen Natalie Greene Food and Mood (modified PHQ-9) score was 10.  Depression screen [facility] 2/9 04/12/2020  Decreased Interest 3  Down, Depressed, Hopeless 2  PHQ - 2 Score 5  Altered sleeping 1  Tired, decreased energy 2  Change in appetite 1  Feeling bad or failure about yourself  0  Trouble  concentrating 1  Moving slowly or fidgety/restless 0  Suicidal thoughts 0  PHQ-9 Score 10  Difficult doing work/chores Not difficult at all   Subjective:   1. Fatigue, unspecified type Natalie Greene denies daytime somnolence and denies waking up still tired. Patent has a history of symptoms of snoring. Natalie Greene generally gets 7 hours of sleep per night, and states that she has generally restful sleep. Snoring is present. Apneic episodes are present. Epworth Sleepiness Score is 11.  2. SOB (shortness of breath) on exertion Natalie Greene notes increasing shortness of breath with exercising and seems to be worsening over time with weight gain. She notes getting out of breath sooner with activity than she used to. This has gotten worse recently. Natalie Greene denies shortness of breath at rest or orthopnea.  3. Type 2 diabetes mellitus without complication, without long-term current use of insulin (HCC) Natalie Greene is taking glipizide and recently started Victoza 1.2 mg in the morning (3rd week).  She endorses some nausea and constipation.  She does not check her blood sugars at home.  Reports decreased polyphagia.  She is down 8 pounds so far.  Lab Results  Component Value Date   HGBA1C 6.9 (H) 05/08/2019   HGBA1C 6.9 (H) 03/20/2019   HGBA1C 6.5 06/13/2015   Lab Results  Component Value Date   MICROALBUR 1.0 08/23/2015   [place] 95 05/08/2019   CREATININE 0.68 07/21/2019   4. Hypertension associated with diabetes (Natalie Greene) Review: taking medications as instructed,  no medication side effects noted, no chest pain on exertion, no dyspnea on exertion, no swelling of ankles.  She takes lisinopril for blood pressure control.   BP Readings from Last 3 Encounters:  04/12/20 123/65  07/21/19 132/84  05/08/19 124/69   5. Hyperlipidemia associated with type 2 diabetes mellitus (Natalie Greene) Thena has hyperlipidemia and has been trying to improve her cholesterol levels with intensive lifestyle modification including a low saturated  fat diet, exercise and weight loss. She denies any chest pain, claudication or myalgias.  She is taking atorvastatin.  Lab Results  Component Value Date   ALT 16 05/07/2019   AST 15 05/07/2019   ALKPHOS 59 05/07/2019   BILITOT 0.5 05/07/2019   Lab Results  Component Value Date   CHOL 172 05/08/2019   HDL 47 05/08/2019   LDLCALC 95 05/08/2019   TRIG 152 (H) 05/08/2019   CHOLHDL 3.7 05/08/2019   6. Snores Epworth score is 11 today.  She had a sleep study that was positive for OSA, but a CPAP was too expensive at that time.  Question if insurance will cover now.  Situation Chance of Dozing or Sleeping  Sitting and reading 3 = high chance of dozing or sleeping  Watching television 3 = high chance of dozing or sleeping  Sitting as a passenger in a car for an hour 3 = high chance of dozing or sleeping  Sitting inactive in a public place (theater or meeting) 0 = would never doze or sleep  Lying down in the afternoon when circumstances permit 2 = moderate chance of dozing or sleeping  Sitting and talking to someone 0 = would never doze or sleep  Sitting quietly after lunch without alcohol 0 = would never doze or sleep  In a car, while stopped for a few minutes in traffic 0 = would never doze or sleep  TOTAL 11   7. Other constipation Natalie Greene notes constipation.   8. Other depression, with emotional eating Natalie Greene is struggling with emotional eating and using food for comfort to the extent that it is negatively impacting her health. She has been working on behavior modification techniques to help reduce her emotional eating and has been unsuccessful. She shows no sign of suicidal or homicidal ideations.  PHQ-9 is 10 today.  9. At risk for hypoglycemia Natalie Greene is at increased risk for hypoglycemia due to changes in diet, diagnosis of diabetes, and/or insulin use. Natalie Greene is not currently taking insulin.   Assessment/Plan:   1. Fatigue, unspecified type Natalie Greene does feel that her weight is  causing her energy to be lower than it should be. Fatigue may be related to Natalie, depression or many other causes. Labs will be ordered, and in the meanwhile, Annam will focus on self care including making healthy food choices, increasing physical activity and focusing on stress reduction.  Orders - EKG 12-Lead - VITAMIN D 25 Hydroxy (Vit-D Deficiency, Fractures) - TSH - T4, free - T3 - Anemia panel  2. SOB (shortness of breath) on exertion Natalie Greene does feel that she gets out of breath more easily that she used to when she exercises. Natalie Greene shortness of breath appears to be Natalie related and exercise induced. She has agreed to work on weight loss and gradually increase exercise to treat her exercise induced shortness of breath. Will continue to monitor closely.  3. Type 2 diabetes mellitus without complication, without long-term current use of insulin (HCC) Good blood sugar control is important to decrease the likelihood of diabetic  complications such as nephropathy, neuropathy, limb loss, blindness, coronary artery disease, and death. Intensive lifestyle modification including diet, exercise and weight loss are the first line of treatment for diabetes.   Orders - Comprehensive metabolic panel - Hemoglobin A1c - Insulin, random  4. Hypertension associated with diabetes ([place]) Natalie Greene is working on healthy weight loss and exercise to improve blood pressure control. We will watch for signs of hypotension as she continues her lifestyle modifications.  Orders - CBC with Differential/Platelet  5. Hyperlipidemia associated with type 2 diabetes mellitus ([place]) Cardiovascular risk and specific lipid/LDL goals reviewed.  We discussed several lifestyle modifications today and [place] will continue to work on diet, exercise and weight loss efforts. Orders and follow up as documented in patient record.   Counseling Intensive lifestyle modifications are the first line treatment for this  issue. . Dietary changes: Increase soluble fiber. Decrease simple carbohydrates. . Exercise changes: Moderate to vigorous-intensity aerobic activity 150 minutes per week if tolerated. . Lipid-lowering medications: see documented in medical record.  Orders - Lipid panel  6. Snores Will continue to monitor. Insufficient sleep is associated with Natalie, DM2, CAD, HTN, and premature death. Metabolic/Endocrine manifestations include decreased glucose tolerance, decreased insulin sensitivity, increased evening cortisol, increased ghrelin, decreased leptin. Adults sleeping <= 5 h were 55% more likely to have Natalie than those sleeping >5 hours. A reduction of 1 h of sleep is associated with 0.35kg/m2 increase in BMI. - Curr Obes Rep (2012) 1:245-256  7. Other constipation Gave bowel regimen.  Advised to increase intake of water and fiber.  8. Other depression, with emotional eating Behavior modification techniques were discussed today to help Emma deal with her emotional/non-hunger eating behaviors.  Orders and follow up as documented in patient record.   9. At risk for hypoglycemia Decrease glipizide by 1/2 and hold completely if hypoglycemic.  10. Class 3 severe Natalie with serious comorbidity and body mass index (BMI) of 40.0 to 44.9 in adult, unspecified Natalie type (HCC) Natalie Greene is currently in the action stage of change and her goal is to continue with weight loss efforts. I recommend Natalie Greene begin the structured treatment plan as follows:  She has agreed to the Category 1 Plan.  Exercise goals: No exercise has been prescribed at this time.   Behavioral modification strategies: increasing lean protein intake, decreasing simple carbohydrates, increasing vegetables, increasing water intake and decreasing liquid calories.  She was informed of the importance of frequent follow-up visits to maximize her success with intensive lifestyle modifications for her multiple health conditions. She  was informed we would discuss her lab results at her next visit unless there is a critical issue that needs to be addressed sooner. Natalie Greene agreed to keep her next visit at the agreed upon time to discuss these results.  Objective:   Blood pressure 123/65, pulse 66, temperature 98.4 F (36.9 C), temperature source Oral, height 5' (1.524 m), weight 224 lb (101.6 kg), SpO2 96 %. Body mass index is 43.75 kg/m.  EKG: Normal sinus rhythm, rate 68 bpm.  Indirect Calorimeter completed today shows a VO2 of 178 and a REE of 1237.  Her calculated basal metabolic rate is [id number] thus her basal metabolic rate is worse than expected.  General: Cooperative, alert, well developed, in no acute distress. HEENT: Conjunctivae and lids unremarkable. Cardiovascular: Regular rhythm.  Lungs: Normal work of breathing. Neurologic: No focal deficits.   Lab Results  Component Value Date   CREATININE 0.68 07/21/2019   BUN 15 07/21/2019   NA  136 07/21/2019   K 4.1 07/21/2019   CL 104 07/21/2019   CO2 27 07/21/2019   Lab Results  Component Value Date   ALT 16 05/07/2019   AST 15 05/07/2019   ALKPHOS 59 05/07/2019   BILITOT 0.5 05/07/2019   Lab Results  Component Value Date   HGBA1C 6.9 (H) 05/08/2019   HGBA1C 6.9 (H) 03/20/2019   HGBA1C 6.5 06/13/2015   HGBA1C 7.3 (H) 03/07/2015   Lab Results  Component Value Date   TSH 1.806 07/21/2019   Lab Results  Component Value Date   CHOL 172 05/08/2019   HDL 47 05/08/2019   LDLCALC 95 05/08/2019   TRIG 152 (H) 05/08/2019   CHOLHDL 3.7 05/08/2019   Lab Results  Component Value Date   WBC 10.2 07/21/2019   HGB 12.7 07/21/2019   HCT 39.1 07/21/2019   MCV 86.7 07/21/2019   PLT 242 07/21/2019   Lab Results  Component Value Date   FERRITIN 27.5 08/23/2015   Attestation Statements:   This is the patient's first visit at [facility]. The patient's NEW PATIENT PACKET was reviewed at length. Included in the packet: current and past  health history, medications, allergies, ROS, gynecologic history (women only), surgical history, family history, social history, weight history, weight loss surgery history (for those that have had weight loss surgery), nutritional evaluation, mood and food questionnaire, PHQ9, Epworth questionnaire, sleep habits questionnaire, patient life and health improvement goals questionnaire. These will all be scanned into the patient's chart under media.   During the visit, I independently reviewed the patient's EKG, bioimpedance scale results, and indirect calorimeter results. I used this information to tailor a meal plan for the patient that will help her to lose weight and will improve her Natalie-related conditions going forward. I performed a medically necessary appropriate examination and/or evaluation. I discussed the assessment and treatment plan with the patient. The patient was provided an opportunity to ask questions and all were answered. The patient agreed with the plan and demonstrated an understanding of the instructions. Labs were ordered at this visit and will be reviewed at the next visit unless more critical results need to be addressed immediately. Clinical information was updated and documented in the EMR.   I, Water quality scientist, CMA, am acting as Location manager for [employer], DO.  I have reviewed the above documentation for accuracy and completeness, and I agree with the above. Briscoe Deutscher, DO

## 2020-04-13 LAB — ANEMIA PANEL
Ferritin: 27 ng/mL (ref 15–150)
Folate, Hemolysate: 472 ng/mL
Folate, RBC: 1189 ng/mL (ref >498)
Hematocrit: 39.7 % (ref 34.0–46.6)
Iron Saturation: 22 % (ref 15–55)
Iron: 68 ug/dL (ref 27–159)
Retic Ct Pct: 1.5 % (ref 0.6–2.6)
Total Iron Binding Capacity: 303 ug/dL (ref 250–450)
UIBC: 235 ug/dL (ref 131–425)
Vitamin B-12: >2000 pg/mL — ABNORMAL HIGH (ref 232–1245)

## 2020-04-13 LAB — COMPREHENSIVE METABOLIC PANEL
ALT: 14 IU/L (ref 0–32)
AST: 14 IU/L (ref 0–40)
Albumin/Globulin Ratio: 1.6 (ref 1.2–2.2)
Albumin: 4.2 g/dL (ref 3.8–4.8)
Alkaline Phosphatase: 64 IU/L (ref 39–117)
BUN/Creatinine Ratio: 21 (ref 9–23)
BUN: 15 mg/dL (ref 6–24)
Bilirubin Total: 0.4 mg/dL (ref 0.0–1.2)
CO2: 24 mmol/L (ref 20–29)
Calcium: 9.6 mg/dL (ref 8.7–10.2)
Chloride: 101 mmol/L (ref 96–106)
Creatinine, Ser: 0.72 mg/dL (ref 0.57–1.00)
GFR calc Af Amer: 115 mL/min/{1.73_m2} (ref >59)
GFR calc non Af Amer: 99 mL/min/{1.73_m2} (ref >59)
Globulin, Total: 2.6 g/dL (ref 1.5–4.5)
Glucose: 93 mg/dL (ref 65–99)
Potassium: 4.5 mmol/L (ref 3.5–5.2)
Sodium: 139 mmol/L (ref 134–144)
Total Protein: 6.8 g/dL (ref 6.0–8.5)

## 2020-04-13 LAB — CBC WITH DIFFERENTIAL/PLATELET
Basophils Absolute: 0.1 10*3/uL (ref 0.0–0.2)
Basos: 1 %
EOS (ABSOLUTE): 0.3 10*3/uL (ref 0.0–0.4)
Eos: 3 %
Hemoglobin: 13 g/dL (ref 11.1–15.9)
Immature Grans (Abs): 0 10*3/uL (ref 0.0–0.1)
Immature Granulocytes: 0 %
Lymphocytes Absolute: 1.6 10*3/uL (ref 0.7–3.1)
Lymphs: 20 %
MCH: 28.6 pg (ref 26.6–33.0)
MCHC: 32.7 g/dL (ref 31.5–35.7)
MCV: 87 fL (ref 79–97)
Monocytes Absolute: 0.5 10*3/uL (ref 0.1–0.9)
Monocytes: 7 %
Neutrophils Absolute: 5.6 10*3/uL (ref 1.4–7.0)
Neutrophils: 69 %
Platelets: 231 10*3/uL (ref 150–450)
RBC: 4.55 x10E6/uL (ref 3.77–5.28)
RDW: 13.1 % (ref 11.7–15.4)
WBC: 8.1 10*3/uL (ref 3.4–10.8)

## 2020-04-13 LAB — HEMOGLOBIN A1C
Est. average glucose Bld gHb Est-mCnc: 140 mg/dL
Hgb A1c MFr Bld: 6.5 % — ABNORMAL HIGH (ref 4.8–5.6)

## 2020-04-13 LAB — LIPID PANEL
Chol/HDL Ratio: 3.7 ratio (ref 0.0–4.4)
Cholesterol, Total: 169 mg/dL (ref 100–199)
HDL: 46 mg/dL (ref >39)
LDL Chol Calc (NIH): 92 mg/dL (ref 0–99)
Triglycerides: 183 mg/dL — ABNORMAL HIGH (ref 0–149)
VLDL Cholesterol Cal: 31 mg/dL (ref 5–40)

## 2020-04-13 LAB — T4, FREE: Free T4: 0.77 ng/dL — ABNORMAL LOW (ref 0.82–1.77)

## 2020-04-13 LAB — T3: T3, Total: 116 ng/dL (ref 71–180)

## 2020-04-13 LAB — TSH: TSH: 2.3 u[IU]/mL (ref 0.450–4.500)

## 2020-04-13 LAB — VITAMIN D 25 HYDROXY (VIT D DEFICIENCY, FRACTURES): Vit D, 25-Hydroxy: 59.2 ng/mL (ref 30.0–100.0)

## 2020-04-13 LAB — INSULIN, RANDOM: INSULIN: 13.2 u[IU]/mL (ref 2.6–24.9)

## 2020-04-26 ENCOUNTER — Encounter (INDEPENDENT_AMBULATORY_CARE_PROVIDER_SITE_OTHER): Payer: Self-pay | Admitting: Family Medicine

## 2020-04-26 ENCOUNTER — Ambulatory Visit (INDEPENDENT_AMBULATORY_CARE_PROVIDER_SITE_OTHER): Payer: BC Managed Care – PPO | Admitting: Family Medicine

## 2020-04-26 ENCOUNTER — Other Ambulatory Visit: Payer: Self-pay

## 2020-04-26 VITALS — BP 123/73 | HR 67 | Temp 98.5°F | Ht 60.0 in | Wt 222.0 lb

## 2020-04-26 DIAGNOSIS — M25561 Pain in right knee: Secondary | ICD-10-CM

## 2020-04-26 DIAGNOSIS — E781 Pure hyperglyceridemia: Secondary | ICD-10-CM

## 2020-04-26 DIAGNOSIS — Z6841 Body Mass Index (BMI) 40.0 and over, adult: Secondary | ICD-10-CM

## 2020-04-26 DIAGNOSIS — Z9189 Other specified personal risk factors, not elsewhere classified: Secondary | ICD-10-CM | POA: Diagnosis not present

## 2020-04-26 DIAGNOSIS — R790 Abnormal level of blood mineral: Secondary | ICD-10-CM

## 2020-04-26 DIAGNOSIS — G8929 Other chronic pain: Secondary | ICD-10-CM

## 2020-04-26 DIAGNOSIS — E119 Type 2 diabetes mellitus without complications: Secondary | ICD-10-CM

## 2020-04-26 NOTE — Progress Notes (Signed)
Chief Complaint:   OBESITY Natalie Greene is here to discuss her progress with her obesity treatment plan along with follow-up of her obesity related diagnoses. Natalie Greene is on the Category 1 Plan and states she is following her eating plan approximately 30% of the time. Natalie Greene states she is swimming for 45 minutes 3 times per week.  Today's visit was #: 2 Starting weight: 224 lbs Starting date: 04/12/2020 Today's weight: 222 lbs Today's date: 04/26/2020 Total lbs lost to date: 2 lbs Total lbs lost since last in-office visit: 2 lbs  Interim History: Natalie Greene is still struggling with soda.  She felt that there was too much meat on the plan and she ended up getting lean cuisine.  She endorses some polyphagia, especially at night.  Subjective:   1. Low ferritin Natalie Greene is not a vegetarian.  She does not have a history of weight loss surgery.   CBC Latest Ref Rng & Units 04/12/2020 07/21/2019 05/08/2019  WBC 3.4 - 10.8 x10E3/uL 8.1 10.2 6.6  Hemoglobin 11.1 - 15.9 g/dL 13.0 12.7 12.0  Hematocrit 34.0 - 46.6 % 39.7 39.1 36.1  Platelets 150 - 450 x10E3/uL 231 242 228   Lab Results  Component Value Date   IRON 68 04/12/2020   TIBC 303 04/12/2020   FERRITIN 27 04/12/2020   Lab Results  Component Value Date   VITAMINB12 >2000 (H) 04/12/2020   2. Type 2 diabetes mellitus without complication, without long-term current use of insulin ([place]) Natalie Greene started Victoza about 5 weeks ago.  She is currently at 1.2 mg.  Lab Results  Component Value Date   HGBA1C 6.5 (H) 04/12/2020   HGBA1C 6.9 (H) 05/08/2019   HGBA1C 6.9 (H) 03/20/2019   Lab Results  Component Value Date   MICROALBUR 1.0 08/23/2015   LDLCALC 92 04/12/2020   CREATININE 0.72 04/12/2020   Lab Results  Component Value Date   INSULIN 13.2 04/12/2020   3. Hypertriglyceridemia This has been consistent for many years.  Lab Results  Component Value Date   ALT 14 04/12/2020   AST 14 04/12/2020   ALKPHOS 64 04/12/2020   BILITOT  0.4 04/12/2020   Lab Results  Component Value Date   CHOL 169 04/12/2020   HDL 46 04/12/2020   LDLCALC 92 04/12/2020   TRIG 183 (H) 04/12/2020   CHOLHDL 3.7 04/12/2020   4. Chronic pain of right knee Natalie Greene needs a TKR.  She sees Dr. Erlinda Hong at [facility].  BMI is 43.5 today.  5. At risk for constipation Natalie Greene is at increased risk for constipation due to inadequate water intake, changes in diet, and/or use of medications such as GLP1 agonists. Natalie Greene denies hard, infrequent stools currently.   Assessment/Plan:   1. Low ferritin Recommend [place] start an OTC slow-release iron supplement.  2. Type 2 diabetes mellitus without complication, without long-term current use of insulin (HCC) Advance Victoza to 1.8 mg subcu daily.  3. Hypertriglyceridemia Lab results reviewed with patient. We will continue to monitor. Cardiovascular risk and specific lipid goals reviewed.  4. Chronic pain of right knee Will follow because mobility and pain control are important for weight management.  5. At risk for constipation Natalie Greene was given approximately 15 minutes of counseling today regarding prevention of constipation. She was encouraged to increase water and fiber intake.   6. Class 3 severe obesity with serious comorbidity and body mass index (BMI) of 40.0 to 44.9 in adult, unspecified obesity type (HCC) Natalie Greene is currently in the action stage  of change. As such, her goal is to continue with weight loss efforts. She has agreed to the Category 1 Plan.   Exercise goals: For substantial health benefits, adults should do at least 150 minutes (2 hours and 30 minutes) a week of moderate-intensity, or 75 minutes (1 hour and 15 minutes) a week of vigorous-intensity aerobic physical activity, or an equivalent combination of moderate- and vigorous-intensity aerobic activity. Aerobic activity should be performed in episodes of at least 10 minutes, and preferably, it should be spread throughout the  week.  Behavioral modification strategies: increasing lean protein intake and increasing water intake.  Natalie Greene has agreed to follow-up with our clinic in 2 weeks. She was informed of the importance of frequent follow-up visits to maximize her success with intensive lifestyle modifications for her multiple health conditions.   Objective:   Blood pressure 123/73, pulse 67, temperature 98.5 F (36.9 C), temperature source Oral, height 5' (1.524 m), weight 222 lb (100.7 kg), SpO2 95 %. Body mass index is 43.36 kg/m.  General: Cooperative, alert, well developed, in no acute distress. HEENT: Conjunctivae and lids unremarkable. Cardiovascular: Regular rhythm.  Lungs: Normal work of breathing. Neurologic: No focal deficits.   Lab Results  Component Value Date   CREATININE 0.72 04/12/2020   BUN 15 04/12/2020   NA 139 04/12/2020   K 4.5 04/12/2020   CL 101 04/12/2020   CO2 24 04/12/2020   Lab Results  Component Value Date   ALT 14 04/12/2020   AST 14 04/12/2020   ALKPHOS 64 04/12/2020   BILITOT 0.4 04/12/2020   Lab Results  Component Value Date   HGBA1C 6.5 (H) 04/12/2020   HGBA1C 6.9 (H) 05/08/2019   HGBA1C 6.9 (H) 03/20/2019   HGBA1C 6.5 06/13/2015   HGBA1C 7.3 (H) 03/07/2015   Lab Results  Component Value Date   INSULIN 13.2 04/12/2020   Lab Results  Component Value Date   TSH 2.300 04/12/2020   Lab Results  Component Value Date   CHOL 169 04/12/2020   HDL 46 04/12/2020   LDLCALC 92 04/12/2020   TRIG 183 (H) 04/12/2020   CHOLHDL 3.7 04/12/2020   Lab Results  Component Value Date   WBC 8.1 04/12/2020   HGB 13.0 04/12/2020   HCT 39.7 04/12/2020   MCV 87 04/12/2020   PLT 231 04/12/2020   Lab Results  Component Value Date   IRON 68 04/12/2020   TIBC 303 04/12/2020   FERRITIN 27 04/12/2020   Attestation Statements:   Reviewed by clinician on day of visit: allergies, medications, problem list, medical history, surgical history, family history, social  history, and previous encounter notes.  I, Water quality scientist, CMA, am acting as Location manager for [employer], DO.  I have reviewed the above documentation for accuracy and completeness, and I agree with the above. Briscoe Deutscher, DO

## 2020-05-23 ENCOUNTER — Ambulatory Visit (INDEPENDENT_AMBULATORY_CARE_PROVIDER_SITE_OTHER): Payer: BC Managed Care – PPO | Admitting: Family Medicine

## 2020-09-21 ENCOUNTER — Emergency Department: Payer: Medicaid Other

## 2020-09-21 ENCOUNTER — Other Ambulatory Visit: Payer: Self-pay

## 2020-09-21 ENCOUNTER — Emergency Department
Admission: EM | Admit: 2020-09-21 | Discharge: 2020-09-21 | Disposition: A | Discharge status: Home / Self Care | Payer: Medicaid Other | Attending: Emergency Medicine | Admitting: Emergency Medicine

## 2020-09-21 ENCOUNTER — Encounter: Payer: Self-pay | Admitting: Intensive Care

## 2020-09-21 DIAGNOSIS — Z859 Personal history of malignant neoplasm, unspecified: Secondary | ICD-10-CM | POA: Insufficient documentation

## 2020-09-21 DIAGNOSIS — I10 Essential (primary) hypertension: Secondary | ICD-10-CM | POA: Insufficient documentation

## 2020-09-21 DIAGNOSIS — R3589 Other polyuria: Secondary | ICD-10-CM | POA: Insufficient documentation

## 2020-09-21 DIAGNOSIS — Z7984 Long term (current) use of oral hypoglycemic drugs: Secondary | ICD-10-CM | POA: Insufficient documentation

## 2020-09-21 DIAGNOSIS — Z7982 Long term (current) use of aspirin: Secondary | ICD-10-CM | POA: Insufficient documentation

## 2020-09-21 DIAGNOSIS — R631 Polydipsia: Secondary | ICD-10-CM | POA: Insufficient documentation

## 2020-09-21 DIAGNOSIS — E1165 Type 2 diabetes mellitus with hyperglycemia: Secondary | ICD-10-CM | POA: Insufficient documentation

## 2020-09-21 LAB — COMPREHENSIVE METABOLIC PANEL
ALT: 24 U/L (ref 0–44)
AST: 23 U/L (ref 15–41)
Albumin: 3.6 g/dL (ref 3.5–5.0)
Alkaline Phosphatase: 53 U/L (ref 38–126)
Anion gap: 10 (ref 5–15)
BUN: 21 mg/dL — ABNORMAL HIGH (ref 6–20)
CO2: 22 mmol/L (ref 22–32)
Calcium: 8.6 mg/dL — ABNORMAL LOW (ref 8.9–10.3)
Chloride: 98 mmol/L (ref 98–111)
Creatinine, Ser: 0.83 mg/dL (ref 0.44–1.00)
GFR calc non Af Amer: >60 mL/min (ref >60)
Glucose, Bld: 652 mg/dL (ref 70–99)
Potassium: 4.5 mmol/L (ref 3.5–5.1)
Sodium: 130 mmol/L — ABNORMAL LOW (ref 135–145)
Total Bilirubin: 0.9 mg/dL (ref 0.3–1.2)
Total Protein: 6.8 g/dL (ref 6.5–8.1)

## 2020-09-21 LAB — CBC
HCT: 35.6 % — ABNORMAL LOW (ref 36.0–46.0)
Hemoglobin: 12.4 g/dL (ref 12.0–15.0)
MCH: 28.8 pg (ref 26.0–34.0)
MCHC: 34.8 g/dL (ref 30.0–36.0)
MCV: 82.8 fL (ref 80.0–100.0)
Platelets: 249 10*3/uL (ref 150–400)
RBC: 4.3 MIL/uL (ref 3.87–5.11)
RDW: 13.1 % (ref 11.5–15.5)
WBC: 9.6 10*3/uL (ref 4.0–10.5)
nRBC: 0 % (ref 0.0–0.2)

## 2020-09-21 LAB — DIFFERENTIAL
Abs Immature Granulocytes: 0.05 10*3/uL (ref 0.00–0.07)
Basophils Absolute: 0 10*3/uL (ref 0.0–0.1)
Basophils Relative: 0 %
Eosinophils Absolute: 0.1 10*3/uL (ref 0.0–0.5)
Eosinophils Relative: 1 %
Immature Granulocytes: 1 %
Lymphocytes Relative: 9 %
Lymphs Abs: 0.9 10*3/uL (ref 0.7–4.0)
Monocytes Absolute: 0.3 10*3/uL (ref 0.1–1.0)
Monocytes Relative: 4 %
Neutro Abs: 8.2 10*3/uL — ABNORMAL HIGH (ref 1.7–7.7)
Neutrophils Relative %: 85 %

## 2020-09-21 LAB — GLUCOSE, CAPILLARY
Glucose-Capillary: >600 mg/dL (ref 70–99)
Glucose-Capillary: 599 mg/dL (ref 70–99)
Glucose-Capillary: 525 mg/dL (ref 70–99)
Glucose-Capillary: 367 mg/dL — ABNORMAL HIGH (ref 70–99)
Glucose-Capillary: 302 mg/dL — ABNORMAL HIGH (ref 70–99)

## 2020-09-21 LAB — PROTIME-INR
INR: 1 (ref 0.8–1.2)
Prothrombin Time: 12.3 seconds (ref 11.4–15.2)

## 2020-09-21 LAB — APTT: aPTT: <24 seconds — ABNORMAL LOW (ref 24–36)

## 2020-09-21 MED ORDER — INSULIN ASPART 100 UNIT/ML ~~LOC~~ SOLN
10.0000 [IU] | Freq: Once | SUBCUTANEOUS | Status: AC
  Administered 2020-09-21: 10 [IU] via INTRAVENOUS
  Filled 2020-09-21: qty 1

## 2020-09-21 MED ORDER — PANTOPRAZOLE SODIUM 40 MG IV SOLR
40.0000 mg | Freq: Once | INTRAVENOUS | Status: AC
  Administered 2020-09-21: 40 mg via INTRAVENOUS
  Filled 2020-09-21: qty 40

## 2020-09-21 MED ORDER — ONDANSETRON HCL 4 MG/2ML IJ SOLN
4.0000 mg | Freq: Once | INTRAMUSCULAR | Status: AC
  Administered 2020-09-21: 4 mg via INTRAVENOUS
  Filled 2020-09-21: qty 2

## 2020-09-21 MED ORDER — INSULIN ASPART 100 UNIT/ML ~~LOC~~ SOLN
8.0000 [IU] | Freq: Once | SUBCUTANEOUS | Status: AC
  Administered 2020-09-21: 8 [IU] via INTRAVENOUS
  Filled 2020-09-21: qty 1

## 2020-09-21 MED ORDER — SODIUM CHLORIDE 0.9 % IV BOLUS
1000.0000 mL | Freq: Once | INTRAVENOUS | Status: AC
  Administered 2020-09-21: 1000 mL via INTRAVENOUS

## 2020-09-21 MED ORDER — INSULIN GLARGINE 100 UNIT/ML ~~LOC~~ SOLN
20.0000 [IU] | Freq: Once | SUBCUTANEOUS | Status: DC
  Filled 2020-09-21: qty 0.2

## 2020-09-21 NOTE — ED Provider Notes (Signed)
[facility] Emergency Department Provider Note  ____________________________________________  Time seen: Approximately 8:50 PM  I have reviewed the triage vital signs and the nursing notes.   HISTORY  Chief Complaint Blurred Vision and Shortness of Breath    HPI Natalie Greene is a 49 y.o. female with a history of hypertension diabetes and morbid obesity who comes the ED complaining of blurry vision, gradual onset and worsening for the past few days, associated with polyuria and polydipsia.  She had previously been controlled by oral medication only for her diabetes, but was just prescribed Lantus 15 units nightly by her doctor which she has not yet started.  Symptoms are constant without aggravating or alleviating factors.  No significant pain.  She notes that she has been having some subacute symptoms ever since being diagnosed with Covid 3 weeks ago including shortness of breath, loss of appetite, fatigue.  The symptoms have not changed in the past 3 weeks.      Past Medical History:  Diagnosis Date  . Anemia   . Anxiety   . Cancer ([place])   . Constipation   . Depression   . Diabetes mellitus without complication ([place])   . Edema, lower extremity   . GERD (gastroesophageal reflux disease)   . Hypertension   . Joint pain   . Lactose intolerance   . Lupus ([place])   . Osteoarthritis   . Sleep apnea   . Squamous cell carcinoma   . Stroke [facility])    Stroke in Jan-2020 / TIA -April 2020     Patient Active Problem List   Diagnosis Date Noted  . Left carpal tunnel syndrome 02/05/2020  . Right carpal tunnel syndrome 02/05/2020  . Body mass index 40.0-44.9, adult ([place]) 01/08/2020  . Morbid obesity ([place]) 01/08/2020  . Primary osteoarthritis of right knee 01/07/2020  . TIA (transient ischemic attack) 05/07/2019  . Vaginitis and vulvovaginitis 04/15/2016  . Right knee pain 03/09/2016  . GERD (gastroesophageal reflux disease) 03/09/2016  . Influenza with  respiratory manifestation 02/14/2016  . H/O cold sores 06/21/2015  . Diabetes mellitus type II, controlled ([place]) 06/13/2015  . Hypokalemia 05/25/2015  . Encounter to establish care 02/07/2015  . Severe obesity (BMI >= 40) ([place]) 02/07/2015  . Generalized anxiety disorder 02/07/2015     Past Surgical History:  Procedure Laterality Date  . ABDOMINAL HYSTERECTOMY     approx 5 years ago from 2017   . CHOLECYSTECTOMY    . WISDOM TOOTH EXTRACTION       Prior to Admission medications   Medication Sig Start Date End Date Taking? Authorizing Provider  ALPRAZolam (XANAX) 1 MG tablet TAKE ONE TABLET BY MOUTH AT BEDTIME AS NEEDED FOR ANXIETY Patient taking differently: Take 1 mg by mouth at bedtime as needed for sleep.  10/10/16   Leone Haven, MD  Ascorbic Acid (VITAMIN C PO) Take 1 tablet by mouth daily.    [provider]  aspirin 81 MG chewable tablet Chew 81 mg by mouth daily.    [provider]  atorvastatin (LIPITOR) 20 MG tablet Take 1 tablet (20 mg total) by mouth daily. 03/20/19 04/12/20  Carrie Mew, MD  celecoxib (CELEBREX) 200 MG capsule Take 200 mg by mouth daily.    [provider]  Diclofenac Sodium (PENNSAID) 2 % SOLN Apply 2 g topically 2 (two) times daily as needed (to affected area). 01/08/20   Leandrew Koyanagi, MD  escitalopram (LEXAPRO) 10 MG tablet Take 10 mg by mouth  daily. 05/02/19   [provider]  esomeprazole (NEXIUM) 40 MG capsule Take 1 capsule (40 mg total) by mouth daily at 12 noon. 09/04/16   Leone Haven, MD  furosemide (LASIX) 20 MG tablet Take 20 mg by mouth daily.     [provider]  liraglutide (VICTOZA) 18 MG/3ML SOPN Inject 1.2 mg into the skin daily.    [provider]  lisinopril (PRINIVIL,ZESTRIL) 5 MG tablet Take 1 tablet (5 mg total) by mouth daily. 03/20/19 03/19/20  Carrie Mew, MD  oxyCODONE-acetaminophen (PERCOCET) 10-325 MG tablet Take 1 tablet by mouth every 4 (four) hours as  needed for pain.    [provider]  valACYclovir (VALTREX) 500 MG tablet TAKE 1 TABLET (500 MG TOTAL) BY MOUTH 2 (TWO) TIMES DAILY. Patient taking differently: Take 500 mg by mouth 2 (two) times daily as needed (for fever blisters).  10/17/16   Leone Haven, MD  VITAMIN E PO Take 1 capsule by mouth daily.    [provider]     Allergies Wellbutrin [bupropion]   Family History  Problem Relation Age of Onset  . Cancer Father        colon and prostate  . Diabetes Father   . Breast cancer Other   . Depression Mother   . Anxiety disorder Mother     Social History Social History   Tobacco Use  . Smoking status: Never Smoker  . Smokeless tobacco: Never Used  Vaping Use  . Vaping Use: Never used  Substance Use Topics  . Alcohol use: Yes    Alcohol/week: 0.0 standard drinks  . Drug use: No    Review of Systems  Constitutional:   No fever or chills.  ENT:   No sore throat. No rhinorrhea. Cardiovascular:   No chest pain or syncope. Respiratory:   No dyspnea or cough. Gastrointestinal:   Negative for abdominal pain, vomiting and diarrhea.  Musculoskeletal:   Negative for focal pain or swelling All other systems reviewed and are negative except as documented above in ROS and HPI.  ____________________________________________   PHYSICAL EXAM:  VITAL SIGNS: ED Triage Vitals  Enc Vitals Group     BP 09/21/20 1329 126/76     Pulse Rate 09/21/20 1329 80     Resp 09/21/20 1329 18     Temp 09/21/20 1329 99 F (37.2 C)     Temp Source 09/21/20 1329 Oral     SpO2 09/21/20 1329 95 %     Weight 09/21/20 1325 217 lb (98.4 kg)     Height 09/21/20 1325 5\' 1"  (1.549 m)     Head Circumference --      Peak Flow --      Pain Score 09/21/20 1325 6     Pain Loc --      Pain Edu? --      Excl. in [place]? --     Vital signs reviewed, nursing assessments reviewed.   Constitutional:   Alert and oriented. Non-toxic appearance. Eyes:   Conjunctivae are normal.  EOMI. PERRL. ENT      Head:   Normocephalic and atraumatic.      Nose:   Wearing a mask.      Mouth/Throat:   Wearing a mask.      Neck:   No meningismus. Full ROM. Hematological/Lymphatic/Immunilogical:   No cervical lymphadenopathy. Cardiovascular:   RRR. Symmetric bilateral radial and DP pulses.  No murmurs. Cap refill less than 2 seconds. Respiratory:   Normal  respiratory effort without tachypnea/retractions. Breath sounds are clear and equal bilaterally. No wheezes/rales/rhonchi. Gastrointestinal:   Soft and nontender. Non distended. There is no CVA tenderness.  No rebound, rigidity, or guarding.  Musculoskeletal:   Normal range of motion in all extremities. No joint effusions.  No lower extremity tenderness.  No edema. Neurologic:   Normal speech and language.  Motor grossly intact. No acute focal neurologic deficits are appreciated.  Skin:    Skin is warm, dry and intact. No rash noted.  No petechiae, purpura, or bullae.  ____________________________________________    LABS (pertinent positives/negatives) (all labs ordered are listed, but only abnormal results are displayed) Labs Reviewed  APTT - Abnormal; Notable for the following components:      Result Value   aPTT <24 (*)    All other components within normal limits  CBC - Abnormal; Notable for the following components:   HCT 35.6 (*)    All other components within normal limits  DIFFERENTIAL - Abnormal; Notable for the following components:   Neutro Abs 8.2 (*)    All other components within normal limits  COMPREHENSIVE METABOLIC PANEL - Abnormal; Notable for the following components:   Sodium 130 (*)    Glucose, Bld 652 (*)    BUN 21 (*)    Calcium 8.6 (*)    All other components within normal limits  GLUCOSE, CAPILLARY - Abnormal; Notable for the following components:   Glucose-Capillary >600 (*)    All other components within normal limits  GLUCOSE, CAPILLARY - Abnormal; Notable for the following components:    Glucose-Capillary 599 (*)    All other components within normal limits  GLUCOSE, CAPILLARY - Abnormal; Notable for the following components:   Glucose-Capillary 525 (*)    All other components within normal limits  GLUCOSE, CAPILLARY - Abnormal; Notable for the following components:   Glucose-Capillary 367 (*)    All other components within normal limits  GLUCOSE, CAPILLARY - Abnormal; Notable for the following components:   Glucose-Capillary 302 (*)    All other components within normal limits  PROTIME-INR  CBG MONITORING, ED   ____________________________________________   EKG  Interpreted by me Normal sinus rhythm rate of 75, left axis, normal intervals, poor R wave progression.  Normal ST segments and T waves.  ____________________________________________    RADIOLOGY  CT HEAD WO CONTRAST  Result Date: 09/21/2020 CLINICAL DATA:  Blurred vision EXAM: CT HEAD WITHOUT CONTRAST TECHNIQUE: Contiguous axial images were obtained from the base of the skull through the vertex without intravenous contrast. COMPARISON:  May 07, 2019 head CT and brain MRI FINDINGS: Brain: There is slight frontal atrophy bilaterally. Ventricles and sulci elsewhere appear normal. There is no intracranial mass, hemorrhage, extra-axial fluid collection, or midline shift. Brain parenchyma appears unremarkable on noncontrast CT examination. No acute infarct evident. Vascular: No hyperdense vessel. No appreciable vascular calcification. Skull: No hyperdense vessels. Sinuses/Orbits: There is mild opacification in anterior left ethmoid air cell. Other visualized paranasal sinuses are clear. Orbits appear symmetric bilaterally. Other: Mastoid air cells are clear. IMPRESSION: Slight frontal atrophy. Brain parenchyma appears unremarkable. No acute infarct. No mass or hemorrhage. Opacification noted in anterior left ethmoid air cell. Electronically Signed   By: Lowella Grip III M.D.   On: 09/21/2020 14:44     ____________________________________________   PROCEDURES Procedures  ____________________________________________  DIFFERENTIAL DIAGNOSIS   Hyperglycemia, electrolyte abnormality, DKA, dehydration  CLINICAL IMPRESSION / ASSESSMENT AND PLAN / ED COURSE  Medications ordered in the ED: Medications  insulin  glargine (LANTUS) injection 20 Units (has no administration in time range)  sodium chloride 0.9 % bolus 1,000 mL (0 mLs Intravenous Stopped 09/21/20 1530)  sodium chloride 0.9 % bolus 1,000 mL (0 mLs Intravenous Stopped 09/21/20 1735)  insulin aspart (novoLOG) injection 10 Units (10 Units Intravenous Given 09/21/20 1843)  pantoprazole (PROTONIX) injection 40 mg (40 mg Intravenous Given 09/21/20 1844)  ondansetron (ZOFRAN) injection 4 mg (4 mg Intravenous Given 09/21/20 1844)  insulin aspart (novoLOG) injection 8 Units (8 Units Intravenous Given 09/21/20 1956)    Pertinent labs & imaging results that were available during my care of the patient were reviewed by me and considered in my medical decision making (see chart for details).  Natalie Greene was evaluated in Emergency Department on 09/21/2020 for the symptoms described in the history of present illness. She was evaluated in the context of the global COVID-19 pandemic, which necessitated consideration that the patient might be at risk for infection with the SARS-CoV-2 virus that causes COVID-19. Institutional protocols and algorithms that pertain to the evaluation of patients at risk for COVID-19 are in a state of rapid change based on information released by regulatory bodies including the CDC and federal and state organizations. These policies and algorithms were followed during the patient's care in the ED.   Patient presents with hyperglycemia with blurry vision.  Doubt stroke, meningitis encephalitis or intracranial hemorrhage.  Most likely just related to osmolar effects of hyperglycemia.  Patient given 2 L of IV fluids from  triage, improved her blood sugar from 600-5 25.  Patient given 10 units of IV aspart in the ED for glycemic control.  Labs are unremarkable, no evidence of metabolic acidosis, vital signs are normal, exam is benign and reassuring.  Clinical Course as of Sep 22 2131  Wed Sep 21, 2020  2000 Blood sugar down to 360 10:07 units of IV aspart.  Will give an additional 8 units.   [PS]    Clinical Course User Index [PS] Carrie Mew, MD     ----------------------------------------- 9:32 PM on 09/21/2020 -----------------------------------------  Blood sugar now 300.  She feels much better and is eager to be discharged.  I will give her her nightly dose of Lantus, confirmed in care everywhere that it is 20 units, and then discharge.  She plans to go to the pharmacy to pick up supplies for home glucose monitoring and her Lantus prescription and follow-up with PCP.  ____________________________________________   FINAL CLINICAL IMPRESSION(S) / ED DIAGNOSES    Final diagnoses:  Type 2 diabetes mellitus with hyperglycemia, without long-term current use of insulin [facility])     ED Discharge Orders    None      Portions of this note were generated with dragon dictation software. Dictation errors may occur despite best attempts at proofreading.   Carrie Mew, MD 09/21/20 2133

## 2020-09-21 NOTE — Discharge Instructions (Addendum)
Drink lots of water to stay hydrated, and be sure to pick up your new Lantus insulin prescription to start taking tomorrow night as prescribed. Continue monitoring your blood sugar at home and follow up with your doctor to ensure your diabetes is adequately controlled.

## 2020-09-21 NOTE — ED Notes (Signed)
Pt states that her MD started her on lantus 15u but she hasn't had the rx filled yet.

## 2020-09-21 NOTE — ED Notes (Signed)
Pt CBG reading "hi" on glucometer. Jinny Blossom, RN made aware.

## 2020-09-21 NOTE — ED Notes (Signed)
This RN spoke with [place] regarding repeat CBG 525, per Dr. Joni Fears, no new orders at this time. Pt continues to sit in subwait at this time awaiting a room.

## 2020-09-21 NOTE — ED Triage Notes (Signed)
Patient c/o blurred vision that started a few days ago and worsening peripheral vision. Speech clear. No facial droop noted per 2 RNs examining at this time. No weakness. HX stroke in past per patient.

## 2020-12-21 ENCOUNTER — Emergency Department (HOSPITAL_COMMUNITY): Payer: Self-pay

## 2020-12-21 ENCOUNTER — Encounter (HOSPITAL_COMMUNITY): Payer: Self-pay

## 2020-12-21 ENCOUNTER — Emergency Department (HOSPITAL_COMMUNITY)
Admission: EM | Admit: 2020-12-21 | Discharge: 2020-12-21 | Disposition: A | Discharge status: Home / Self Care | Payer: Self-pay | Attending: Emergency Medicine | Admitting: Emergency Medicine

## 2020-12-21 DIAGNOSIS — R531 Weakness: Secondary | ICD-10-CM | POA: Insufficient documentation

## 2020-12-21 DIAGNOSIS — Z7982 Long term (current) use of aspirin: Secondary | ICD-10-CM | POA: Insufficient documentation

## 2020-12-21 DIAGNOSIS — E119 Type 2 diabetes mellitus without complications: Secondary | ICD-10-CM | POA: Insufficient documentation

## 2020-12-21 DIAGNOSIS — R29810 Facial weakness: Secondary | ICD-10-CM | POA: Insufficient documentation

## 2020-12-21 DIAGNOSIS — Z85828 Personal history of other malignant neoplasm of skin: Secondary | ICD-10-CM | POA: Insufficient documentation

## 2020-12-21 DIAGNOSIS — R2990 Unspecified symptoms and signs involving the nervous system: Secondary | ICD-10-CM

## 2020-12-21 DIAGNOSIS — G43409 Hemiplegic migraine, not intractable, without status migrainosus: Secondary | ICD-10-CM

## 2020-12-21 DIAGNOSIS — I10 Essential (primary) hypertension: Secondary | ICD-10-CM | POA: Insufficient documentation

## 2020-12-21 DIAGNOSIS — Z79899 Other long term (current) drug therapy: Secondary | ICD-10-CM | POA: Insufficient documentation

## 2020-12-21 LAB — DIFFERENTIAL
Abs Immature Granulocytes: 0.02 10*3/uL (ref 0.00–0.07)
Basophils Absolute: 0.1 10*3/uL (ref 0.0–0.1)
Basophils Relative: 1 %
Eosinophils Absolute: 0.2 10*3/uL (ref 0.0–0.5)
Eosinophils Relative: 2 %
Immature Granulocytes: 0 %
Lymphocytes Relative: 24 %
Lymphs Abs: 2 10*3/uL (ref 0.7–4.0)
Monocytes Absolute: 0.6 10*3/uL (ref 0.1–1.0)
Monocytes Relative: 7 %
Neutro Abs: 5.5 10*3/uL (ref 1.7–7.7)
Neutrophils Relative %: 66 %

## 2020-12-21 LAB — CBC
HCT: 40.8 % (ref 36.0–46.0)
Hemoglobin: 12.7 g/dL (ref 12.0–15.0)
MCH: 27 pg (ref 26.0–34.0)
MCHC: 31.1 g/dL (ref 30.0–36.0)
MCV: 86.8 fL (ref 80.0–100.0)
Platelets: 243 10*3/uL (ref 150–400)
RBC: 4.7 MIL/uL (ref 3.87–5.11)
RDW: 12.8 % (ref 11.5–15.5)
WBC: 8.3 10*3/uL (ref 4.0–10.5)
nRBC: 0 % (ref 0.0–0.2)

## 2020-12-21 LAB — COMPREHENSIVE METABOLIC PANEL
ALT: 16 U/L (ref 0–44)
AST: 16 U/L (ref 15–41)
Albumin: 3.7 g/dL (ref 3.5–5.0)
Alkaline Phosphatase: 58 U/L (ref 38–126)
Anion gap: 10 (ref 5–15)
BUN: 14 mg/dL (ref 6–20)
CO2: 25 mmol/L (ref 22–32)
Calcium: 9.2 mg/dL (ref 8.9–10.3)
Chloride: 100 mmol/L (ref 98–111)
Creatinine, Ser: 0.71 mg/dL (ref 0.44–1.00)
GFR, Estimated: >60 mL/min (ref >60)
Glucose, Bld: 257 mg/dL — ABNORMAL HIGH (ref 70–99)
Potassium: 4.1 mmol/L (ref 3.5–5.1)
Sodium: 135 mmol/L (ref 135–145)
Total Bilirubin: 0.5 mg/dL (ref 0.3–1.2)
Total Protein: 7.1 g/dL (ref 6.5–8.1)

## 2020-12-21 LAB — I-STAT CHEM 8, ED
BUN: 16 mg/dL (ref 6–20)
Calcium, Ion: 1.13 mmol/L — ABNORMAL LOW (ref 1.15–1.40)
Chloride: 99 mmol/L (ref 98–111)
Creatinine, Ser: 0.6 mg/dL (ref 0.44–1.00)
Glucose, Bld: 253 mg/dL — ABNORMAL HIGH (ref 70–99)
HCT: 39 % (ref 36.0–46.0)
Hemoglobin: 13.3 g/dL (ref 12.0–15.0)
Potassium: 4 mmol/L (ref 3.5–5.1)
Sodium: 137 mmol/L (ref 135–145)
TCO2: 27 mmol/L (ref 22–32)

## 2020-12-21 LAB — PROTIME-INR
INR: 0.9 (ref 0.8–1.2)
Prothrombin Time: 12.1 seconds (ref 11.4–15.2)

## 2020-12-21 LAB — I-STAT BETA HCG BLOOD, ED (MC, WL, AP ONLY): I-stat hCG, quantitative: <5 m[IU]/mL (ref <5)

## 2020-12-21 LAB — APTT: aPTT: 27 seconds (ref 24–36)

## 2020-12-21 MED ORDER — DIPHENHYDRAMINE HCL 50 MG/ML IJ SOLN
25.0000 mg | Freq: Once | INTRAMUSCULAR | Status: AC
  Administered 2020-12-21: 25 mg via INTRAVENOUS
  Filled 2020-12-21: qty 1

## 2020-12-21 MED ORDER — MAGNESIUM SULFATE 2 GM/50ML IV SOLN
2.0000 g | Freq: Once | INTRAVENOUS | Status: AC
  Administered 2020-12-21: 2 g via INTRAVENOUS
  Filled 2020-12-21: qty 50

## 2020-12-21 MED ORDER — SODIUM CHLORIDE 0.9% FLUSH
3.0000 mL | Freq: Once | INTRAVENOUS | Status: AC
  Administered 2020-12-21: 3 mL via INTRAVENOUS

## 2020-12-21 MED ORDER — ASPIRIN 81 MG PO CHEW: 81.0000 mg | CHEWABLE_TABLET | Freq: Every day | ORAL | Status: DC

## 2020-12-21 MED ORDER — KETOROLAC TROMETHAMINE 15 MG/ML IJ SOLN
15.0000 mg | Freq: Once | INTRAMUSCULAR | Status: AC
  Administered 2020-12-21: 15 mg via INTRAVENOUS
  Filled 2020-12-21: qty 1

## 2020-12-21 NOTE — ED Provider Notes (Signed)
[place] EMERGENCY DEPARTMENT Provider Note   CSN: [id number] Arrival date & time: 12/21/20  1731  An emergency department physician performed an initial assessment on this suspected stroke patient at 1734.  History Chief Complaint  Patient presents with  . Stroke Symptoms    Natalie Greene is a 50 y.o. female with a past medical history significant for anxiety, depression, diabetes, GERD, hypertension, lupus, obstructive sleep apnea, history of CVA in 12/2018 and TIA in 03/2019 who presents to the ED due to stroke-like symptoms.  Patient admits to vision loss in left eye, left facial droop, and left upper extremity weakness.  Last known well 1620.  Patient also states she had difficulty speaking in which she knew what to say, but couldn't say the correct word.  Patient had a stroke roughly 1 year ago and was given TPA.  She is currently on ASA 39m, but not other blood thinners. She admits to right sided occipital pressure. During my initial evaluation after CT scan, patient states her symptoms are improving. Vision returned and speech is back to normal; however, still admits to left arm weakness. No LOC.   History obtained from patient and past medical records. No interpreter used during encounter.      Past Medical History:  Diagnosis Date  . Anemia   . Anxiety   . Cancer ([place]   . Constipation   . Depression   . Diabetes mellitus without complication ([place]   . Edema, lower extremity   . GERD (gastroesophageal reflux disease)   . Hypertension   . Joint pain   . Lactose intolerance   . Lupus ([place]   . Osteoarthritis   . Sleep apnea   . Squamous cell carcinoma   . Stroke ([facility]    Stroke in Jan-2020 / TIA -April 2020    Patient Active Problem List   Diagnosis Date Noted  . Left carpal tunnel syndrome 02/05/2020  . Right carpal tunnel syndrome 02/05/2020  . Body mass index 40.0-44.9, adult ([place] 01/08/2020  . Morbid obesity ([place] 01/08/2020  . Primary  osteoarthritis of right knee 01/07/2020  . TIA (transient ischemic attack) 05/07/2019  . Vaginitis and vulvovaginitis 04/15/2016  . Right knee pain 03/09/2016  . GERD (gastroesophageal reflux disease) 03/09/2016  . Influenza with respiratory manifestation 02/14/2016  . H/O cold sores 06/21/2015  . Diabetes mellitus type II, controlled ([place] 06/13/2015  . Hypokalemia 05/25/2015  . Encounter to establish care 02/07/2015  . Severe obesity (BMI >= 40) ([facility] 02/07/2015  . Generalized anxiety disorder 02/07/2015    Past Surgical History:  Procedure Laterality Date  . ABDOMINAL HYSTERECTOMY     approx 5 years ago from 2017   . CHOLECYSTECTOMY    . WISDOM TOOTH EXTRACTION       OB History    Gravida  3   Para      Term      Preterm      AB      Living        SAB      IAB      Ectopic      Multiple      Live Births              Family History  Problem Relation Age of Onset  . Cancer Father        colon and prostate  . Diabetes Father   . Breast cancer Other   . Depression Mother   . Anxiety  disorder Mother     Social History   Tobacco Use  . Smoking status: Never Smoker  . Smokeless tobacco: Never Used  Vaping Use  . Vaping Use: Never used  Substance Use Topics  . Alcohol use: Yes    Alcohol/week: 0.0 standard drinks  . Drug use: No    Home Medications Prior to Admission medications   Medication Sig Start Date End Date Taking? Authorizing Provider  ALPRAZolam (XANAX) 1 MG tablet TAKE ONE TABLET BY MOUTH AT BEDTIME AS NEEDED FOR ANXIETY Patient taking differently: Take 1 mg by mouth at bedtime as needed for sleep.  10/10/16   Leone Haven, MD  Ascorbic Acid (VITAMIN C PO) Take 1 tablet by mouth daily.    [provider]  aspirin 81 MG chewable tablet Chew 81 mg by mouth daily.    [provider]  atorvastatin (LIPITOR) 20 MG tablet Take 1 tablet (20 mg total) by mouth daily. 03/20/19 04/12/20  Carrie Mew, MD   celecoxib (CELEBREX) 200 MG capsule Take 200 mg by mouth daily.    [provider]  Diclofenac Sodium (PENNSAID) 2 % SOLN Apply 2 g topically 2 (two) times daily as needed (to affected area). 01/08/20   Leandrew Koyanagi, MD  escitalopram (LEXAPRO) 10 MG tablet Take 10 mg by mouth daily. 05/02/19   [provider]  esomeprazole (NEXIUM) 40 MG capsule Take 1 capsule (40 mg total) by mouth daily at 12 noon. 09/04/16   Leone Haven, MD  furosemide (LASIX) 20 MG tablet Take 20 mg by mouth daily.     [provider]  liraglutide (VICTOZA) 18 MG/3ML SOPN Inject 1.2 mg into the skin daily.    [provider]  lisinopril (PRINIVIL,ZESTRIL) 5 MG tablet Take 1 tablet (5 mg total) by mouth daily. 03/20/19 03/19/20  Carrie Mew, MD  oxyCODONE-acetaminophen (PERCOCET) 10-325 MG tablet Take 1 tablet by mouth every 4 (four) hours as needed for pain.    [provider]  valACYclovir (VALTREX) 500 MG tablet TAKE 1 TABLET (500 MG TOTAL) BY MOUTH 2 (TWO) TIMES DAILY. Patient taking differently: Take 500 mg by mouth 2 (two) times daily as needed (for fever blisters).  10/17/16   Leone Haven, MD  VITAMIN E PO Take 1 capsule by mouth daily.    [provider]    Allergies    Wellbutrin [bupropion]  Review of Systems   Review of Systems  Constitutional: Negative for chills and fever.  Respiratory: Negative for shortness of breath.   Cardiovascular: Negative for chest pain.  Gastrointestinal: Negative for abdominal pain, diarrhea, nausea and vomiting.  Neurological: Positive for facial asymmetry, speech difficulty and weakness (left sided). Negative for numbness and headaches.  All other systems reviewed and are negative.   Physical Exam Updated Vital Signs BP 129/76   Pulse 63   Temp 98.3 F (36.8 C) (Oral)   Resp 15   Wt 107.2 kg   SpO2 96%   BMI 44.65 kg/m   Physical Exam Vitals and nursing note reviewed.  Constitutional:       General: She is not in acute distress.    Appearance: She is not toxic-appearing.  HENT:     Head: Normocephalic.  Eyes:     Pupils: Pupils are equal, round, and reactive to light.  Cardiovascular:     Rate and Rhythm: Normal rate and regular rhythm.     Pulses: Normal pulses.     Heart sounds: Normal heart sounds.  No murmur heard. No friction rub. No gallop.   Pulmonary:     Effort: Pulmonary effort is normal.     Breath sounds: Normal breath sounds.  Abdominal:     General: Abdomen is flat. There is no distension.     Palpations: Abdomen is soft.     Tenderness: There is no abdominal tenderness. There is no guarding or rebound.  Musculoskeletal:     Cervical back: Neck supple.     Comments: Decreased strength of LUE  Skin:    General: Skin is warm and dry.  Neurological:     General: No focal deficit present.     Mental Status: She is alert.     Comments: Speech is clear, able to follow commands CN III-XII intact No facial droop 4+/5 LUE; 5/5 all other extremities Decreased grip strength on left Sensation grossly intact throughout Moves extremities without ataxia, coordination intact Mild drift of LUE   Psychiatric:        Mood and Affect: Mood normal.        Behavior: Behavior normal.     ED Results / Procedures / Treatments   Labs (all labs ordered are listed, but only abnormal results are displayed) Labs Reviewed  COMPREHENSIVE METABOLIC PANEL - Abnormal; Notable for the following components:      Result Value   Glucose, Bld 257 (*)    All other components within normal limits  I-STAT CHEM 8, ED - Abnormal; Notable for the following components:   Glucose, Bld 253 (*)    Calcium, Ion 1.13 (*)    All other components within normal limits  PROTIME-INR  APTT  CBC  DIFFERENTIAL  I-STAT BETA HCG BLOOD, ED ([facility], [facility], [facility] ONLY)  CBG MONITORING, ED    EKG None  Radiology MR BRAIN WO CONTRAST  Result Date: 12/21/2020 CLINICAL DATA:  Initial evaluation for  acute neuro deficit, stroke suspected. EXAM: MRI HEAD WITHOUT CONTRAST TECHNIQUE: Multiplanar, multiecho pulse sequences of the brain and surrounding structures were obtained without intravenous contrast. COMPARISON:  Prior head CT from earlier the same day as well as previous MRI from 05/07/2019. FINDINGS: Brain: Cerebral volume within normal limits for age. Small remote lacunar infarct again noted at the right lentiform nucleus. Few additional scattered subcentimeter foci of T2/FLAIR hyperintensities noted, nonspecific, but felt to be within normal limits for age. No abnormal foci of restricted diffusion to suggest acute or subacute ischemia. Gray-white matter differentiation maintained. No encephalomalacia to suggest chronic cortical infarction. No evidence for acute or chronic intracranial hemorrhage. No mass lesion, midline shift or mass effect. No hydrocephalus or extra-axial fluid collection. Pituitary gland suprasellar region within normal limits. Midline structures intact and normal. Vascular: Major intracranial vascular flow voids are well maintained and normal in appearance. Skull and upper cervical spine: Craniocervical junction within normal limits. No focal marrow replacing lesion. No scalp soft tissue abnormality. Sinuses/Orbits: Globes and orbital soft tissues demonstrate no acute finding. Paranasal sinuses are largely clear. No mastoid effusion. Inner ear structures grossly normal. Other: None. IMPRESSION: 1. No acute intracranial abnormality. 2. Small remote lacunar infarct involving the right lentiform nucleus, stable. Electronically Signed   By: Jeannine Boga M.D.   On: 12/21/2020 20:34   CT HEAD CODE STROKE WO CONTRAST  Result Date: 12/21/2020 CLINICAL DATA:  Code stroke. Acute neuro deficit. Left arm weakness left facial droop EXAM: CT HEAD WITHOUT CONTRAST TECHNIQUE: Contiguous axial images were obtained from the base of the skull through the vertex without intravenous contrast.  COMPARISON:  CT head 09/21/2020 FINDINGS: Brain: No evidence of acute infarction, hemorrhage, hydrocephalus, extra-axial collection or mass lesion/mass effect. Vascular: Negative for hyperdense vessel Skull: Negative Sinuses/Orbits: Negative Other: None ASPECTS ([place] Stroke Program Early CT Score) - Ganglionic level infarction (caudate, lentiform nuclei, internal capsule, insula, M1-M3 cortex): 7 - Supraganglionic infarction (M4-M6 cortex): 3 Total score (0-10 with 10 being normal): 10 IMPRESSION: 1. Negative CT head 2. ASPECTS is 10 3. Code stroke imaging results were communicated on 12/21/2020 at 5:51 pm to provider Lorrin Goodell via text page Electronically Signed   By: Franchot Gallo M.D.   On: 12/21/2020 17:52    Procedures Procedures (including critical care time)  Medications Ordered in ED Medications  aspirin chewable tablet 81 mg (has no administration in time range)  sodium chloride flush (NS) 0.9 % injection 3 mL (3 mLs Intravenous Given 12/21/20 1836)  magnesium sulfate IVPB 2 g 50 mL (0 g Intravenous Stopped 12/21/20 1900)  ketorolac (TORADOL) 15 MG/ML injection 15 mg (15 mg Intravenous Given 12/21/20 1822)  diphenhydrAMINE (BENADRYL) injection 25 mg (25 mg Intravenous Given 12/21/20 1820)    ED Course  I have reviewed the triage vital signs and the nursing notes.  Pertinent labs & imaging results that were available during my care of the patient were reviewed by me and considered in my medical decision making (see chart for details).    MDM Rules/Calculators/A&P                         50 year old female presents to the ED due to left-sided weakness associated with left visual loss and facial droop.  Last known well 1620.  History of CVA and TIA.  Currently on ASA 81 mg.  Code stroke initiated. Met team at the bridge. Patient maintaining airway. STAT CT performed. Upon arrival, patient is afebrile, not tachycardic or hypoxic.  Physical exam significant for left upper extremity weakness.   No facial droop.  Speech clear.  Mild left upper extremity drift. Stroke labs ordered.    CT head personally reviewed which is negative for any acute abnormalities.  CBC unremarkable no leukocytosis and normal hemoglobin.  CMP significant for hyperglycemia at 257 with no anion gap. Doubt DKA.  EKG personally reviewed which demonstrates normal sinus rhythm with no signs of acute ischemia.  Discussed case with Dr. Lorrin Goodell with neurology who recommends MRI and treat as possible complicated migraine with magnesium, Toradol, and benadryl. MRI personally reviewed which demonstrates:   IMPRESSION:  1. No acute intracranial abnormality.  2. Small remote lacunar infarct involving the right lentiform  nucleus, stable.   9:02 PM reassessed patient at bedside who notes resolution of symptoms after migraine cocktail.  Grip strength equal. No more left sided weakness. Instructed patient to follow-up with neurologist for further evaluation. Strict ED precautions discussed with patient. Patient states understanding and agrees to plan. Patient discharged home in no acute distress and stable vitals  Discussed case with Dr. Tyrone Nine who agrees with assessment and plan. Final Clinical Impression(s) / ED Diagnoses Final diagnoses:  Left-sided weakness  Stroke-like symptoms    Rx / DC Orders ED Discharge Orders    None       Karie Kirks 12/21/20 2109    Deno Etienne, DO 12/21/20 2129

## 2020-12-21 NOTE — ED Notes (Signed)
Patient transported to MRI 

## 2020-12-21 NOTE — Code Documentation (Signed)
Stroke Response Nurse Documentation Code Documentation  Natalie Greene is a 50 y.o. female arriving to [facility] ED via [facility] on 12/21/2020 with past medical hx of CVA. Code stroke was activated by EMS. Patient from home where she was LKW at 1620 and now complaining of left side weakness . On No antithrombotic. Stroke team at the bedside on patient arrival. Labs drawn and patient cleared for CT by EDP. Patient to CT with team. NIHSS 4, see documentation for details and code stroke times. Patient with left facial droop, left arm weakness, left leg weakness and left decreased sensation on exam. The following imaging was completed:  CT. Patient is not a candidate for tPA due to mild and improving symptoms Care/Plan. Bedside handoff with ED RN.  Neuro checks q 30 min and VS @ 15 min until 2050.  RN to notify MD if patient worsens.    Marcellina Millin  Stroke Response RN

## 2020-12-21 NOTE — Discharge Instructions (Addendum)
As discussed, your CT head and MRI were negative for any acute abnormalities.  It did show an old stroke which goes along with your history.  Please follow-up with your neurologist within the next week for further evaluation.  Return to the ER for new or worsening symptoms.

## 2020-12-21 NOTE — ED Triage Notes (Signed)
Assume care from EMS, EMS reports stroke s/s starting at 1620 EMS states lest side facial droop, left side weakness, left eye vision loss and word salad. EMS states pt has an hx of stroke a year ago and was given TPA then but not currently on blood thinners but on baby aspirin . Pt denies any headache at this time most s/s resolved upon arrival   150/72 CBG 272 99.3

## 2020-12-21 NOTE — ED Notes (Signed)
Pt d/c by MD and IS PROVIDED W/ D.C INSTRUCTIONS AND FOLLOW UP CARE, Pt is out of ED ambulatory with family

## 2020-12-21 NOTE — Consult Note (Signed)
NEUROLOGY CONSULTATION NOTE   Date of service: December 21, 2020 Patient Name: Natalie Greene MRN:  [id number] DOB:  24-Mar-1971 Reason for consult: "Stroke code" _ _ _   _ __   _ __ _ _  __ __   _ __   __ _  History of Present Illness  Natalie Greene is a 49 y.o. female with PMH significant for diabetes, GERD, hypertension, lupus, obstructive sleep apnea and prior right putamen lacunar infarct who presents with acute onset left-sided weakness with a last known well of 1620 on 12/21/2020.  She was at her mother's house when her symptoms started.  She endorses her symptoms started with problems with her vision on the left side, followed by inability to speak and then heaviness in left arm and left leg.  She reports she has had 2 identical episode in the past with similar symptoms.  She also endorses some occipital pressure-like headache on the right.  She feels like in the past 2 episodes, her symptoms improve after 30 to 45 minutes.  She endorses her vision is back to normal right now and her inability to speak has resolved and she feels that her left arm and left leg is getting better with resolution of the numbness. No loss of consciousness during this episode.  Work-up with CT head without contrast with no acute abnormalities.   NIHSS components Score: Comment  1a Level of Conscious 0[x]  1[]  2[]  3[]      1b LOC Questions 0[x]  1[]  2[]       1c LOC Commands 0[x]  1[]  2[]       2 Best Gaze 0[x]  1[]  2[]       3 Visual 0[x]  1[]  2[]  3[]      4 Facial Palsy 0[]  1[x]  2[]  3[]    Goes away with smiling.  5a Motor Arm - left 0[]  1[x]  2[]  3[]  4[]  UN[]    5b Motor Arm - Right 0[x]  1[]  2[]  3[]  4[]  UN[]    6a Motor Leg - Left 0[]  1[x]  2[]  3[]  4[]  UN[]    6b Motor Leg - Right 0[x]  1[]  2[]  3[]  4[]  UN[]    7 Limb Ataxia 0[x]  1[]  2[]  3[]  UN[]     8 Sensory 0[]  1[x]  2[]  UN[]      9 Best Language 0[x]  1[]  2[]  3[]      10 Dysarthria 0[x]  1[]  2[]  UN[]      11 Extinct. and Inattention 0[x]  1[]  2[]       TOTAL: 4   MRS:  0 TPA: not given, suspect that her symptoms are more consistent with a migraine rather than a stroke and her symptoms are improving. Thrombectomy: Not pursude due to low NIHSS and rapidly improving symptoms.   ROS   Constitutional Denies weight loss, fever and chills.   HEENT Denies changes in vision and hearing at this time.   Respiratory Denies SOB and cough.   CV Denies palpitations and CP   GI Denies abdominal pain, nausea, vomiting and diarrhea.   GU Denies dysuria and urinary frequency.   MSK Denies myalgia and joint pain.   Skin Denies rash and pruritus.   Neurological + headache but no syncope.   Psychiatric Denies recent changes in mood. Denies anxiety and depression.   Past History   Past Medical History:  Diagnosis Date  . Anemia   . Anxiety   . Cancer ([place])   . Constipation   . Depression   . Diabetes mellitus without complication ([place])   . Edema, lower extremity   . GERD (gastroesophageal reflux  disease)   . Hypertension   . Joint pain   . Lactose intolerance   . Lupus ([place])   . Osteoarthritis   . Sleep apnea   . Squamous cell carcinoma   . Stroke [facility])    Stroke in Jan-2020 / TIA -April 2020   Past Surgical History:  Procedure Laterality Date  . ABDOMINAL HYSTERECTOMY     approx 5 years ago from 2017   . CHOLECYSTECTOMY    . WISDOM TOOTH EXTRACTION     Family History  Problem Relation Age of Onset  . Cancer Father        colon and prostate  . Diabetes Father   . Breast cancer Other   . Depression Mother   . Anxiety disorder Mother    Social History   Socioeconomic History  . Marital status: Married    Spouse name: Not on file  . Number of children: 3  . Years of education: Not on file  . Highest education level: Associate degree: academic program  Occupational History  . Occupation: paralegal/nurse tech  Tobacco Use  . Smoking status: Never Smoker  . Smokeless tobacco: Never Used  Vaping Use  . Vaping Use: Never used  Substance and  Sexual Activity  . Alcohol use: Yes    Alcohol/week: 0.0 standard drinks  . Drug use: No  . Sexual activity: Not Currently    Partners: Male  Other Topics Concern  . 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 Wellbutrin [Bupropion] Other (See Comments)    Suicidal Ideation    Medications  (Not in a hospital admission)    Vitals   Vitals:   12/21/20 1700 12/21/20 1803 12/21/20 1815  BP:  132/78 (!) 151/92  Pulse:  63 (!) 57  Resp:  10 (!) 23  Temp:  98.3 F (36.8 C)   TempSrc:  Oral   SpO2:  100% 99%  Weight: 107.2 kg       Body mass index is 44.65 kg/m.  Physical Exam   General: Laying comfortably in bed; in no acute distress.  HENT: Normal oropharynx and mucosa. Normal external appearance of ears and nose.  Neck: Supple, no pain or tenderness  CV: No JVD. No peripheral edema.  Pulmonary: Symmetric Chest rise. Normal respiratory effort.  Abdomen: Soft to touch, non-tender.  Ext: No cyanosis, edema, or deformity  Skin: No rash. Normal palpation of skin.   Musculoskeletal: Normal digits and nails by inspection. No clubbing.   Neurologic Examination  Mental status/Cognition: Alert, oriented to self, place, month and year, good attention.  Speech/language: Fluent, comprehension intact, object naming intact, repetition intact.  Cranial nerves:   CN II Pupils equal and reactive to light, no VF deficits     CN III,IV,VI EOM intact, no gaze  preference or deviation, no nystagmus    CN V normal sensation in V1, V2, and V3 segments bilaterally    CN VII no asymmetry, no nasolabial fold flattening   CN VIII normal hearing to speech    CN IX & X normal palatal elevation, no uvular deviation    CN XI 5/5 head turn and 5/5 shoulder shrug bilaterally    CN XII midline tongue protrusion    Motor:  Muscle bulk: normal, tone normal, pronator drift LUE drift tremor none Mvmt Root Nerve  Muscle Right Left Comments  SA C5/6 Ax Deltoid 5 4+   EF C5/6 Mc Biceps 5 4+   EE C6/7/8 Rad Triceps 5 4+   WF C6/7 Med FCR 5 4+   WE C7/8 PIN ECU 5 4+   F Ab C8/T1 U ADM/FDI 5 4+   HF L1/2/3 Fem Illopsoas 5 5   KE L2/3/4 Fem Quad 5 5   DF L4/5 D Peron Tib Ant 5 5   PF S1/2 Tibial Grc/Sol 5 5    Reflexes:  Right Left Comments  Pectoralis      Biceps (C5/6) 2+ 2+   Brachioradialis (C5/6) 2+ 2+    Triceps (C6/7) 2+ 2+    Patellar (L3/4) 3 3    Achilles (S1) 2 2    Hoffman      Plantar     Jaw jerk    Sensation:  Light touch Intact throughout   Pin prick Intact throughout   Temperature    Vibration   Proprioception    Coordination/Complex Motor:  - Finger to Nose intact BL - Heel to shin intact BL - Rapid alternating movement are slowed in LUE - Gait: Deferred.  Labs   CBC:  Recent Labs  Lab 12/21/20 1735 12/21/20 1742  WBC 8.3  --   NEUTROABS 5.5  --   HGB 12.7 13.3  HCT 40.8 39.0  MCV 86.8  --   PLT 243  --     Basic Metabolic Panel:  Lab Results  Component Value Date   NA 137 12/21/2020   K 4.0 12/21/2020   CO2 25 12/21/2020   GLUCOSE 253 (H) 12/21/2020   BUN 16 12/21/2020   CREATININE 0.60 12/21/2020   CALCIUM 9.2 12/21/2020   GFRNONAA >60 12/21/2020   GFRAA 115 04/12/2020   Lipid Panel:  Lab Results  Component Value Date   LDLCALC 92 04/12/2020   HgbA1c:  Lab Results  Component Value Date   HGBA1C 6.5 (H) 04/12/2020   Urine Drug Screen:     Component Value  Date/Time   LABOPIA NONE DETECTED 05/07/2019 1232   COCAINSCRNUR NONE DETECTED 05/07/2019 1232   LABBENZ NONE DETECTED 05/07/2019 1232   AMPHETMU NONE DETECTED 05/07/2019 1232   THCU NONE DETECTED 05/07/2019 1232   LABBARB NONE DETECTED 05/07/2019 1232    Alcohol Level     Component Value Date/Time   ETH <10 05/07/2019 1232    CT Head without contrast: Personally reviewed and CTH was negative for a large hypodensity concerning for a large territory infarct or hyperdensity concerning for an ICH   MRI Brain pending.  Impression   KIAIRA POINTER is a 50 y.o. female with PMH significant for diabetes, GERD, hypertension, lupus, obstructive sleep apnea and prior right putamen lacunar infarct who presents with acute onset left-sided weakness, now improving. She endorses 2 prior identical episodes with resolution and no residual symptopms. She also endorses a pressure like occipital headache. 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 Given improving symptoms and prior similar episode, tPA was not offered. She is too mild to consider potential thrombectomy.  We will give her a 1 time migraine cocktail, get an MRI Brain without contrast and monitor her for worsening of her symptoms.  Recommendations  - Headache cocktail with benadryl, Magnesium and Ketorolac - MRI Brain without contrast - Aspirin 81mg  once now - No further inpatient workup if MRI Brain is negative. Recommend follow up with neurology outpatient.  ______________________________________________________________________   Thank you for the opportunity to take part in the care of this patient. If you have any further questions, please contact the neurology consultation attending.  Signed,  [place] Pager Number [phone] _ _ _   _ __   _ __ _ _  __ __   _ __   __ _

## 2021-02-09 ENCOUNTER — Other Ambulatory Visit: Payer: Self-pay | Admitting: Internal Medicine

## 2021-02-09 DIAGNOSIS — R1310 Dysphagia, unspecified: Secondary | ICD-10-CM

## 2021-02-09 DIAGNOSIS — Z1231 Encounter for screening mammogram for malignant neoplasm of breast: Secondary | ICD-10-CM

## 2021-02-22 ENCOUNTER — Other Ambulatory Visit: Payer: Self-pay

## 2021-02-22 ENCOUNTER — Ambulatory Visit
Admission: RE | Admit: 2021-02-22 | Discharge: 2021-02-22 | Disposition: A | Discharge status: Home / Self Care | Payer: Medicaid Other | Source: Ambulatory Visit | Attending: Internal Medicine | Admitting: Internal Medicine

## 2021-02-22 DIAGNOSIS — R1310 Dysphagia, unspecified: Secondary | ICD-10-CM | POA: Insufficient documentation

## 2021-05-24 ENCOUNTER — Ambulatory Visit
Admission: RE | Admit: 2021-05-24 | Discharge: 2021-05-24 | Disposition: A | Discharge status: Home / Self Care | Payer: Self-pay | Source: Ambulatory Visit | Attending: Oncology | Admitting: Oncology

## 2021-05-24 ENCOUNTER — Encounter: Payer: Self-pay | Admitting: *Deleted

## 2021-05-24 ENCOUNTER — Ambulatory Visit: Payer: Medicaid Other | Attending: Oncology | Admitting: *Deleted

## 2021-05-24 ENCOUNTER — Other Ambulatory Visit: Payer: Self-pay

## 2021-05-24 VITALS — BP 124/68 | HR 56 | Temp 96.2°F | Ht 62.21 in | Wt 232.2 lb

## 2021-05-24 DIAGNOSIS — Z0000 Encounter for general adult medical examination without abnormal findings: Secondary | ICD-10-CM | POA: Insufficient documentation

## 2021-05-24 NOTE — Patient Instructions (Signed)
Gave patient hand-out, Women Staying Healthy, Active and Well from [facility], with education on breast health, pap smears, heart and colon health. 

## 2021-05-24 NOTE — Progress Notes (Signed)
  Subjective:     Patient ID: Natalie Greene, female   DOB: [birth year], 50 y.o.   MRN: [id number]  HPI   Review of Systems     Objective:   Physical Exam Chest:  Breasts:     Breasts are asymmetrical.     Right: No swelling, bleeding, inverted nipple, mass, nipple discharge, skin change, tenderness, axillary adenopathy or supraclavicular adenopathy.     Left: No swelling, bleeding, inverted nipple, mass, nipple discharge, skin change, tenderness, axillary adenopathy or supraclavicular adenopathy.      Comments: Right breast larger than the left Lymphadenopathy:     Upper Body:     Right upper body: No supraclavicular or axillary adenopathy.     Left upper body: No supraclavicular or axillary adenopathy.        Assessment:     50 year female presents to [facility] for clinical breast exam and mammogram only.  Clinical breast exam unremarkable.  Taught self breast awareness.  Patient with a history of a hysterectomy.  Pap omitted per protocol.  Patient has been screened for eligibility.  She does not have any insurance, Medicare or Medicaid.  She also meets financial eligibility.   Risk Assessment    Risk Scores      05/24/2021   Last edited by: Theodore Demark, RN   5-year risk: 0.7 %   Lifetime risk: 7.3 %            Plan:     Screening mammogram ordered.  Will follow up per [facility] protocol.

## 2021-05-26 ENCOUNTER — Encounter: Payer: Self-pay | Admitting: *Deleted

## 2021-05-26 NOTE — Progress Notes (Signed)
lam

## 2021-05-27 ENCOUNTER — Other Ambulatory Visit: Payer: Self-pay

## 2021-05-27 ENCOUNTER — Emergency Department
Admission: EM | Admit: 2021-05-27 | Discharge: 2021-05-27 | Disposition: A | Discharge status: Home / Self Care | Payer: Medicaid Other | Attending: Emergency Medicine | Admitting: Emergency Medicine

## 2021-05-27 ENCOUNTER — Encounter: Payer: Self-pay | Admitting: Emergency Medicine

## 2021-05-27 DIAGNOSIS — Z85828 Personal history of other malignant neoplasm of skin: Secondary | ICD-10-CM | POA: Insufficient documentation

## 2021-05-27 DIAGNOSIS — M722 Plantar fascial fibromatosis: Secondary | ICD-10-CM | POA: Insufficient documentation

## 2021-05-27 DIAGNOSIS — E119 Type 2 diabetes mellitus without complications: Secondary | ICD-10-CM | POA: Insufficient documentation

## 2021-05-27 DIAGNOSIS — Z794 Long term (current) use of insulin: Secondary | ICD-10-CM | POA: Insufficient documentation

## 2021-05-27 DIAGNOSIS — Z79899 Other long term (current) drug therapy: Secondary | ICD-10-CM | POA: Insufficient documentation

## 2021-05-27 DIAGNOSIS — Z7982 Long term (current) use of aspirin: Secondary | ICD-10-CM | POA: Insufficient documentation

## 2021-05-27 DIAGNOSIS — I10 Essential (primary) hypertension: Secondary | ICD-10-CM | POA: Insufficient documentation

## 2021-05-27 MED ORDER — MELOXICAM 15 MG PO TABS: 15.0000 mg | ORAL_TABLET | Freq: Every day | ORAL | 0 refills | Status: AC

## 2021-05-27 MED ORDER — MELOXICAM 7.5 MG PO TABS
15.0000 mg | ORAL_TABLET | Freq: Once | ORAL | Status: AC
  Administered 2021-05-27: 15 mg via ORAL
  Filled 2021-05-27: qty 2

## 2021-05-27 NOTE — ED Notes (Signed)
First Nurse Note: Pt ambulatory into ED for left foot pain. Pt states that she called the podiatrist on call and they told her to check in after she got off work.

## 2021-05-27 NOTE — ED Triage Notes (Signed)
Pt via POV from home. Pt c/o L foot pain and swelling for couple of weeks. Pt had X-Ray done 2 days ago and it stated, "12 mm linear metallic foreign body within the plantar soft tissue of the forefoot." No puncture wound noted. Pt has a hx of DM. Pt is A&Ox4 and NAD.

## 2021-05-27 NOTE — ED Provider Notes (Signed)
[facility] Emergency Department Provider Note  ____________________________________________  Time seen: Approximately 6:54 PM  I have reviewed the triage vital signs and the nursing notes.   HISTORY  Chief Complaint Foot Pain    HPI Natalie Greene is a 50 y.o. female who presents the emergency department complaining of left foot pain.  Patient had been seen at [facility] clinic podiatry earlier this week with x-ray that revealed a foreign body that appears to be a needle in the left foot.  This appears to be an old injury and podiatry was going to follow-up with the patient.  As the pain is increased it was recommended that she come to the emergency department and podiatry evaluated the patient while here in the ED.  Podiatry believes that this is likely plantar fasciitis and not from a foreign body.  Podiatry is advised that there is no evidence of of infection at this time.  Patient has a medical history as described below with no complaints with chronic medical issues.       Past Medical History:  Diagnosis Date   Anemia    Anxiety    Cancer ([place])    Constipation    Depression    Diabetes mellitus without complication ([place])    Edema, lower extremity    GERD (gastroesophageal reflux disease)    Hypertension    Joint pain    Lactose intolerance    Lupus ([place])    Osteoarthritis    Sleep apnea    Squamous cell carcinoma    Stroke [facility])    Stroke in Jan-2020 / TIA -April 2020    Patient Active Problem List   Diagnosis Date Noted   Left carpal tunnel syndrome 02/05/2020   Right carpal tunnel syndrome 02/05/2020   Body mass index 40.0-44.9, adult ([place]) 01/08/2020   Morbid obesity ([place]) 01/08/2020   Primary osteoarthritis of right knee 01/07/2020   TIA (transient ischemic attack) 05/07/2019   Vaginitis and vulvovaginitis 04/15/2016   Right knee pain 03/09/2016   GERD (gastroesophageal reflux disease) 03/09/2016   Influenza with respiratory  manifestation 02/14/2016   H/O cold sores 06/21/2015   Diabetes mellitus type II, controlled ([place]) 06/13/2015   Hypokalemia 05/25/2015   Encounter to establish care 02/07/2015   Severe obesity (BMI >= 40) ([place]) 02/07/2015   Generalized anxiety disorder 02/07/2015    Past Surgical History:  Procedure Laterality Date   ABDOMINAL HYSTERECTOMY     approx 5 years ago from 2017    [place]      Prior to Admission medications   Medication Sig Start Date End Date Taking? Authorizing Provider  meloxicam (MOBIC) 15 MG tablet Take 1 tablet (15 mg total) by mouth daily. 05/27/21  Yes Antwane Grose, Charline Bills, PA-C  ALPRAZolam (XANAX) 1 MG tablet TAKE ONE TABLET BY MOUTH AT BEDTIME AS NEEDED FOR ANXIETY Patient taking differently: Take 1 mg by mouth at bedtime as needed for sleep.  10/10/16   Leone Haven, MD  Ascorbic Acid (VITAMIN C PO) Take 1 tablet by mouth daily.    [provider]  aspirin 81 MG chewable tablet Chew 81 mg by mouth daily.    [provider]  atorvastatin (LIPITOR) 20 MG tablet Take 1 tablet (20 mg total) by mouth daily. 03/20/19 04/12/20  Carrie Mew, MD  celecoxib (CELEBREX) 200 MG capsule Take 200 mg by mouth daily.    [provider]  Diclofenac Sodium (PENNSAID) 2 % SOLN  Apply 2 g topically 2 (two) times daily as needed (to affected area). 01/08/20   Leandrew Koyanagi, MD  escitalopram (LEXAPRO) 10 MG tablet Take 10 mg by mouth daily. 05/02/19   [provider]  esomeprazole (NEXIUM) 40 MG capsule Take 1 capsule (40 mg total) by mouth daily at 12 noon. 09/04/16   Leone Haven, MD  furosemide (LASIX) 20 MG tablet Take 20 mg by mouth daily.     [provider]  liraglutide (VICTOZA) 18 MG/3ML SOPN Inject 1.2 mg into the skin daily.    [provider]  lisinopril (PRINIVIL,ZESTRIL) 5 MG tablet Take 1 tablet (5 mg total) by mouth daily. 03/20/19 03/19/20  Carrie Mew, MD   oxyCODONE-acetaminophen (PERCOCET) 10-325 MG tablet Take 1 tablet by mouth every 4 (four) hours as needed for pain.    [provider]  valACYclovir (VALTREX) 500 MG tablet TAKE 1 TABLET (500 MG TOTAL) BY MOUTH 2 (TWO) TIMES DAILY. Patient taking differently: Take 500 mg by mouth 2 (two) times daily as needed (for fever blisters).  10/17/16   Leone Haven, MD  VITAMIN E PO Take 1 capsule by mouth daily.    [provider]    Allergies Wellbutrin [bupropion]  Family History  Problem Relation Age of Onset   Cancer Father        colon and prostate   Diabetes Father    Breast cancer Other    Depression Mother    Anxiety disorder Mother     Social History Social History   Tobacco Use   Smoking status: Never   Smokeless tobacco: Never  Vaping Use   Vaping Use: Never used  Substance Use Topics   Alcohol use: Yes    Alcohol/week: 0.0 standard drinks   Drug use: No     Review of Systems  Constitutional: No fever/chills Eyes: No visual changes. No discharge ENT: No upper respiratory complaints. Cardiovascular: no chest pain. Respiratory: no cough. No SOB. Gastrointestinal: No abdominal pain.  No nausea, no vomiting.  Musculoskeletal: Positive for left foot pain Skin: Negative for rash, abrasions, lacerations, ecchymosis. Neurological: Negative for headaches, focal weakness or numbness.  10 System ROS otherwise negative.  ____________________________________________   PHYSICAL EXAM:  VITAL SIGNS: ED Triage Vitals  Enc Vitals Group     BP 05/27/21 1521 (!) 144/75     Pulse Rate 05/27/21 1521 64     Resp 05/27/21 1521 20     Temp 05/27/21 1521 98.2 F (36.8 C)     Temp Source 05/27/21 1521 Oral     SpO2 05/27/21 1521 98 %     Weight 05/27/21 1522 229 lb (103.9 kg)     Height 05/27/21 1522 5\' 1"  (1.549 m)     Head Circumference --      Peak Flow --      Pain Score 05/27/21 1521 10     Pain Loc --      Pain Edu? --      Excl. in [place]? --       Constitutional: Alert and oriented. Well appearing and in no acute distress. Eyes: Conjunctivae are normal. PERRL. EOMI. Head: Atraumatic. ENT:      Ears:       Nose: No congestion/rhinnorhea.      Mouth/Throat: Mucous membranes are moist.  Neck: No stridor.    Cardiovascular: Normal rate, regular rhythm. Normal S1 and S2.  Good peripheral circulation. Respiratory: Normal respiratory effort without tachypnea or retractions. Lungs CTAB.  Good air entry to the bases with no decreased or absent breath sounds. Musculoskeletal: Full range of motion to all extremities. No gross deformities appreciated.  No visible foreign body, erythema, edema to the sole of the foot.  Diffuse tenderness along the plantar fascia.  No palpable abnormality.  Dorsalis pedis pulse intact.  Sensation intact all digits Neurologic:  Normal speech and language. No gross focal neurologic deficits are appreciated.  Skin:  Skin is warm, dry and intact. No rash noted. Psychiatric: Mood and affect are normal. Speech and behavior are normal. Patient exhibits appropriate insight and judgement.   ____________________________________________   LABS (all labs ordered are listed, but only abnormal results are displayed)  Labs Reviewed - No data to display ____________________________________________  EKG   ____________________________________________  RADIOLOGY   No results found.  ____________________________________________    PROCEDURES  Procedure(s) performed:    Procedures    Medications  meloxicam (MOBIC) tablet 15 mg (has no administration in time range)     ____________________________________________   INITIAL IMPRESSION / ASSESSMENT AND PLAN / ED COURSE  Pertinent labs & imaging results that were available during my care of the patient were reviewed by me and considered in my medical decision making (see chart for details).  Review of the [place] CSRS was performed in accordance of the  [place] prior to dispensing any controlled drugs.           Patient's diagnosis is consistent with plantar fasciitis.  Patient presented to the emergency department increasing plantar foot pain to the left foot.  Patient with a known foreign body to the left foot.  Podiatry has evaluated the patient while in the emergency department advised that there is no evidence of infection and they do not believe that the foreign body is causing the sensation is that there does not appear to be a recent injury with this foreign body.  They recommend conservative treatment with anti-inflammatory, postop shoe and follow-up with podiatry this week.  If symptoms are worsening, not improving they will manage the patient at that time.  Concerning signs and symptoms to be concern for infection have been discussed with the patient.  This time patient will have anti-inflammatory postop shoe.  He can follow-up podiatry this week. Patient is given ED precautions to return to the ED for any worsening or new symptoms.     ____________________________________________  FINAL CLINICAL IMPRESSION(S) / ED DIAGNOSES  Final diagnoses:  Plantar fasciitis of left foot      NEW MEDICATIONS STARTED DURING THIS VISIT:  ED Discharge Orders          Ordered    meloxicam (MOBIC) 15 MG tablet  Daily        05/27/21 1915                This chart was dictated using voice recognition software/Dragon. Despite best efforts to proofread, errors can occur which can change the meaning. Any change was purely unintentional.    Darletta Moll, PA-C 05/27/21 1916    Naaman Plummer, MD 05/27/21 [phone]

## 2021-07-21 ENCOUNTER — Telehealth: Payer: Self-pay | Admitting: Radiology

## 2021-07-21 NOTE — Telephone Encounter (Signed)
Sure, just for the morning.  Thanks.

## 2021-07-21 NOTE — Telephone Encounter (Signed)
Patient left voicemail on triage line requesting to be worked in to Dr. Phoebe Sharps schedule on Tuesday, 8/9 for right knee pain and swelling.  She requests return call to [phone].  I will be glad to return call to patient if you let me know whether or not she is able to be worked in and for what time.

## 2021-07-21 NOTE — Telephone Encounter (Signed)
Appt made for tues at 8:15  Called patient no answer LMOM.  She will call to confirm appt.

## 2021-07-25 ENCOUNTER — Other Ambulatory Visit: Payer: Self-pay

## 2021-07-25 ENCOUNTER — Encounter: Payer: Self-pay | Admitting: Orthopaedic Surgery

## 2021-07-25 ENCOUNTER — Ambulatory Visit (INDEPENDENT_AMBULATORY_CARE_PROVIDER_SITE_OTHER): Payer: Self-pay | Admitting: Orthopaedic Surgery

## 2021-07-25 ENCOUNTER — Ambulatory Visit (INDEPENDENT_AMBULATORY_CARE_PROVIDER_SITE_OTHER): Payer: Self-pay

## 2021-07-25 VITALS — Ht 59.5 in | Wt 232.0 lb

## 2021-07-25 DIAGNOSIS — M1711 Unilateral primary osteoarthritis, right knee: Secondary | ICD-10-CM

## 2021-07-25 DIAGNOSIS — M722 Plantar fascial fibromatosis: Secondary | ICD-10-CM

## 2021-07-25 MED ORDER — LIDOCAINE HCL 1 % IJ SOLN
2.0000 mL | INTRAMUSCULAR | Status: AC | PRN
  Administered 2021-07-25: 2 mL

## 2021-07-25 MED ORDER — METHYLPREDNISOLONE ACETATE 40 MG/ML IJ SUSP
40.0000 mg | INTRAMUSCULAR | Status: AC | PRN
  Administered 2021-07-25: 40 mg via INTRA_ARTICULAR

## 2021-07-25 MED ORDER — BUPIVACAINE HCL 0.25 % IJ SOLN
2.0000 mL | INTRAMUSCULAR | Status: AC | PRN
  Administered 2021-07-25: 2 mL via INTRA_ARTICULAR

## 2021-07-25 NOTE — Progress Notes (Signed)
Office Visit Note   Patient: Natalie Greene           Date of Birth: 1971/07/19           MRN: [id number] Visit Date: 07/25/2021              Requested by: Idelle Crouch, MD [facility] [place],  [place] [address] PCP: Idelle Crouch, MD   Assessment & Plan: Visit Diagnoses:  1. Primary osteoarthritis of right knee   2. Plantar fasciitis of left foot     Plan: Impression is advanced generative joint disease right knee and left foot plantar fasciitis.  Regards to the right knee, we have discussed repeat cortisone injection today for which she would like to proceed.  She also knows she needs to lose weight to get to approximately 195 pounds in order to meet a BMI of less than 40 to proceed with surgery.  In regards to the left heel, I am not comfortable reinjecting this so soon after her previous injection.  We have continued to recommend weight loss and prolonged stretching.  She will follow-up with [place] as needed.  Call with concerns or questions.  Follow-Up Instructions: Return if symptoms worsen or fail to improve.   Orders:  Orders Placed This Encounter  Procedures   Large Joint Inj: R knee   XR KNEE 3 VIEW RIGHT   XR Os Calcis Left   No orders of the defined types were placed in this encounter.     Procedures: Large Joint Inj: R knee on 07/25/2021 8:47 AM Indications: pain Details: 22 G needle, anterolateral approach Medications: 2 mL lidocaine 1 %; 2 mL bupivacaine 0.25 %; 40 mg methylPREDNISolone acetate 40 MG/ML     Clinical Data: No additional findings.   Subjective: Chief Complaint  Patient presents with   Left Foot - Pain   Right Knee - Pain    HPI patient is a pleasant 50 year old female who comes in today with chronic right knee pain from underlying osteoarthritis in addition to left heel pain.  In regards to the right knee, she has had many years of pain.  She has intermittent swelling.  Pain is worse with walking as  well as when she is trying to sleep.  She describes this as a constant throb.  She has tried cortisone and viscosupplementation injections in the past without long-lasting relief.  She has been trying to lose weight to get to a BMI of under 40 in order to proceed with total knee replacement.  In regards to the left foot, she has had pain to the heel for the past 4 to 5 months.  No new injury or change in activity.  She was seen by podiatrist where appropriate shoes and inserts were recommended in addition to stretching and cortisone injection.  She has not had any relief from these modalities.  She is requesting a repeat cortisone injection today.  Of note the cortisone injection by her podiatrist was about a month ago.  Review of Systems as detailed in HPI.  All others reviewed and are negative.   Objective: Vital Signs: Ht 4' 11.5" (1.511 m)   Wt 232 lb (105.2 kg)   BMI 46.07 kg/m   Physical Exam well-developed well-nourished female in no acute distress.  Alert and oriented x3.  Ortho Exam right knee exam shows range of motion from 0 to 100 degrees.  Moderate patellofemoral crepitus.  Medial and lateral joint line tenderness.  Ligaments are stable.  She is neurovascular intact distally.  Left foot exam shows moderate tenderness to the plantar fashion insertion at the heel.  Dorsiflexion to about 15 degrees.  She is neurovascular intact distally.  Specialty Comments:  No specialty comments available.  Imaging: XR Os Calcis Left  Result Date: 07/25/2021 X-rays demonstrate osteophyte to the plantar fashion insertion at the heel  XR KNEE 3 VIEW RIGHT  Result Date: 07/25/2021 X-rays demonstrate advanced degenerative changes to the medial and patellofemoral compartments    PMFS History: Patient Active Problem List   Diagnosis Date Noted   Left carpal tunnel syndrome 02/05/2020   Right carpal tunnel syndrome 02/05/2020   Body mass index 40.0-44.9, adult ([place]) 01/08/2020   Morbid obesity  ([place]) 01/08/2020   Primary osteoarthritis of right knee 01/07/2020   TIA (transient ischemic attack) 05/07/2019   Vaginitis and vulvovaginitis 04/15/2016   Right knee pain 03/09/2016   GERD (gastroesophageal reflux disease) 03/09/2016   Influenza with respiratory manifestation 02/14/2016   H/O cold sores 06/21/2015   Diabetes mellitus type II, controlled ([place]) 06/13/2015   Hypokalemia 05/25/2015   Encounter to establish care 02/07/2015   Severe obesity (BMI >= 40) ([place]) 02/07/2015   Generalized anxiety disorder 02/07/2015   Past Medical History:  Diagnosis Date   Anemia    Anxiety    Cancer ([place])    Constipation    Depression    Diabetes mellitus without complication (HCC)    Edema, lower extremity    GERD (gastroesophageal reflux disease)    Hypertension    Joint pain    Lactose intolerance    Lupus ([place])    Osteoarthritis    Sleep apnea    Squamous cell carcinoma    Stroke ([place])    Stroke in Jan-2020 / TIA -April 2020    Family History  Problem Relation Age of Onset   Cancer Father        colon and prostate   Diabetes Father    Breast cancer Other    Depression Mother    Anxiety disorder Mother     Past Surgical History:  Procedure Laterality Date   ABDOMINAL HYSTERECTOMY     approx 5 years ago from 2017    CHOLECYSTECTOMY     WISDOM TOOTH EXTRACTION     Social History   Occupational History   Occupation: Chiropodist  Tobacco Use   Smoking status: Never   Smokeless tobacco: Never  Vaping Use   Vaping Use: Never used  Substance and Sexual Activity   Alcohol use: Yes    Alcohol/week: 0.0 standard drinks   Drug use: No   Sexual activity: Not Currently    Partners: Male

## 2021-12-20 DIAGNOSIS — G5603 Carpal tunnel syndrome, bilateral upper limbs: Secondary | ICD-10-CM | POA: Diagnosis not present

## 2021-12-20 DIAGNOSIS — R519 Headache, unspecified: Secondary | ICD-10-CM | POA: Diagnosis not present

## 2021-12-20 DIAGNOSIS — R200 Anesthesia of skin: Secondary | ICD-10-CM | POA: Diagnosis not present

## 2021-12-20 DIAGNOSIS — Z8673 Personal history of transient ischemic attack (TIA), and cerebral infarction without residual deficits: Secondary | ICD-10-CM | POA: Diagnosis not present

## 2021-12-20 DIAGNOSIS — M1711 Unilateral primary osteoarthritis, right knee: Secondary | ICD-10-CM | POA: Diagnosis not present

## 2021-12-20 DIAGNOSIS — E118 Type 2 diabetes mellitus with unspecified complications: Secondary | ICD-10-CM | POA: Diagnosis not present

## 2022-01-29 DIAGNOSIS — R200 Anesthesia of skin: Secondary | ICD-10-CM | POA: Diagnosis not present

## 2022-02-02 DIAGNOSIS — E1169 Type 2 diabetes mellitus with other specified complication: Secondary | ICD-10-CM | POA: Diagnosis not present

## 2022-02-02 DIAGNOSIS — E785 Hyperlipidemia, unspecified: Secondary | ICD-10-CM | POA: Diagnosis not present

## 2022-02-02 DIAGNOSIS — Z79899 Other long term (current) drug therapy: Secondary | ICD-10-CM | POA: Diagnosis not present

## 2022-02-05 ENCOUNTER — Other Ambulatory Visit: Payer: Self-pay | Admitting: Neurology

## 2022-02-05 DIAGNOSIS — I671 Cerebral aneurysm, nonruptured: Secondary | ICD-10-CM

## 2022-02-09 DIAGNOSIS — E785 Hyperlipidemia, unspecified: Secondary | ICD-10-CM | POA: Diagnosis not present

## 2022-02-09 DIAGNOSIS — E1169 Type 2 diabetes mellitus with other specified complication: Secondary | ICD-10-CM | POA: Diagnosis not present

## 2022-02-15 ENCOUNTER — Other Ambulatory Visit: Payer: Self-pay

## 2022-02-15 ENCOUNTER — Ambulatory Visit
Admission: RE | Admit: 2022-02-15 | Discharge: 2022-02-15 | Disposition: A | Discharge status: Home / Self Care | Payer: 59 | Source: Ambulatory Visit | Attending: Neurology | Admitting: Neurology

## 2022-02-15 DIAGNOSIS — I671 Cerebral aneurysm, nonruptured: Secondary | ICD-10-CM | POA: Diagnosis not present

## 2022-02-22 ENCOUNTER — Other Ambulatory Visit: Payer: Self-pay

## 2022-02-22 ENCOUNTER — Ambulatory Visit: Payer: 59 | Admitting: Orthopaedic Surgery

## 2022-02-22 ENCOUNTER — Telehealth: Payer: Self-pay

## 2022-02-22 ENCOUNTER — Other Ambulatory Visit: Payer: Self-pay | Admitting: *Deleted

## 2022-02-22 VITALS — Ht 60.0 in | Wt 220.8 lb

## 2022-02-22 DIAGNOSIS — Z6841 Body Mass Index (BMI) 40.0 and over, adult: Secondary | ICD-10-CM | POA: Diagnosis not present

## 2022-02-22 DIAGNOSIS — M1711 Unilateral primary osteoarthritis, right knee: Secondary | ICD-10-CM

## 2022-02-22 NOTE — Progress Notes (Signed)
? ?Office Visit Note ?  ?Patient: Natalie Greene           ?Date of Birth: 1971/12/02           ?MRN: [id number] ?Visit Date: 02/22/2022 ?             ?Requested by: Idelle Crouch, MD ?[facility] ?[place],  [place] [address] ?PCP: Idelle Crouch, MD ? ? ?Assessment & Plan: ?Visit Diagnoses:  ?1. Primary osteoarthritis of right knee   ?2. Body mass index 40.0-44.9, adult ([place])   ?3. Morbid obesity ([place])   ? ? ?Plan: Impression is advanced right knee degenerative joint disease.  Varus deformity with bone-on-bone medial compartment.  Again we talked about treatment options and she would like to try saphenous nerve ablation to see if this will help with her pain.  She understands that this is a temporary treatment which if effective what need to be repeated every so often.  She will follow-up as needed. ? ?Follow-Up Instructions: No follow-ups on file.  ? ?Orders:  ?No orders of the defined types were placed in this encounter. ? ?No orders of the defined types were placed in this encounter. ? ? ? ? Procedures: ?No procedures performed ? ? ?Clinical Data: ?No additional findings. ? ? ?Subjective: ?Chief Complaint  ?Patient presents with  ? Right Knee - Pain  ? ? ?HPI ? ?Trynity returns today for follow-up severe right knee pain due to DJD.  She has been successful in losing weight.  Her BMI is 43 today.  She continues to have constant pain.  She takes oxycodone prescribed by PCP.  She also takes meloxicam.  She is unable to walk to the mailbox.  She works as a Engineer, building services at [employer] and she has a lot of difficulty doing her job because of her knee. ? ?Review of Systems  ?Constitutional: Negative.   ?HENT: Negative.    ?Eyes: Negative.   ?Respiratory: Negative.    ?Cardiovascular: Negative.   ?Endocrine: Negative.   ?Musculoskeletal: Negative.   ?Neurological: Negative.   ?Hematological: Negative.   ?Psychiatric/Behavioral: Negative.    ?All other systems  reviewed and are negative. ? ? ?Objective: ?Vital Signs: Ht 5' (1.524 m)   Wt 220 lb 12.8 oz (100.2 kg)   BMI 43.12 kg/m?  ? ?Physical Exam ?Vitals and nursing note reviewed.  ?Constitutional:   ?   Appearance: She is well-developed.  ?Pulmonary:  ?   Effort: Pulmonary effort is normal.  ?Skin: ?   General: Skin is warm.  ?   Capillary Refill: Capillary refill takes less than 2 seconds.  ?Neurological:  ?   Mental Status: She is alert and oriented to person, place, and time.  ?Psychiatric:     ?   Behavior: Behavior normal.     ?   Thought Content: Thought content normal.     ?   Judgment: Judgment normal.  ? ? ?Ortho Exam ? ?Examination of the right knee shows trace effusion.  1+ patellofemoral crepitus with range of motion.  Collaterals and cruciates are stable. ? ?Specialty Comments:  ?No specialty comments available. ? ?Imaging: ?No results found. ? ? ?PMFS History: ?Patient Active Problem List  ? Diagnosis Date Noted  ? Left carpal tunnel syndrome 02/05/2020  ? Right carpal tunnel syndrome 02/05/2020  ? Body mass index 40.0-44.9, adult ([place]) 01/08/2020  ? Morbid obesity ([place]) 01/08/2020  ? Primary osteoarthritis of right knee 01/07/2020  ?  TIA (transient ischemic attack) 05/07/2019  ? Vaginitis and vulvovaginitis 04/15/2016  ? Right knee pain 03/09/2016  ? GERD (gastroesophageal reflux disease) 03/09/2016  ? Influenza with respiratory manifestation 02/14/2016  ? H/O cold sores 06/21/2015  ? Diabetes mellitus type II, controlled ([place]) 06/13/2015  ? Hypokalemia 05/25/2015  ? Encounter to establish care 02/07/2015  ? Severe obesity (BMI >= 40) ([place]) 02/07/2015  ? Generalized anxiety disorder 02/07/2015  ? ?Past Medical History:  ?Diagnosis Date  ? Anemia   ? Anxiety   ? Cancer [facility])   ? Constipation   ? Depression   ? Diabetes mellitus without complication ([place])   ? Edema, lower extremity   ? GERD (gastroesophageal reflux disease)   ? Hypertension   ? Joint pain   ? Lactose intolerance   ? Lupus ([place])   ?  Osteoarthritis   ? Sleep apnea   ? Squamous cell carcinoma   ? Stroke [facility])   ? Stroke in Jan-2020 / TIA -April 2020  ?  ?Family History  ?Problem Relation Age of Onset  ? Cancer Father   ?     colon and prostate  ? Diabetes Father   ? Breast cancer Other   ? Depression Mother   ? Anxiety disorder Mother   ?  ?Past Surgical History:  ?Procedure Laterality Date  ? ABDOMINAL HYSTERECTOMY    ? approx 5 years ago from 2017   ? CHOLECYSTECTOMY    ? WISDOM TOOTH EXTRACTION    ? ?Social History  ? ?Occupational History  ? Occupation: paralegal/nurse tech  ?Tobacco Use  ? Smoking status: Never  ? Smokeless tobacco: Never  ?Vaping Use  ? Vaping Use: Never used  ?Substance and Sexual Activity  ? Alcohol use: Yes  ?  Alcohol/week: 0.0 standard drinks  ? Drug use: No  ? Sexual activity: Not Currently  ?  Partners: Male  ? ? ? ? ? ? ?

## 2022-02-22 NOTE — Telephone Encounter (Signed)
I just wanted to make sure referral was made for this patient for  ? ?"Nerve ablation for right knee pain with Dr. Ernestina Patches." ? ? ?I was unable to make a referral due to the Physiatry button not being there. Since we put in referrals different from you. Lauren F. is working on getting this fixed. ? ?  ?Thank you. ? ? ?

## 2022-03-07 DIAGNOSIS — E039 Hypothyroidism, unspecified: Secondary | ICD-10-CM | POA: Diagnosis not present

## 2022-03-07 DIAGNOSIS — Z79899 Other long term (current) drug therapy: Secondary | ICD-10-CM | POA: Diagnosis not present

## 2022-03-07 DIAGNOSIS — I10 Essential (primary) hypertension: Secondary | ICD-10-CM | POA: Diagnosis not present

## 2022-03-07 DIAGNOSIS — R1031 Right lower quadrant pain: Secondary | ICD-10-CM | POA: Diagnosis not present

## 2022-03-07 DIAGNOSIS — R1032 Left lower quadrant pain: Secondary | ICD-10-CM | POA: Diagnosis not present

## 2022-03-07 DIAGNOSIS — E118 Type 2 diabetes mellitus with unspecified complications: Secondary | ICD-10-CM | POA: Diagnosis not present

## 2022-03-15 ENCOUNTER — Telehealth: Payer: Self-pay | Admitting: Physical Medicine and Rehabilitation

## 2022-03-15 ENCOUNTER — Ambulatory Visit: Payer: 59 | Admitting: Physical Medicine and Rehabilitation

## 2022-03-15 NOTE — Telephone Encounter (Signed)
Patient called needing to R/S her appointment anytime between the 11th and 15th of April.  The number to contact patient is [phone]  ?

## 2022-03-21 ENCOUNTER — Other Ambulatory Visit: Payer: Self-pay | Admitting: Internal Medicine

## 2022-03-21 DIAGNOSIS — S83209A Unspecified tear of unspecified meniscus, current injury, unspecified knee, initial encounter: Secondary | ICD-10-CM

## 2022-03-27 ENCOUNTER — Ambulatory Visit: Payer: 59 | Admitting: Physical Medicine and Rehabilitation

## 2022-04-02 ENCOUNTER — Ambulatory Visit
Admission: RE | Admit: 2022-04-02 | Discharge: 2022-04-02 | Disposition: A | Discharge status: Home / Self Care | Payer: Disability Insurance | Source: Ambulatory Visit | Attending: Internal Medicine | Admitting: Internal Medicine

## 2022-04-02 ENCOUNTER — Ambulatory Visit
Admission: RE | Admit: 2022-04-02 | Discharge: 2022-04-02 | Disposition: A | Discharge status: Home / Self Care | Payer: Disability Insurance | Attending: Internal Medicine | Admitting: Internal Medicine

## 2022-04-02 DIAGNOSIS — S83209A Unspecified tear of unspecified meniscus, current injury, unspecified knee, initial encounter: Secondary | ICD-10-CM

## 2022-04-02 DIAGNOSIS — M1711 Unilateral primary osteoarthritis, right knee: Secondary | ICD-10-CM | POA: Diagnosis not present

## 2022-04-02 DIAGNOSIS — M25461 Effusion, right knee: Secondary | ICD-10-CM | POA: Diagnosis not present

## 2022-04-02 DIAGNOSIS — S83206A Unspecified tear of unspecified meniscus, current injury, right knee, initial encounter: Secondary | ICD-10-CM | POA: Diagnosis not present

## 2022-04-03 ENCOUNTER — Other Ambulatory Visit: Payer: Self-pay | Admitting: Internal Medicine

## 2022-04-03 DIAGNOSIS — R1032 Left lower quadrant pain: Secondary | ICD-10-CM

## 2022-04-06 ENCOUNTER — Ambulatory Visit
Admission: RE | Admit: 2022-04-06 | Discharge: 2022-04-06 | Disposition: A | Discharge status: Home / Self Care | Payer: 59 | Source: Ambulatory Visit | Attending: Internal Medicine | Admitting: Internal Medicine

## 2022-04-06 DIAGNOSIS — I700 Atherosclerosis of aorta: Secondary | ICD-10-CM | POA: Diagnosis not present

## 2022-04-06 DIAGNOSIS — K5730 Diverticulosis of large intestine without perforation or abscess without bleeding: Secondary | ICD-10-CM | POA: Diagnosis not present

## 2022-04-06 DIAGNOSIS — R1032 Left lower quadrant pain: Secondary | ICD-10-CM

## 2022-04-06 DIAGNOSIS — Q631 Lobulated, fused and horseshoe kidney: Secondary | ICD-10-CM | POA: Diagnosis not present

## 2022-04-06 DIAGNOSIS — K449 Diaphragmatic hernia without obstruction or gangrene: Secondary | ICD-10-CM | POA: Diagnosis not present

## 2022-04-06 MED ORDER — IOPAMIDOL (ISOVUE-300) INJECTION 61%
100.0000 mL | Freq: Once | INTRAVENOUS | Status: AC | PRN
  Administered 2022-04-06: 100 mL via INTRAVENOUS

## 2022-04-20 ENCOUNTER — Telehealth: Payer: Self-pay | Admitting: Orthopaedic Surgery

## 2022-04-20 NOTE — Telephone Encounter (Signed)
Patient called in requesting a gel injection wants to make sure she can get it before she sets appointment ?

## 2022-04-25 ENCOUNTER — Telehealth: Payer: Self-pay | Admitting: Orthopaedic Surgery

## 2022-04-25 NOTE — Telephone Encounter (Signed)
VOB submitted for Monovisc, right knee BV pending  

## 2022-04-25 NOTE — Telephone Encounter (Signed)
See previous message in chart. VOB submitted. ? ?

## 2022-04-25 NOTE — Telephone Encounter (Signed)
Called and left a VM for patient to CB about gel injection ?

## 2022-04-25 NOTE — Telephone Encounter (Signed)
Patient called asked if she can be self pay for the gel injections? Patient said in the past she did not have insurance and was allowed to pay $100.00 until she paid for the injections.  Patient said she is going out of town next week on Thursday and wanted to get the injections either Monday, Tuesday or Wednesday. ? ?The number to contact patient is [phone]  ?

## 2022-04-27 ENCOUNTER — Telehealth: Payer: Self-pay

## 2022-04-27 NOTE — Telephone Encounter (Signed)
PA required for Monovisc, right knee. ?Faxed completed PA form to [place] at [phone]. ?

## 2022-05-01 ENCOUNTER — Ambulatory Visit: Payer: 59 | Admitting: Orthopaedic Surgery

## 2022-05-01 ENCOUNTER — Encounter: Payer: Self-pay | Admitting: Orthopaedic Surgery

## 2022-05-01 VITALS — Ht 60.0 in | Wt 217.8 lb

## 2022-05-01 DIAGNOSIS — M1711 Unilateral primary osteoarthritis, right knee: Secondary | ICD-10-CM

## 2022-05-01 MED ORDER — HYALURONAN 88 MG/4ML IX SOSY
88.0000 mg | PREFILLED_SYRINGE | INTRA_ARTICULAR | Status: AC | PRN
  Administered 2022-05-01: 88 mg via INTRA_ARTICULAR

## 2022-05-01 MED ORDER — LIDOCAINE HCL 1 % IJ SOLN
2.0000 mL | INTRAMUSCULAR | Status: AC | PRN
  Administered 2022-05-01: 2 mL

## 2022-05-01 MED ORDER — BUPIVACAINE HCL 0.25 % IJ SOLN
2.0000 mL | INTRAMUSCULAR | Status: AC | PRN
  Administered 2022-05-01: 2 mL via INTRA_ARTICULAR

## 2022-05-01 NOTE — Progress Notes (Signed)
? ?Office Visit Note ?  ?Patient: Natalie Greene           ?Date of Birth: [birth year]           ?MRN: [id number] ?Visit Date: 05/01/2022 ?             ?Requested by: Idelle Crouch, MD ?[facility] ?[place],  [place] [address] ?PCP: Idelle Crouch, MD ? ? ?Assessment & Plan: ?Visit Diagnoses:  ?1. Unilateral primary osteoarthritis, right knee   ? ? ?Plan: Impression is right knee osteoarthritis.  Today, we proceeded with right knee Monovisc injection.  She tolerated this well.  We have also discussed total knee arthroplasty in the future and she is making all efforts at weight loss.  She currently has a BMI of 42.54 and will need to get to a weight of 204 pounds in order to have a BMI of less than 40 which would make her eligible for surgery.  She understands and agrees.  She will follow-up once she has this goal.  Call with concerns or questions in meantime. ? ?Follow-Up Instructions: Return if symptoms worsen or fail to improve.  ? ?Orders:  ?Orders Placed This Encounter  ?Procedures  ? Large Joint Inj: R knee  ? ?No orders of the defined types were placed in this encounter. ? ? ? ? Procedures: ?Large Joint Inj: R knee on 05/01/2022 4:16 PM ?Indications: pain ?Details: 22 G needle, anterolateral approach ?Medications: 2 mL lidocaine 1 %; 2 mL bupivacaine 0.25 %; 88 mg Hyaluronan 88 MG/4ML ? ? ? ? ?Clinical Data: ?No additional findings. ? ? ?Subjective: ?Chief Complaint  ?Patient presents with  ? Right Knee - Injections  ? ? ?HPI patient is a pleasant 51 year old female who comes in today for right knee Monovisc injection.  History of underlying osteoarthritis.  She has had viscosupplementation injections in the past without significant relief. ? ? ? ? ?Objective: ?Vital Signs: Ht 5' (1.524 m)   Wt 217 lb 12.8 oz (98.8 kg)   BMI 42.54 kg/m?  ? ? ? ?Ortho Exam unchanged right knee exam ? ?Specialty Comments:  ?No specialty comments available. ? ?Imaging: ?No new imaging ? ? ?PMFS  History: ?Patient Active Problem List  ? Diagnosis Date Noted  ? Left carpal tunnel syndrome 02/05/2020  ? Right carpal tunnel syndrome 02/05/2020  ? Body mass index 40.0-44.9, adult ([place]) 01/08/2020  ? Morbid obesity ([place]) 01/08/2020  ? Primary osteoarthritis of right knee 01/07/2020  ? TIA (transient ischemic attack) 05/07/2019  ? Vaginitis and vulvovaginitis 04/15/2016  ? Right knee pain 03/09/2016  ? GERD (gastroesophageal reflux disease) 03/09/2016  ? Influenza with respiratory manifestation 02/14/2016  ? H/O cold sores 06/21/2015  ? Diabetes mellitus type II, controlled ([place]) 06/13/2015  ? Hypokalemia 05/25/2015  ? Encounter to establish care 02/07/2015  ? Severe obesity (BMI >= 40) ([place]) 02/07/2015  ? Generalized anxiety disorder 02/07/2015  ? ?Past Medical History:  ?Diagnosis Date  ? Anemia   ? Anxiety   ? Cancer [facility])   ? Constipation   ? Depression   ? Diabetes mellitus without complication ([place])   ? Edema, lower extremity   ? GERD (gastroesophageal reflux disease)   ? Hypertension   ? Joint pain   ? Lactose intolerance   ? Lupus ([place])   ? Osteoarthritis   ? Sleep apnea   ? Squamous cell carcinoma   ? Stroke [facility])   ? Stroke in Jan-2020 / TIA -April 2020  ?  ?  Family History  ?Problem Relation Age of Onset  ? Cancer Father   ?     colon and prostate  ? Diabetes Father   ? Breast cancer Other   ? Depression Mother   ? Anxiety disorder Mother   ?  ?Past Surgical History:  ?Procedure Laterality Date  ? ABDOMINAL HYSTERECTOMY    ? approx 5 years ago from 2017   ? CHOLECYSTECTOMY    ? WISDOM TOOTH EXTRACTION    ? ?Social History  ? ?Occupational History  ? Occupation: paralegal/nurse tech  ?Tobacco Use  ? Smoking status: Never  ? Smokeless tobacco: Never  ?Vaping Use  ? Vaping Use: Never used  ?Substance and Sexual Activity  ? Alcohol use: Yes  ?  Alcohol/week: 0.0 standard drinks  ? Drug use: No  ? Sexual activity: Not Currently  ?  Partners: Male  ? ? ? ? ? ? ?

## 2022-05-03 ENCOUNTER — Other Ambulatory Visit: Payer: Self-pay

## 2022-05-03 DIAGNOSIS — M1711 Unilateral primary osteoarthritis, right knee: Secondary | ICD-10-CM

## 2022-05-11 ENCOUNTER — Ambulatory Visit: Payer: 59 | Admitting: Orthopaedic Surgery

## 2022-05-11 ENCOUNTER — Ambulatory Visit (INDEPENDENT_AMBULATORY_CARE_PROVIDER_SITE_OTHER): Payer: 59

## 2022-05-11 ENCOUNTER — Encounter: Payer: Self-pay | Admitting: Orthopaedic Surgery

## 2022-05-11 VITALS — Ht 60.0 in | Wt 215.0 lb

## 2022-05-11 DIAGNOSIS — M1711 Unilateral primary osteoarthritis, right knee: Secondary | ICD-10-CM

## 2022-05-11 DIAGNOSIS — M25562 Pain in left knee: Secondary | ICD-10-CM

## 2022-05-11 DIAGNOSIS — Z6841 Body Mass Index (BMI) 40.0 and over, adult: Secondary | ICD-10-CM | POA: Diagnosis not present

## 2022-05-11 MED ORDER — BUPIVACAINE HCL 0.5 % IJ SOLN
2.0000 mL | INTRAMUSCULAR | Status: AC | PRN
  Administered 2022-05-11: 2 mL via INTRA_ARTICULAR

## 2022-05-11 MED ORDER — LIDOCAINE HCL 1 % IJ SOLN
2.0000 mL | INTRAMUSCULAR | Status: AC | PRN
  Administered 2022-05-11: 2 mL

## 2022-05-11 MED ORDER — METHYLPREDNISOLONE ACETATE 40 MG/ML IJ SUSP
40.0000 mg | INTRAMUSCULAR | Status: AC | PRN
  Administered 2022-05-11: 40 mg via INTRA_ARTICULAR

## 2022-05-11 NOTE — Progress Notes (Signed)
Office Visit Note   Patient: Natalie Greene           Date of Birth: 1971-10-17           MRN: [id number] Visit Date: 05/11/2022              Requested by: Idelle Crouch, MD [facility] [place],  [place] [address] PCP: Idelle Crouch, MD   Assessment & Plan: Visit Diagnoses:  1. Acute pain of left knee   2. Primary osteoarthritis of right knee   3. Body mass index 40.0-44.9, adult ([place])   4. Morbid obesity ([place])     Plan: Impression is acute left knee pain etiology medial meniscal tear versus arthritis exacerbation.  Based on options she elected to do a cortisone injection today.  If she does not feel significant relief she will let me know in about 6 weeks so that we can get an MRI.  Follow-Up Instructions: No follow-ups on file.   Orders:  Orders Placed This Encounter  Procedures   Large Joint Inj: L knee   XR KNEE 3 VIEW LEFT   No orders of the defined types were placed in this encounter.     Procedures: Large Joint Inj: L knee on 05/11/2022 10:31 AM Details: 22 G needle Medications: 2 mL bupivacaine 0.5 %; 2 mL lidocaine 1 %; 40 mg methylPREDNISolone acetate 40 MG/ML Outcome: tolerated well, no immediate complications Patient was prepped and draped in the usual sterile fashion.      Clinical Data: No additional findings.   Subjective: Chief Complaint  Patient presents with   Left Knee - Pain    HPI Rolande comes in today for evaluation of acute left knee pain that started last week after she was at the beach.  She did a lot of walking.  Denies any specific injuries or trauma.  Has pain to the medial side.  Has trouble weightbearing due to the pain.  Review of Systems  Constitutional: Negative.   HENT: Negative.    Eyes: Negative.   Respiratory: Negative.    Cardiovascular: Negative.   Endocrine: Negative.   Musculoskeletal: Negative.   Neurological: Negative.   Hematological: Negative.   Psychiatric/Behavioral:  Negative.    All other systems reviewed and are negative.   Objective: Vital Signs: Ht 5' (1.524 m)   Wt 215 lb (97.5 kg)   BMI 41.99 kg/m   Physical Exam Vitals and nursing note reviewed.  Constitutional:      Appearance: She is well-developed.  Pulmonary:     Effort: Pulmonary effort is normal.  Skin:    General: Skin is warm.     Capillary Refill: Capillary refill takes less than 2 seconds.  Neurological:     Mental Status: She is alert and oriented to person, place, and time.  Psychiatric:        Behavior: Behavior normal.        Thought Content: Thought content normal.        Judgment: Judgment normal.    Ortho Exam Examination left knee shows medial joint line tenderness.  Negative [place] and no effusion.  Range of motion preserved.  Collaterals and cruciates are stable. Specialty Comments:  No specialty comments available.  Imaging: XR KNEE 3 VIEW LEFT  Result Date: 05/11/2022 Mild tricompartmental osteoarthritis with periarticular spurring.    PMFS History: Patient Active Problem List   Diagnosis Date Noted   Acute pain of left knee 05/11/2022   Left  carpal tunnel syndrome 02/05/2020   Right carpal tunnel syndrome 02/05/2020   Body mass index 40.0-44.9, adult ([place]) 01/08/2020   Morbid obesity ([place]) 01/08/2020   Primary osteoarthritis of right knee 01/07/2020   TIA (transient ischemic attack) 05/07/2019   Vaginitis and vulvovaginitis 04/15/2016   Right knee pain 03/09/2016   GERD (gastroesophageal reflux disease) 03/09/2016   Influenza with respiratory manifestation 02/14/2016   H/O cold sores 06/21/2015   Diabetes mellitus type II, controlled ([place]) 06/13/2015   Hypokalemia 05/25/2015   Encounter to establish care 02/07/2015   Severe obesity (BMI >= 40) ([place]) 02/07/2015   Generalized anxiety disorder 02/07/2015   Past Medical History:  Diagnosis Date   Anemia    Anxiety    Cancer ([place])    Constipation    Depression    Diabetes mellitus  without complication (HCC)    Edema, lower extremity    GERD (gastroesophageal reflux disease)    Hypertension    Joint pain    Lactose intolerance    Lupus ([place])    Osteoarthritis    Sleep apnea    Squamous cell carcinoma    Stroke ([place])    Stroke in Jan-2020 / TIA -April 2020    Family History  Problem Relation Age of Onset   Cancer Father        colon and prostate   Diabetes Father    Breast cancer Other    Depression Mother    Anxiety disorder Mother     Past Surgical History:  Procedure Laterality Date   ABDOMINAL HYSTERECTOMY     approx 5 years ago from 2017    CHOLECYSTECTOMY     WISDOM TOOTH EXTRACTION     Social History   Occupational History   Occupation: Chiropodist  Tobacco Use   Smoking status: Never   Smokeless tobacco: Never  Vaping Use   Vaping Use: Never used  Substance and Sexual Activity   Alcohol use: Yes    Alcohol/week: 0.0 standard drinks   Drug use: No   Sexual activity: Not Currently    Partners: Male

## 2022-05-15 DIAGNOSIS — E785 Hyperlipidemia, unspecified: Secondary | ICD-10-CM | POA: Diagnosis not present

## 2022-05-15 DIAGNOSIS — E1169 Type 2 diabetes mellitus with other specified complication: Secondary | ICD-10-CM | POA: Diagnosis not present

## 2022-05-22 DIAGNOSIS — E1169 Type 2 diabetes mellitus with other specified complication: Secondary | ICD-10-CM | POA: Diagnosis not present

## 2022-05-22 DIAGNOSIS — E785 Hyperlipidemia, unspecified: Secondary | ICD-10-CM | POA: Diagnosis not present

## 2022-06-08 DIAGNOSIS — Z1231 Encounter for screening mammogram for malignant neoplasm of breast: Secondary | ICD-10-CM | POA: Diagnosis not present

## 2022-06-08 DIAGNOSIS — E118 Type 2 diabetes mellitus with unspecified complications: Secondary | ICD-10-CM | POA: Diagnosis not present

## 2022-06-08 DIAGNOSIS — E782 Mixed hyperlipidemia: Secondary | ICD-10-CM | POA: Diagnosis not present

## 2022-06-08 DIAGNOSIS — I10 Essential (primary) hypertension: Secondary | ICD-10-CM | POA: Diagnosis not present

## 2022-06-08 DIAGNOSIS — E039 Hypothyroidism, unspecified: Secondary | ICD-10-CM | POA: Diagnosis not present

## 2022-06-08 DIAGNOSIS — M25561 Pain in right knee: Secondary | ICD-10-CM | POA: Diagnosis not present

## 2022-06-08 DIAGNOSIS — G8929 Other chronic pain: Secondary | ICD-10-CM | POA: Diagnosis not present

## 2022-06-08 DIAGNOSIS — Z79899 Other long term (current) drug therapy: Secondary | ICD-10-CM | POA: Diagnosis not present

## 2022-06-27 ENCOUNTER — Telehealth: Payer: Self-pay | Admitting: Orthopaedic Surgery

## 2022-06-27 NOTE — Telephone Encounter (Signed)
Yes we can get approval  thanks.

## 2022-06-27 NOTE — Telephone Encounter (Signed)
Patient called asked if she can get a gel injection in her left knee? Patient said she is hurting pretty bad in both of her knees.  The number to contact patient is [phone]

## 2022-06-28 ENCOUNTER — Telehealth: Payer: Self-pay

## 2022-06-28 NOTE — Telephone Encounter (Signed)
Notified patient that we sent in for precert.

## 2022-06-28 NOTE — Telephone Encounter (Signed)
Noted  

## 2022-06-28 NOTE — Telephone Encounter (Signed)
Please precert for left knee gel injection. This is Dr.Xu's patient.

## 2022-07-10 NOTE — Telephone Encounter (Signed)
Submited for VOB on [email or web address] 

## 2022-07-25 ENCOUNTER — Encounter (INDEPENDENT_AMBULATORY_CARE_PROVIDER_SITE_OTHER): Payer: Self-pay

## 2022-07-25 ENCOUNTER — Other Ambulatory Visit: Payer: Self-pay

## 2022-07-25 DIAGNOSIS — M25562 Pain in left knee: Secondary | ICD-10-CM

## 2022-07-30 DIAGNOSIS — M1712 Unilateral primary osteoarthritis, left knee: Secondary | ICD-10-CM | POA: Diagnosis not present

## 2022-07-30 DIAGNOSIS — M1711 Unilateral primary osteoarthritis, right knee: Secondary | ICD-10-CM | POA: Diagnosis not present

## 2022-07-30 NOTE — Progress Notes (Unsigned)
Office Visit Note   Patient: Natalie Greene           Date of Birth: 1971-12-13           MRN: [id number] Visit Date: 07/31/2022              Requested by: Idelle Crouch, MD [facility] [place],  [place] [address] PCP: Idelle Crouch, MD   Assessment & Plan: Visit Diagnoses:  1. Primary osteoarthritis of left knee   2. Unilateral primary osteoarthritis, right knee     Plan: Impression is bilateral knee osteoarthritis left greater than right.  Today, proceed with left knee Monovisc injection and right knee cortisone injection.  She tolerated these well.  She will follow-up with [place] as needed.  Call with concerns or questions in the meantime. Monovisc injection lot #[id number], expiration date 12/16/2024  Follow-Up Instructions: No follow-ups on file.   Orders:  Orders Placed This Encounter  Procedures   Large Joint Inj: L knee   No orders of the defined types were placed in this encounter.     Procedures: Large Joint Inj: L knee on 07/30/2022 4:30 PM Indications: pain Details: 22 G needle  Arthrogram: No  Medications: 88 mg Hyaluronan 88 MG/4ML Outcome: tolerated well, no immediate complications Patient was prepped and draped in the usual sterile fashion.    Large Joint Inj: R knee on 07/31/2022 2:08 PM Indications: pain Details: 22 G needle, anterolateral approach Medications: 2 mL lidocaine 1 %; 2 mL bupivacaine 0.25 %; 40 mg methylPREDNISolone acetate 40 MG/ML     Clinical Data: No additional findings.   Subjective: Chief Complaint  Patient presents with   Left Knee - Follow-up    monovisc   Right Knee - Pain    HPI-year-old female who comes in today for left knee Monovisc injection and right knee cortisone injection.  History of bilateral knee osteoarthritis left worse than right.  She has undergone cortisone as well as viscosupplementation injections in the past with about 1 months relief from  each.     Objective: Vital Signs: There were no vitals taken for this visit.    Ortho Exam stable bilateral knee exams  Specialty Comments:  No specialty comments available.  Imaging: No new imaging   PMFS History: Patient Active Problem List   Diagnosis Date Noted   Acute pain of left knee 05/11/2022   Left carpal tunnel syndrome 02/05/2020   Right carpal tunnel syndrome 02/05/2020   Body mass index 40.0-44.9, adult ([place]) 01/08/2020   Morbid obesity ([place]) 01/08/2020   Primary osteoarthritis of right knee 01/07/2020   TIA (transient ischemic attack) 05/07/2019   Vaginitis and vulvovaginitis 04/15/2016   Right knee pain 03/09/2016   GERD (gastroesophageal reflux disease) 03/09/2016   Influenza with respiratory manifestation 02/14/2016   H/O cold sores 06/21/2015   Diabetes mellitus type II, controlled ([place]) 06/13/2015   Hypokalemia 05/25/2015   Encounter to establish care 02/07/2015   Severe obesity (BMI >= 40) ([place]) 02/07/2015   Generalized anxiety disorder 02/07/2015   Past Medical History:  Diagnosis Date   Anemia    Anxiety    Cancer ([place])    Constipation    Depression    Diabetes mellitus without complication (HCC)    Edema, lower extremity    GERD (gastroesophageal reflux disease)    Hypertension    Joint pain    Lactose intolerance    Lupus ([place])    Osteoarthritis  Sleep apnea    Squamous cell carcinoma    Stroke [facility])    Stroke in Jan-2020 / TIA -April 2020    Family History  Problem Relation Age of Onset   Cancer Father        colon and prostate   Diabetes Father    Breast cancer Other    Depression Mother    Anxiety disorder Mother     Past Surgical History:  Procedure Laterality Date   ABDOMINAL HYSTERECTOMY     approx 5 years ago from 2017    CHOLECYSTECTOMY     WISDOM TOOTH EXTRACTION     Social History   Occupational History   Occupation: paralegal/nurse tech  Tobacco Use   Smoking status: Never   Smokeless tobacco:  Never  Vaping Use   Vaping Use: Never used  Substance and Sexual Activity   Alcohol use: Yes    Alcohol/week: 0.0 standard drinks of alcohol   Drug use: No   Sexual activity: Not Currently    Partners: Male

## 2022-07-31 ENCOUNTER — Ambulatory Visit: Payer: 59 | Admitting: Orthopaedic Surgery

## 2022-07-31 ENCOUNTER — Encounter: Payer: Self-pay | Admitting: Orthopaedic Surgery

## 2022-07-31 DIAGNOSIS — M1711 Unilateral primary osteoarthritis, right knee: Secondary | ICD-10-CM | POA: Diagnosis not present

## 2022-07-31 DIAGNOSIS — M1712 Unilateral primary osteoarthritis, left knee: Secondary | ICD-10-CM

## 2022-07-31 MED ORDER — METHYLPREDNISOLONE ACETATE 40 MG/ML IJ SUSP
40.0000 mg | INTRAMUSCULAR | Status: AC | PRN
  Administered 2022-07-31: 40 mg via INTRA_ARTICULAR

## 2022-07-31 MED ORDER — HYALURONAN 88 MG/4ML IX SOSY
88.0000 mg | PREFILLED_SYRINGE | INTRA_ARTICULAR | Status: AC | PRN
  Administered 2022-07-30: 88 mg via INTRA_ARTICULAR

## 2022-07-31 MED ORDER — BUPIVACAINE HCL 0.25 % IJ SOLN
2.0000 mL | INTRAMUSCULAR | Status: AC | PRN
  Administered 2022-07-31: 2 mL via INTRA_ARTICULAR

## 2022-07-31 MED ORDER — LIDOCAINE HCL 1 % IJ SOLN
2.0000 mL | INTRAMUSCULAR | Status: AC | PRN
  Administered 2022-07-31: 2 mL

## 2022-09-06 DIAGNOSIS — E782 Mixed hyperlipidemia: Secondary | ICD-10-CM | POA: Diagnosis not present

## 2022-09-06 DIAGNOSIS — Z79899 Other long term (current) drug therapy: Secondary | ICD-10-CM | POA: Diagnosis not present

## 2022-09-06 DIAGNOSIS — E118 Type 2 diabetes mellitus with unspecified complications: Secondary | ICD-10-CM | POA: Diagnosis not present

## 2022-09-11 DIAGNOSIS — E785 Hyperlipidemia, unspecified: Secondary | ICD-10-CM | POA: Diagnosis not present

## 2022-09-11 DIAGNOSIS — E1169 Type 2 diabetes mellitus with other specified complication: Secondary | ICD-10-CM | POA: Diagnosis not present

## 2022-09-13 DIAGNOSIS — Z1231 Encounter for screening mammogram for malignant neoplasm of breast: Secondary | ICD-10-CM | POA: Diagnosis not present

## 2022-09-13 DIAGNOSIS — Z800 Family history of malignant neoplasm of digestive organs: Secondary | ICD-10-CM | POA: Diagnosis not present

## 2022-09-13 DIAGNOSIS — N951 Menopausal and female climacteric states: Secondary | ICD-10-CM | POA: Diagnosis not present

## 2022-09-13 DIAGNOSIS — F411 Generalized anxiety disorder: Secondary | ICD-10-CM | POA: Diagnosis not present

## 2022-09-13 DIAGNOSIS — Z23 Encounter for immunization: Secondary | ICD-10-CM | POA: Diagnosis not present

## 2022-09-13 DIAGNOSIS — I10 Essential (primary) hypertension: Secondary | ICD-10-CM | POA: Diagnosis not present

## 2022-09-13 DIAGNOSIS — E039 Hypothyroidism, unspecified: Secondary | ICD-10-CM | POA: Diagnosis not present

## 2022-09-13 DIAGNOSIS — E118 Type 2 diabetes mellitus with unspecified complications: Secondary | ICD-10-CM | POA: Diagnosis not present

## 2022-09-13 DIAGNOSIS — E782 Mixed hyperlipidemia: Secondary | ICD-10-CM | POA: Diagnosis not present

## 2022-09-14 ENCOUNTER — Other Ambulatory Visit: Payer: Self-pay | Admitting: Internal Medicine

## 2022-09-14 DIAGNOSIS — Z1231 Encounter for screening mammogram for malignant neoplasm of breast: Secondary | ICD-10-CM

## 2022-09-18 ENCOUNTER — Emergency Department: Payer: 59

## 2022-09-18 ENCOUNTER — Emergency Department
Admission: EM | Admit: 2022-09-18 | Discharge: 2022-09-18 | Disposition: A | Discharge status: Home / Self Care | Payer: 59 | Attending: Emergency Medicine | Admitting: Emergency Medicine

## 2022-09-18 ENCOUNTER — Encounter: Payer: Self-pay | Admitting: Emergency Medicine

## 2022-09-18 DIAGNOSIS — R519 Headache, unspecified: Secondary | ICD-10-CM | POA: Diagnosis not present

## 2022-09-18 DIAGNOSIS — R0789 Other chest pain: Secondary | ICD-10-CM | POA: Insufficient documentation

## 2022-09-18 DIAGNOSIS — I10 Essential (primary) hypertension: Secondary | ICD-10-CM | POA: Diagnosis not present

## 2022-09-18 DIAGNOSIS — M5412 Radiculopathy, cervical region: Secondary | ICD-10-CM | POA: Diagnosis not present

## 2022-09-18 DIAGNOSIS — R079 Chest pain, unspecified: Secondary | ICD-10-CM | POA: Diagnosis not present

## 2022-09-18 DIAGNOSIS — M79602 Pain in left arm: Secondary | ICD-10-CM | POA: Diagnosis not present

## 2022-09-18 DIAGNOSIS — E119 Type 2 diabetes mellitus without complications: Secondary | ICD-10-CM | POA: Diagnosis not present

## 2022-09-18 LAB — TROPONIN I (HIGH SENSITIVITY)
Troponin I (High Sensitivity): 3 ng/L (ref <18)
Troponin I (High Sensitivity): 4 ng/L (ref <18)

## 2022-09-18 LAB — BASIC METABOLIC PANEL
Anion gap: 6 (ref 5–15)
BUN: 13 mg/dL (ref 6–20)
CO2: 28 mmol/L (ref 22–32)
Calcium: 8.9 mg/dL (ref 8.9–10.3)
Chloride: 104 mmol/L (ref 98–111)
Creatinine, Ser: 0.65 mg/dL (ref 0.44–1.00)
GFR, Estimated: >60 mL/min (ref >60)
Glucose, Bld: 90 mg/dL (ref 70–99)
Potassium: 4 mmol/L (ref 3.5–5.1)
Sodium: 138 mmol/L (ref 135–145)

## 2022-09-18 LAB — CBC
HCT: 35.2 % — ABNORMAL LOW (ref 36.0–46.0)
Hemoglobin: 11.2 g/dL — ABNORMAL LOW (ref 12.0–15.0)
MCH: 26.9 pg (ref 26.0–34.0)
MCHC: 31.8 g/dL (ref 30.0–36.0)
MCV: 84.6 fL (ref 80.0–100.0)
Platelets: 273 10*3/uL (ref 150–400)
RBC: 4.16 MIL/uL (ref 3.87–5.11)
RDW: 14.3 % (ref 11.5–15.5)
WBC: 7.6 10*3/uL (ref 4.0–10.5)
nRBC: 0 % (ref 0.0–0.2)

## 2022-09-18 MED ORDER — CYCLOBENZAPRINE HCL 10 MG PO TABS: 10.0000 mg | ORAL_TABLET | Freq: Three times a day (TID) | ORAL | 0 refills | Status: AC | PRN

## 2022-09-18 MED ORDER — CYCLOBENZAPRINE HCL 10 MG PO TABS
10.0000 mg | ORAL_TABLET | Freq: Once | ORAL | Status: AC
  Administered 2022-09-18: 10 mg via ORAL
  Filled 2022-09-18: qty 1

## 2022-09-18 MED ORDER — IBUPROFEN 600 MG PO TABS: 600.0000 mg | ORAL_TABLET | Freq: Four times a day (QID) | ORAL | 0 refills | Status: AC | PRN

## 2022-09-18 MED ORDER — IBUPROFEN 600 MG PO TABS
600.0000 mg | ORAL_TABLET | Freq: Once | ORAL | Status: AC
  Administered 2022-09-18: 600 mg via ORAL
  Filled 2022-09-18: qty 1

## 2022-09-18 NOTE — ED Triage Notes (Signed)
Pt presents via POV with complaints of left sided CP that started yesterday afternoon but has gotten progressively worse over the last few hours waking her up from sleeping due to the pain. Pt states the pain is "sharp/heavy in nature".  No meds taken PTA - Hx of CVA - residual left sided weakness. Denies fevers, N/V, SOB.

## 2022-09-18 NOTE — ED Provider Notes (Signed)
[facility] Provider Note    Event Date/Time   First MD Initiated Contact with Patient 09/18/22 [phone]     (approximate)   History   Chest Pain   HPI  Natalie Greene is a 51 y.o. female with a history of diabetes, hypertension, CVA, sleep apnea, and anxiety who presents with left chest and arm pain over the last couple of hours that she awoke with.  The pain is somewhat sharp and is currently mainly in the left upper arm.  She feels more intense pain when she bends or moves arm.  She denies any numbness but feels some weakness in grip in the left hand.  She states that earlier she had some chest discomfort without the arm pain.  She also had an episode where she had floaters in her left eye but did not lose her vision.  She had some floaters and vision loss during her prior stroke so she became concerned.  She states her vision is currently back to normal.     Physical Exam   Triage Vital Signs: ED Triage Vitals  Enc Vitals Group     BP 09/18/22 0154 (!) 121/106     Pulse Rate 09/18/22 0154 68     Resp 09/18/22 0154 18     Temp 09/18/22 0154 97.8 F (36.6 C)     Temp Source 09/18/22 0154 Oral     SpO2 09/18/22 0154 100 %     Weight 09/18/22 0148 216 lb 0.8 oz (98 kg)     Height 09/18/22 0148 5' (1.524 m)     Head Circumference --      Peak Flow --      Pain Score --      Pain Loc --      Pain Edu? --      Excl. in [place]? --     Most recent vital signs: Vitals:   09/18/22 0300 09/18/22 0330  BP: 130/71 119/66  Pulse: (!) 58 62  Resp: 20 12  Temp:    SpO2: 98% 100%     General: Awake, no distress.  CV:  Good peripheral perfusion.  Resp:  Normal effort.  Abd:  No distention.  Other:  EOMI.  PERRLA.  No facial droop.  Motor intact in all extremities except for very mildly decreased grip strength in LUE.  Normal coordination with no ataxia.  No swelling or tenderness to left arm.  No midline cervical spinal tenderness.   ED Results /  Procedures / Treatments   Labs (all labs ordered are listed, but only abnormal results are displayed) Labs Reviewed  CBC - Abnormal; Notable for the following components:      Result Value   Hemoglobin 11.2 (*)    HCT 35.2 (*)    All other components within normal limits  BASIC METABOLIC PANEL  TROPONIN I (HIGH SENSITIVITY)  TROPONIN I (HIGH SENSITIVITY)     EKG  ED ECG REPORT I, Arta Silence, the attending physician, personally viewed and interpreted this ECG.  Date: 09/18/2022 EKG Time: 0153 Rate: 72 Rhythm: normal sinus rhythm QRS Axis: Left axis Intervals: normal ST/T Wave abnormalities: normal Narrative Interpretation: no evidence of acute ischemia; no significant change when compared to EKG of 12/21/2020    RADIOLOGY  Chest x-ray: I independently viewed and interpreted the images; there is no focal consolidation or edema  MR brain: No acute findings  MR cervical spine: No acute findings  PROCEDURES:  Critical Care performed:  No  Procedures   MEDICATIONS ORDERED IN ED: Medications  ibuprofen (ADVIL) tablet 600 mg (600 mg Oral Given 09/18/22 0329)  cyclobenzaprine (FLEXERIL) tablet 10 mg (10 mg Oral Given 09/18/22 0329)     IMPRESSION / MDM / ASSESSMENT AND PLAN / ED COURSE  I reviewed the triage vital signs and the nursing notes.  51 year old female with PMH as noted above presents with primarily left arm pain as well as some chest discomfort; the arm pain is reproducible with certain movements.  On exam the patient has mildly decreased grip strength, possibly just due to pain, but no other neuro deficits.  I reviewed the past medical records.  The patient was most recently admitted in 2020.  Per the hospitalist discharge summary from 05/08/2019 she presented with left-sided weakness, slurred speech, blurry vision.  MRI/MRA of the brain were negative and she was diagnosed with TIA.  Differential diagnosis includes, but is not limited to, cervical  radiculopathy, other neuropathic pain, less likely ACS.  I do not suspect TIA or CVA given the predominance of pain as the main symptom.  We will obtain basic labs, cardiac enzymes x2, MRI of the cervical spine and reassess.  Given that the patient is already getting MRI of the cervical spine, my suspicion for CNS cause is low we will obtain MRI of the brain as well.  Patient's presentation is most consistent with acute presentation with potential threat to life or bodily function.  The patient is on the cardiac monitor to evaluate for evidence of arrhythmia and/or significant heart rate changes.  ----------------------------------------- 6:46 AM on 09/18/2022 -----------------------------------------  Troponins are negative x2.  Electrolytes are unremarkable.  There is no leukocytosis or any other concerning finding on the CBC.  Chest x-ray shows no acute findings.  MRI of the brain and cervical spine are negative for acute findings although the patient does have some cervical spinal stenosis and foraminal narrowing.  On reassessment, the patient reports significant relief of her symptoms after ibuprofen and Flexeril.  Overall presentation is consistent with muscle spasm/strain versus neuropathic pain.  There is no evidence of cardiac etiology.  There are no significant neuro deficits or evidence of CNS cause.  The patient is stable for discharge home.  I counseled her on the results of the work-up.  I recommend that she follow-up with her PMD.  I have prescribed Flexeril and ibuprofen.  Return precautions given, and she expresses understanding.   FINAL CLINICAL IMPRESSION(S) / ED DIAGNOSES   Final diagnoses:  Left arm pain     Rx / DC Orders   ED Discharge Orders          Ordered    cyclobenzaprine (FLEXERIL) 10 MG tablet  3 times daily PRN        09/18/22 0644    ibuprofen (ADVIL) 600 MG tablet  Every 6 hours PRN        09/18/22 0644             Note:  This document  was prepared using Dragon voice recognition software and may include unintentional dictation errors.    Arta Silence, MD 09/18/22 [phone]

## 2022-09-18 NOTE — ED Notes (Signed)
Patient taken to MRI at this time 

## 2022-09-18 NOTE — Discharge Instructions (Signed)
Your arm and chest wall pain are most likely from a muscle strain or spasm or possibly nerve pain.  Take the ibuprofen every 6 hours with meals to decrease inflammation and the Flexeril up to 3 times daily.  Follow-up with your regular doctor within the next week.  Return to the ER immediately for new, worsening, or persistent severe arm or chest pain, weakness or numbness, difficulty breathing, lightheadedness, or any other new or worsening symptoms that concern you.

## 2022-10-10 DIAGNOSIS — B359 Dermatophytosis, unspecified: Secondary | ICD-10-CM | POA: Diagnosis not present

## 2022-10-10 DIAGNOSIS — L989 Disorder of the skin and subcutaneous tissue, unspecified: Secondary | ICD-10-CM | POA: Diagnosis not present

## 2022-10-19 ENCOUNTER — Ambulatory Visit
Admission: RE | Admit: 2022-10-19 | Discharge: 2022-10-19 | Disposition: A | Discharge status: Home / Self Care | Payer: 59 | Source: Ambulatory Visit | Attending: Internal Medicine | Admitting: Internal Medicine

## 2022-10-19 DIAGNOSIS — Z1231 Encounter for screening mammogram for malignant neoplasm of breast: Secondary | ICD-10-CM | POA: Diagnosis not present

## 2022-12-07 ENCOUNTER — Telehealth: Payer: Self-pay | Admitting: Orthopaedic Surgery

## 2022-12-07 NOTE — Telephone Encounter (Signed)
Pt called requesting to send to insurance company for sinvisc gel injection in both knee. Please submit after 12/21/2021. Pt phone number is [phone].

## 2022-12-18 DIAGNOSIS — E785 Hyperlipidemia, unspecified: Secondary | ICD-10-CM | POA: Diagnosis not present

## 2022-12-18 DIAGNOSIS — N951 Menopausal and female climacteric states: Secondary | ICD-10-CM | POA: Diagnosis not present

## 2022-12-18 DIAGNOSIS — E1169 Type 2 diabetes mellitus with other specified complication: Secondary | ICD-10-CM | POA: Diagnosis not present

## 2022-12-20 NOTE — Telephone Encounter (Signed)
Talked with patient concerning gel injection for bilateral knee.  Per patient, she would like to have gel injection for right knee and cortisone for left knee.   Next available gel injection for Monovisc, left knee will need to be after 01/31/2023. VOB submitted for Monovisc, right knee

## 2022-12-25 DIAGNOSIS — Z1211 Encounter for screening for malignant neoplasm of colon: Secondary | ICD-10-CM | POA: Diagnosis not present

## 2022-12-25 DIAGNOSIS — Z6839 Body mass index (BMI) 39.0-39.9, adult: Secondary | ICD-10-CM | POA: Diagnosis not present

## 2022-12-25 DIAGNOSIS — E1169 Type 2 diabetes mellitus with other specified complication: Secondary | ICD-10-CM | POA: Diagnosis not present

## 2022-12-25 DIAGNOSIS — G8929 Other chronic pain: Secondary | ICD-10-CM | POA: Diagnosis not present

## 2022-12-25 DIAGNOSIS — E118 Type 2 diabetes mellitus with unspecified complications: Secondary | ICD-10-CM | POA: Diagnosis not present

## 2022-12-25 DIAGNOSIS — E039 Hypothyroidism, unspecified: Secondary | ICD-10-CM | POA: Diagnosis not present

## 2022-12-25 DIAGNOSIS — M25561 Pain in right knee: Secondary | ICD-10-CM | POA: Diagnosis not present

## 2022-12-25 DIAGNOSIS — E782 Mixed hyperlipidemia: Secondary | ICD-10-CM | POA: Diagnosis not present

## 2022-12-25 DIAGNOSIS — Z79899 Other long term (current) drug therapy: Secondary | ICD-10-CM | POA: Diagnosis not present

## 2022-12-25 DIAGNOSIS — I10 Essential (primary) hypertension: Secondary | ICD-10-CM | POA: Diagnosis not present

## 2022-12-25 DIAGNOSIS — E785 Hyperlipidemia, unspecified: Secondary | ICD-10-CM | POA: Diagnosis not present

## 2022-12-28 ENCOUNTER — Other Ambulatory Visit: Payer: Self-pay

## 2022-12-28 DIAGNOSIS — M1711 Unilateral primary osteoarthritis, right knee: Secondary | ICD-10-CM

## 2023-01-01 ENCOUNTER — Encounter: Payer: Self-pay | Admitting: Orthopaedic Surgery

## 2023-01-01 ENCOUNTER — Ambulatory Visit: Payer: 59 | Admitting: Orthopaedic Surgery

## 2023-01-01 DIAGNOSIS — M1711 Unilateral primary osteoarthritis, right knee: Secondary | ICD-10-CM

## 2023-01-01 DIAGNOSIS — M1712 Unilateral primary osteoarthritis, left knee: Secondary | ICD-10-CM | POA: Diagnosis not present

## 2023-01-01 MED ORDER — METHYLPREDNISOLONE ACETATE 40 MG/ML IJ SUSP
40.0000 mg | INTRAMUSCULAR | Status: AC | PRN
  Administered 2023-01-01: 40 mg via INTRA_ARTICULAR

## 2023-01-01 MED ORDER — LIDOCAINE HCL 1 % IJ SOLN
2.0000 mL | INTRAMUSCULAR | Status: AC | PRN
  Administered 2023-01-01: 2 mL

## 2023-01-01 MED ORDER — HYALURONAN 88 MG/4ML IX SOSY
88.0000 mg | PREFILLED_SYRINGE | INTRA_ARTICULAR | Status: AC | PRN
  Administered 2023-01-01: 88 mg via INTRA_ARTICULAR

## 2023-01-01 MED ORDER — BUPIVACAINE HCL 0.5 % IJ SOLN
2.0000 mL | INTRAMUSCULAR | Status: AC | PRN
  Administered 2023-01-01: 2 mL via INTRA_ARTICULAR

## 2023-01-01 NOTE — Progress Notes (Signed)
Office Visit Note   Patient: Natalie Greene           Date of Birth: Sep 20, 1971           MRN: [id number] Visit Date: 01/01/2023              Requested by: Natalie Crouch, MD [facility] [place],  [place] [address] PCP: Natalie Crouch, MD   Assessment & Plan: Visit Diagnoses:  1. Primary osteoarthritis of right knee   2. Primary osteoarthritis of left knee     Plan: Impression bilateral knee osteoarthritis.  Right knee injected with Monovisc and left knee injected with steroid today.  Follow-up as needed.  Follow-Up Instructions: No follow-ups on file.   Orders:  No orders of the defined types were placed in this encounter.  No orders of the defined types were placed in this encounter.     Procedures: Large Joint Inj: R knee on 01/01/2023 9:00 AM Indications: pain Details: 22 G needle  Arthrogram: No  Medications: 88 mg Hyaluronan 88 MG/4ML Outcome: tolerated well, no immediate complications Patient was prepped and draped in the usual sterile fashion.    Large Joint Inj: L knee on 01/01/2023 9:00 AM Details: 22 G needle Medications: 2 mL bupivacaine 0.5 %; 2 mL lidocaine 1 %; 40 mg methylPREDNISolone acetate 40 MG/ML Outcome: tolerated well, no immediate complications Patient was prepped and draped in the usual sterile fashion.       Clinical Data: No additional findings.   Subjective: Chief Complaint  Patient presents with   Right Knee - Follow-up    Monovisc Injection   Left Knee - Pain    HPI Natalie Greene follows up today for bilateral knee osteoarthritis injections.  Review of Systems   Objective: Vital Signs: There were no vitals taken for this visit.  Physical Exam  Ortho Exam Knee exam stable Specialty Comments:  No specialty comments available.  Imaging: No results found.   PMFS History: Patient Active Problem List   Diagnosis Date Noted   Primary osteoarthritis of left knee 01/01/2023   Acute  pain of left knee 05/11/2022   Left carpal tunnel syndrome 02/05/2020   Right carpal tunnel syndrome 02/05/2020   Body mass index 40.0-44.9, adult ([place]) 01/08/2020   Morbid obesity ([place]) 01/08/2020   Primary osteoarthritis of right knee 01/07/2020   TIA (transient ischemic attack) 05/07/2019   Vaginitis and vulvovaginitis 04/15/2016   Right knee pain 03/09/2016   GERD (gastroesophageal reflux disease) 03/09/2016   Influenza with respiratory manifestation 02/14/2016   H/O cold sores 06/21/2015   Diabetes mellitus type II, controlled ([place]) 06/13/2015   Hypokalemia 05/25/2015   Encounter to establish care 02/07/2015   Severe obesity (BMI >= 40) ([place]) 02/07/2015   Generalized anxiety disorder 02/07/2015   Past Medical History:  Diagnosis Date   Anemia    Anxiety    Cancer ([place])    Constipation    Depression    Diabetes mellitus without complication (HCC)    Edema, lower extremity    GERD (gastroesophageal reflux disease)    Hypertension    Joint pain    Lactose intolerance    Lupus ([place])    Osteoarthritis    Sleep apnea    Squamous cell carcinoma    Stroke [facility])    Stroke in Jan-2020 / TIA -April 2020    Family History  Problem Relation Age of Onset   Cancer Father  colon and prostate   Diabetes Father    Breast cancer Other    Depression Mother    Anxiety disorder Mother     Past Surgical History:  Procedure Laterality Date   ABDOMINAL HYSTERECTOMY     approx 5 years ago from 2017    CHOLECYSTECTOMY     WISDOM TOOTH EXTRACTION     Social History   Occupational History   Occupation: paralegal/nurse tech  Tobacco Use   Smoking status: Never   Smokeless tobacco: Never  Vaping Use   Vaping Use: Never used  Substance and Sexual Activity   Alcohol use: Yes    Alcohol/week: 0.0 standard drinks of alcohol   Drug use: No   Sexual activity: Not Currently    Partners: Male

## 2023-01-15 DIAGNOSIS — H0259 Other disorders affecting eyelid function: Secondary | ICD-10-CM | POA: Diagnosis not present

## 2023-01-15 DIAGNOSIS — H02831 Dermatochalasis of right upper eyelid: Secondary | ICD-10-CM | POA: Diagnosis not present

## 2023-01-15 DIAGNOSIS — H57813 Brow ptosis, bilateral: Secondary | ICD-10-CM | POA: Diagnosis not present

## 2023-01-15 DIAGNOSIS — H02834 Dermatochalasis of left upper eyelid: Secondary | ICD-10-CM | POA: Diagnosis not present

## 2023-01-22 DIAGNOSIS — Z85828 Personal history of other malignant neoplasm of skin: Secondary | ICD-10-CM | POA: Diagnosis not present

## 2023-01-22 DIAGNOSIS — L570 Actinic keratosis: Secondary | ICD-10-CM | POA: Diagnosis not present

## 2023-01-22 DIAGNOSIS — D2261 Melanocytic nevi of right upper limb, including shoulder: Secondary | ICD-10-CM | POA: Diagnosis not present

## 2023-01-22 DIAGNOSIS — X32XXXA Exposure to sunlight, initial encounter: Secondary | ICD-10-CM | POA: Diagnosis not present

## 2023-01-22 DIAGNOSIS — L298 Other pruritus: Secondary | ICD-10-CM | POA: Diagnosis not present

## 2023-01-22 DIAGNOSIS — D225 Melanocytic nevi of trunk: Secondary | ICD-10-CM | POA: Diagnosis not present

## 2023-01-22 DIAGNOSIS — D2262 Melanocytic nevi of left upper limb, including shoulder: Secondary | ICD-10-CM | POA: Diagnosis not present

## 2023-01-22 DIAGNOSIS — L820 Inflamed seborrheic keratosis: Secondary | ICD-10-CM | POA: Diagnosis not present

## 2023-01-22 DIAGNOSIS — L538 Other specified erythematous conditions: Secondary | ICD-10-CM | POA: Diagnosis not present

## 2023-02-01 ENCOUNTER — Telehealth: Payer: Self-pay

## 2023-02-01 NOTE — Telephone Encounter (Signed)
VOB submitted for Monovisc, left knee

## 2023-02-21 DIAGNOSIS — H02831 Dermatochalasis of right upper eyelid: Secondary | ICD-10-CM | POA: Diagnosis not present

## 2023-02-21 DIAGNOSIS — H02834 Dermatochalasis of left upper eyelid: Secondary | ICD-10-CM | POA: Diagnosis not present

## 2023-03-25 DIAGNOSIS — E1169 Type 2 diabetes mellitus with other specified complication: Secondary | ICD-10-CM | POA: Diagnosis not present

## 2023-03-25 DIAGNOSIS — E785 Hyperlipidemia, unspecified: Secondary | ICD-10-CM | POA: Diagnosis not present

## 2023-03-25 DIAGNOSIS — Z79899 Other long term (current) drug therapy: Secondary | ICD-10-CM | POA: Diagnosis not present

## 2023-03-25 DIAGNOSIS — E782 Mixed hyperlipidemia: Secondary | ICD-10-CM | POA: Diagnosis not present

## 2023-03-25 DIAGNOSIS — I10 Essential (primary) hypertension: Secondary | ICD-10-CM | POA: Diagnosis not present

## 2023-04-01 DIAGNOSIS — F411 Generalized anxiety disorder: Secondary | ICD-10-CM | POA: Diagnosis not present

## 2023-04-01 DIAGNOSIS — I10 Essential (primary) hypertension: Secondary | ICD-10-CM | POA: Diagnosis not present

## 2023-04-01 DIAGNOSIS — E782 Mixed hyperlipidemia: Secondary | ICD-10-CM | POA: Diagnosis not present

## 2023-04-01 DIAGNOSIS — E1169 Type 2 diabetes mellitus with other specified complication: Secondary | ICD-10-CM | POA: Diagnosis not present

## 2023-04-01 DIAGNOSIS — E039 Hypothyroidism, unspecified: Secondary | ICD-10-CM | POA: Diagnosis not present

## 2023-04-01 DIAGNOSIS — E785 Hyperlipidemia, unspecified: Secondary | ICD-10-CM | POA: Diagnosis not present

## 2023-04-01 DIAGNOSIS — Z1211 Encounter for screening for malignant neoplasm of colon: Secondary | ICD-10-CM | POA: Diagnosis not present

## 2023-04-01 DIAGNOSIS — E118 Type 2 diabetes mellitus with unspecified complications: Secondary | ICD-10-CM | POA: Diagnosis not present

## 2023-04-04 DIAGNOSIS — H524 Presbyopia: Secondary | ICD-10-CM | POA: Diagnosis not present

## 2023-04-04 DIAGNOSIS — H2513 Age-related nuclear cataract, bilateral: Secondary | ICD-10-CM | POA: Diagnosis not present

## 2023-04-04 DIAGNOSIS — H25043 Posterior subcapsular polar age-related cataract, bilateral: Secondary | ICD-10-CM | POA: Diagnosis not present

## 2023-04-04 DIAGNOSIS — H5213 Myopia, bilateral: Secondary | ICD-10-CM | POA: Diagnosis not present

## 2023-04-15 DIAGNOSIS — H01002 Unspecified blepharitis right lower eyelid: Secondary | ICD-10-CM | POA: Diagnosis not present

## 2023-04-15 DIAGNOSIS — H25813 Combined forms of age-related cataract, bilateral: Secondary | ICD-10-CM | POA: Diagnosis not present

## 2023-04-15 DIAGNOSIS — H01005 Unspecified blepharitis left lower eyelid: Secondary | ICD-10-CM | POA: Diagnosis not present

## 2023-05-29 DIAGNOSIS — H539 Unspecified visual disturbance: Secondary | ICD-10-CM | POA: Diagnosis not present

## 2023-05-29 DIAGNOSIS — G5603 Carpal tunnel syndrome, bilateral upper limbs: Secondary | ICD-10-CM | POA: Diagnosis not present

## 2023-05-29 DIAGNOSIS — R2689 Other abnormalities of gait and mobility: Secondary | ICD-10-CM | POA: Diagnosis not present

## 2023-05-29 DIAGNOSIS — G479 Sleep disorder, unspecified: Secondary | ICD-10-CM | POA: Diagnosis not present

## 2023-05-29 DIAGNOSIS — Z8659 Personal history of other mental and behavioral disorders: Secondary | ICD-10-CM | POA: Diagnosis not present

## 2023-05-29 DIAGNOSIS — R208 Other disturbances of skin sensation: Secondary | ICD-10-CM | POA: Diagnosis not present

## 2023-05-29 DIAGNOSIS — R200 Anesthesia of skin: Secondary | ICD-10-CM | POA: Diagnosis not present

## 2023-05-29 DIAGNOSIS — R519 Headache, unspecified: Secondary | ICD-10-CM | POA: Diagnosis not present

## 2023-05-29 DIAGNOSIS — Z8673 Personal history of transient ischemic attack (TIA), and cerebral infarction without residual deficits: Secondary | ICD-10-CM | POA: Diagnosis not present

## 2023-05-31 DIAGNOSIS — H25811 Combined forms of age-related cataract, right eye: Secondary | ICD-10-CM | POA: Diagnosis not present

## 2023-06-04 DIAGNOSIS — H25812 Combined forms of age-related cataract, left eye: Secondary | ICD-10-CM | POA: Diagnosis not present

## 2023-06-05 DIAGNOSIS — H25812 Combined forms of age-related cataract, left eye: Secondary | ICD-10-CM | POA: Diagnosis not present

## 2023-07-01 ENCOUNTER — Telehealth: Payer: Self-pay | Admitting: Orthopaedic Surgery

## 2023-07-01 NOTE — Telephone Encounter (Signed)
Pt would like Gel injection for right knee last injection was 01/01/2023

## 2023-07-03 NOTE — Telephone Encounter (Signed)
VOB submitted for Monovisc, right knee  

## 2023-07-04 ENCOUNTER — Telehealth: Payer: Self-pay | Admitting: Orthopaedic Surgery

## 2023-07-04 NOTE — Telephone Encounter (Signed)
Received call from patient needing to get a copy of all her medical records. She would like to be able to sign authorization and pick up records at the same time. Copy of records prepared,ready at front desk with auth attached for patient to sign. IC,lmvm advising ready to pickup.

## 2023-07-08 ENCOUNTER — Telehealth: Payer: Self-pay | Admitting: Orthopaedic Surgery

## 2023-07-08 NOTE — Telephone Encounter (Signed)
Patient called and like to know the status of the Gel Injection.[phone]

## 2023-07-08 NOTE — Telephone Encounter (Signed)
Faxed completed PA form to [facility] for Monovisc, right knee at [phone]. PA pending  Talked with patient concerning gel injection.  Patient voiced that she understands.

## 2023-07-09 DIAGNOSIS — E1169 Type 2 diabetes mellitus with other specified complication: Secondary | ICD-10-CM | POA: Diagnosis not present

## 2023-07-09 DIAGNOSIS — E785 Hyperlipidemia, unspecified: Secondary | ICD-10-CM | POA: Diagnosis not present

## 2023-07-16 DIAGNOSIS — E1169 Type 2 diabetes mellitus with other specified complication: Secondary | ICD-10-CM | POA: Diagnosis not present

## 2023-07-16 DIAGNOSIS — I10 Essential (primary) hypertension: Secondary | ICD-10-CM | POA: Diagnosis not present

## 2023-07-16 DIAGNOSIS — E118 Type 2 diabetes mellitus with unspecified complications: Secondary | ICD-10-CM | POA: Diagnosis not present

## 2023-07-16 DIAGNOSIS — E785 Hyperlipidemia, unspecified: Secondary | ICD-10-CM | POA: Diagnosis not present

## 2023-07-16 DIAGNOSIS — E782 Mixed hyperlipidemia: Secondary | ICD-10-CM | POA: Diagnosis not present

## 2023-07-16 DIAGNOSIS — Z0000 Encounter for general adult medical examination without abnormal findings: Secondary | ICD-10-CM | POA: Diagnosis not present

## 2023-07-16 DIAGNOSIS — E039 Hypothyroidism, unspecified: Secondary | ICD-10-CM | POA: Diagnosis not present

## 2023-07-16 DIAGNOSIS — Z6839 Body mass index (BMI) 39.0-39.9, adult: Secondary | ICD-10-CM | POA: Diagnosis not present

## 2023-07-16 DIAGNOSIS — Z79899 Other long term (current) drug therapy: Secondary | ICD-10-CM | POA: Diagnosis not present

## 2023-07-29 ENCOUNTER — Telehealth: Payer: Self-pay

## 2023-07-29 NOTE — Telephone Encounter (Signed)
Talked with [employer] concerning PA for Orthovisc, right knee and was advised that PA form had not been received although form had been faxed 2 previous times in July, 2024.  PA form has been refaxed to [facility] for the 3rd time.  PA still pending

## 2023-08-20 ENCOUNTER — Telehealth: Payer: Self-pay

## 2023-08-20 NOTE — Telephone Encounter (Signed)
PA is still pending for Monovisc, right knee.  Called [employer] to check status and had to refax PA form for the 5th time and to submit under patient's medical benefits.

## 2023-08-27 ENCOUNTER — Other Ambulatory Visit: Payer: Self-pay

## 2023-08-27 ENCOUNTER — Telehealth: Payer: Self-pay | Admitting: Orthopaedic Surgery

## 2023-08-27 DIAGNOSIS — M1711 Unilateral primary osteoarthritis, right knee: Secondary | ICD-10-CM

## 2023-08-27 NOTE — Telephone Encounter (Signed)
Patient called and wanted to know if she was approved for the Gel injection and if  so would like to schedule. [id number]#[phone]

## 2023-08-28 NOTE — Telephone Encounter (Signed)
Patient has been scheduled for gel injection.  

## 2023-09-03 ENCOUNTER — Ambulatory Visit: Payer: 59 | Admitting: Physician Assistant

## 2023-09-10 ENCOUNTER — Ambulatory Visit: Payer: 59 | Admitting: Physician Assistant

## 2023-09-12 ENCOUNTER — Ambulatory Visit: Payer: 59 | Admitting: Orthopaedic Surgery

## 2023-09-12 DIAGNOSIS — M1711 Unilateral primary osteoarthritis, right knee: Secondary | ICD-10-CM | POA: Diagnosis not present

## 2023-09-12 MED ORDER — HYALURONAN 88 MG/4ML IX SOSY
88.0000 mg | PREFILLED_SYRINGE | INTRA_ARTICULAR | Status: AC | PRN
  Administered 2023-09-12: 88 mg via INTRA_ARTICULAR

## 2023-09-12 NOTE — Progress Notes (Signed)
Procedure Note  Patient: Natalie Greene             Date of Birth: [birth year]           MRN: [id number]             Visit Date: 09/12/2023  Procedures: Visit Diagnoses:  1. Primary osteoarthritis of right knee    Laporshia is here for right knee Monovisc injection.  Large Joint Inj: R knee on 09/12/2023 11:01 AM Indications: pain Details: 22 G needle  Arthrogram: No  Medications: 88 mg Hyaluronan 88 MG/4ML Outcome: tolerated well, no immediate complications Patient was prepped and draped in the usual sterile fashion.

## 2023-10-14 DIAGNOSIS — Z79899 Other long term (current) drug therapy: Secondary | ICD-10-CM | POA: Diagnosis not present

## 2023-10-14 DIAGNOSIS — I10 Essential (primary) hypertension: Secondary | ICD-10-CM | POA: Diagnosis not present

## 2023-10-14 DIAGNOSIS — E782 Mixed hyperlipidemia: Secondary | ICD-10-CM | POA: Diagnosis not present

## 2023-10-14 DIAGNOSIS — E118 Type 2 diabetes mellitus with unspecified complications: Secondary | ICD-10-CM | POA: Diagnosis not present

## 2023-10-21 DIAGNOSIS — Z1231 Encounter for screening mammogram for malignant neoplasm of breast: Secondary | ICD-10-CM | POA: Diagnosis not present

## 2023-10-21 DIAGNOSIS — E785 Hyperlipidemia, unspecified: Secondary | ICD-10-CM | POA: Diagnosis not present

## 2023-10-21 DIAGNOSIS — I10 Essential (primary) hypertension: Secondary | ICD-10-CM | POA: Diagnosis not present

## 2023-10-21 DIAGNOSIS — E119 Type 2 diabetes mellitus without complications: Secondary | ICD-10-CM | POA: Diagnosis not present

## 2023-10-21 DIAGNOSIS — Z85828 Personal history of other malignant neoplasm of skin: Secondary | ICD-10-CM | POA: Diagnosis not present

## 2023-10-21 DIAGNOSIS — K5900 Constipation, unspecified: Secondary | ICD-10-CM | POA: Diagnosis not present

## 2023-10-21 DIAGNOSIS — M1990 Unspecified osteoarthritis, unspecified site: Secondary | ICD-10-CM | POA: Diagnosis not present

## 2023-10-21 DIAGNOSIS — E118 Type 2 diabetes mellitus with unspecified complications: Secondary | ICD-10-CM | POA: Diagnosis not present

## 2023-10-21 DIAGNOSIS — Z1211 Encounter for screening for malignant neoplasm of colon: Secondary | ICD-10-CM | POA: Diagnosis not present

## 2023-10-21 DIAGNOSIS — Z79899 Other long term (current) drug therapy: Secondary | ICD-10-CM | POA: Diagnosis not present

## 2023-10-21 DIAGNOSIS — I69354 Hemiplegia and hemiparesis following cerebral infarction affecting left non-dominant side: Secondary | ICD-10-CM | POA: Diagnosis not present

## 2023-10-21 DIAGNOSIS — K219 Gastro-esophageal reflux disease without esophagitis: Secondary | ICD-10-CM | POA: Diagnosis not present

## 2023-10-21 DIAGNOSIS — E669 Obesity, unspecified: Secondary | ICD-10-CM | POA: Diagnosis not present

## 2023-10-21 DIAGNOSIS — E039 Hypothyroidism, unspecified: Secondary | ICD-10-CM | POA: Diagnosis not present

## 2023-10-21 DIAGNOSIS — E782 Mixed hyperlipidemia: Secondary | ICD-10-CM | POA: Diagnosis not present

## 2023-10-21 DIAGNOSIS — Z6839 Body mass index (BMI) 39.0-39.9, adult: Secondary | ICD-10-CM | POA: Diagnosis not present

## 2023-10-21 DIAGNOSIS — E66812 Obesity, class 2: Secondary | ICD-10-CM | POA: Diagnosis not present

## 2023-10-21 DIAGNOSIS — Z7982 Long term (current) use of aspirin: Secondary | ICD-10-CM | POA: Diagnosis not present

## 2023-10-21 DIAGNOSIS — Z809 Family history of malignant neoplasm, unspecified: Secondary | ICD-10-CM | POA: Diagnosis not present

## 2023-10-22 ENCOUNTER — Other Ambulatory Visit: Payer: Self-pay | Admitting: Internal Medicine

## 2023-10-22 DIAGNOSIS — Z1231 Encounter for screening mammogram for malignant neoplasm of breast: Secondary | ICD-10-CM

## 2024-01-07 ENCOUNTER — Ambulatory Visit
Admission: RE | Admit: 2024-01-07 | Discharge: 2024-01-07 | Disposition: A | Discharge status: Home / Self Care | Payer: Medicare Other | Source: Ambulatory Visit | Attending: Internal Medicine | Admitting: Internal Medicine

## 2024-01-07 DIAGNOSIS — Z1231 Encounter for screening mammogram for malignant neoplasm of breast: Secondary | ICD-10-CM | POA: Diagnosis present

## 2024-02-10 ENCOUNTER — Other Ambulatory Visit: Payer: Self-pay

## 2024-02-10 ENCOUNTER — Emergency Department: Payer: Medicare Other

## 2024-02-10 ENCOUNTER — Emergency Department
Admission: EM | Admit: 2024-02-10 | Discharge: 2024-02-10 | Disposition: A | Discharge status: Home / Self Care | Payer: Medicare Other | Attending: Student in an Organized Health Care Education/Training Program | Admitting: Student in an Organized Health Care Education/Training Program

## 2024-02-10 DIAGNOSIS — R109 Unspecified abdominal pain: Secondary | ICD-10-CM | POA: Diagnosis present

## 2024-02-10 DIAGNOSIS — N3000 Acute cystitis without hematuria: Secondary | ICD-10-CM | POA: Insufficient documentation

## 2024-02-10 LAB — URINALYSIS, ROUTINE W REFLEX MICROSCOPIC
Bacteria, UA: NONE SEEN
Bilirubin Urine: NEGATIVE
Glucose, UA: NEGATIVE mg/dL
Ketones, ur: NEGATIVE mg/dL
Nitrite: NEGATIVE
Protein, ur: >=300 mg/dL — AB
Specific Gravity, Urine: 1.016 (ref 1.005–1.030)
WBC, UA: >50 WBC/hpf (ref 0–5)
pH: 5 (ref 5.0–8.0)

## 2024-02-10 LAB — CBC
HCT: 37.3 % (ref 36.0–46.0)
Hemoglobin: 12.7 g/dL (ref 12.0–15.0)
MCH: 28.9 pg (ref 26.0–34.0)
MCHC: 34 g/dL (ref 30.0–36.0)
MCV: 84.8 fL (ref 80.0–100.0)
Platelets: 249 10*3/uL (ref 150–400)
RBC: 4.4 MIL/uL (ref 3.87–5.11)
RDW: 13.4 % (ref 11.5–15.5)
WBC: 10.6 10*3/uL — ABNORMAL HIGH (ref 4.0–10.5)
nRBC: 0 % (ref 0.0–0.2)

## 2024-02-10 LAB — COMPREHENSIVE METABOLIC PANEL
ALT: 16 U/L (ref 0–44)
AST: 16 U/L (ref 15–41)
Albumin: 3.7 g/dL (ref 3.5–5.0)
Alkaline Phosphatase: 44 U/L (ref 38–126)
Anion gap: 9 (ref 5–15)
BUN: 22 mg/dL — ABNORMAL HIGH (ref 6–20)
CO2: 25 mmol/L (ref 22–32)
Calcium: 9.1 mg/dL (ref 8.9–10.3)
Chloride: 102 mmol/L (ref 98–111)
Creatinine, Ser: 0.7 mg/dL (ref 0.44–1.00)
GFR, Estimated: >60 mL/min (ref >60)
Glucose, Bld: 129 mg/dL — ABNORMAL HIGH (ref 70–99)
Potassium: 4.2 mmol/L (ref 3.5–5.1)
Sodium: 136 mmol/L (ref 135–145)
Total Bilirubin: 0.8 mg/dL (ref 0.0–1.2)
Total Protein: 7.4 g/dL (ref 6.5–8.1)

## 2024-02-10 LAB — LIPASE, BLOOD: Lipase: 35 U/L (ref 11–51)

## 2024-02-10 MED ORDER — SODIUM CHLORIDE 0.9 % IV SOLN
1.0000 g | Freq: Once | INTRAVENOUS | Status: AC
  Administered 2024-02-10: 1 g via INTRAVENOUS
  Filled 2024-02-10: qty 10

## 2024-02-10 MED ORDER — IOHEXOL 300 MG/ML  SOLN
100.0000 mL | Freq: Once | INTRAMUSCULAR | Status: AC | PRN
  Administered 2024-02-10: 100 mL via INTRAVENOUS

## 2024-02-10 MED ORDER — CEPHALEXIN 500 MG PO CAPS: 500.0000 mg | ORAL_CAPSULE | Freq: Two times a day (BID) | ORAL | 0 refills | Status: AC

## 2024-02-10 MED ORDER — SODIUM CHLORIDE 0.9 % IV BOLUS
500.0000 mL | Freq: Once | INTRAVENOUS | Status: AC
  Administered 2024-02-10: 500 mL via INTRAVENOUS

## 2024-02-10 NOTE — ED Provider Notes (Signed)
 [facility] Provider Note    Event Date/Time   First MD Initiated Contact with Patient 02/10/24 0932     (approximate)   History   Abdominal Pain   HPI  Natalie Greene is a 53 y.o. female who presents to the ER for evaluation of left lower quadrant abdominal pain and periumbilical pain that started about 3 days ago.  She is on Mounjaro.  States that her bowel movements have been regular.  Denies any fevers but having significant discomfort is worsening so she came to the ER as her PCP was unable to work her in.  She denies any chest pain or shortness of breath.  No dysuria.     Physical Exam   Triage Vital Signs: ED Triage Vitals  Encounter Vitals Group     BP 02/10/24 0922 115/82     Systolic BP Percentile --      Diastolic BP Percentile --      Pulse Rate 02/10/24 0922 (!) 50     Resp 02/10/24 0922 18     Temp 02/10/24 0922 98.3 F (36.8 C)     Temp Source 02/10/24 0922 Oral     SpO2 02/10/24 0922 95 %     Weight 02/10/24 0923 216 lb 0.8 oz (98 kg)     Height 02/10/24 0923 5' (1.524 m)     Head Circumference --      Peak Flow --      Pain Score 02/10/24 0922 8     Pain Loc --      Pain Education --      Exclude from Growth Chart --     Most recent vital signs: Vitals:   02/10/24 0922  BP: 115/82  Pulse: (!) 50  Resp: 18  Temp: 98.3 F (36.8 C)  SpO2: 95%     Constitutional: Alert  Eyes: Conjunctivae are normal.  Head: Atraumatic. Nose: No congestion/rhinnorhea. Mouth/Throat: Mucous membranes are moist.   Neck: Painless ROM.  Cardiovascular:   Good peripheral circulation. Respiratory: Normal respiratory effort.  No retractions.  Gastrointestinal: Soft with mild tenderness to palpation Musculoskeletal:  no deformity Neurologic:  MAE spontaneously. No gross focal neurologic deficits are appreciated.  Skin:  Skin is warm, dry and intact. No rash noted. Psychiatric: Mood and affect are normal. Speech and behavior are  normal.    ED Results / Procedures / Treatments   Labs (all labs ordered are listed, but only abnormal results are displayed) Labs Reviewed  COMPREHENSIVE METABOLIC PANEL - Abnormal; Notable for the following components:      Result Value   Glucose, Bld 129 (*)    BUN 22 (*)    All other components within normal limits  CBC - Abnormal; Notable for the following components:   WBC 10.6 (*)    All other components within normal limits  URINALYSIS, ROUTINE W REFLEX MICROSCOPIC - Abnormal; Notable for the following components:   Color, Urine YELLOW (*)    APPearance CLOUDY (*)    Hgb urine dipstick MODERATE (*)    Protein, ur >=300 (*)    Leukocytes,Ua LARGE (*)    All other components within normal limits  LIPASE, BLOOD        RADIOLOGY  Please see ED Course for my review and interpretation.  I personally reviewed all radiographic images ordered to evaluate for the above acute complaints and reviewed radiology reports and findings.  These findings were personally discussed with the patient.  Please see  medical record for radiology report.    PROCEDURES:  Critical Care performed: No  Procedures   MEDICATIONS ORDERED IN ED: Medications  cefTRIAXone (ROCEPHIN) 1 g in sodium chloride 0.9 % 100 mL IVPB (1 g Intravenous New Bag/Given 02/10/24 1139)  iohexol (OMNIPAQUE) 300 MG/ML solution 100 mL (100 mLs Intravenous Contrast Given 02/10/24 1030)  sodium chloride 0.9 % bolus 500 mL (500 mLs Intravenous New Bag/Given 02/10/24 1142)     IMPRESSION / MDM / ASSESSMENT AND PLAN / ED COURSE  I reviewed the triage vital signs and the nursing notes.                              Differential diagnosis includes, but is not limited to, diverticulitis, colitis, stone, uti, mass, sbo, appendicitis  Patient presenting to the ER for evaluation of symptoms as described above.  Based on symptoms, risk factors and considered above differential, this presenting complaint could reflect a  potentially life-threatening illness therefore the patient will be placed on continuous pulse oximetry and telemetry for monitoring.  Laboratory evaluation will be sent to evaluate for the above complaints.      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  [PR]  1135 CT imaging without evidence of SBO diverticulitis or stone.  Exam findings and imaging consistent with UTI.  Will order dose of antibiotics.  Do anticipate patient will be appropriate for outpatient follow-up. [PR]    Clinical Course User Index [PR] Willy Eddy, MD     FINAL CLINICAL IMPRESSION(S) / ED DIAGNOSES   Final diagnoses:  Acute cystitis without hematuria     Rx / DC Orders   ED Discharge Orders          Ordered    cephALEXin (KEFLEX) 500 MG capsule  2 times daily        02/10/24 1155             Note:  This document was prepared using Dragon voice recognition software and may include unintentional dictation errors.    Willy Eddy, MD 02/10/24 [phone]

## 2024-02-10 NOTE — ED Triage Notes (Signed)
 Pt states pain in LLQ that started 3 days ago, pt denies any fevers or chills. Pt denies dysuria. Pt states Menjaro for the past year. Pt states PCP sent here for [place]. NAD noted.

## 2024-02-10 NOTE — ED Notes (Signed)
 See triage note  Presents with abd pain  States pain is lower mid abd and moves to the left  Started 3 days ago  Denies any fever ,urinary sxs'   Has nausea only no vomiting or diarrhea

## 2024-05-26 ENCOUNTER — Ambulatory Visit (INDEPENDENT_AMBULATORY_CARE_PROVIDER_SITE_OTHER): Admitting: Dermatology

## 2024-05-26 ENCOUNTER — Encounter: Payer: Self-pay | Admitting: Dermatology

## 2024-05-26 DIAGNOSIS — Z85828 Personal history of other malignant neoplasm of skin: Secondary | ICD-10-CM

## 2024-05-26 DIAGNOSIS — D492 Neoplasm of unspecified behavior of bone, soft tissue, and skin: Secondary | ICD-10-CM | POA: Diagnosis not present

## 2024-05-26 DIAGNOSIS — L859 Epidermal thickening, unspecified: Secondary | ICD-10-CM | POA: Diagnosis not present

## 2024-05-26 DIAGNOSIS — D485 Neoplasm of uncertain behavior of skin: Secondary | ICD-10-CM

## 2024-05-26 NOTE — Progress Notes (Signed)
   New Patient Visit   Subjective  Natalie Greene is a 53 y.o. female who presents for the following: spot at left breast that itches badly, sometimes hurts, present for months but has gotten larger. 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   The patient has spots, moles and lesions to be evaluated, some may be new or changing and the patient may have concern these could be cancer.   The following portions of the chart were reviewed this encounter and updated as appropriate: medications, allergies, medical history  Review of Systems:  No other skin or systemic complaints except as noted in HPI or Assessment and Plan.  Objective  Well appearing patient in no apparent distress; mood and affect are within normal limits.   A focused examination was performed of the following areas: trunk  Relevant exam findings are noted in the Assessment and Plan.  left medial inframammary 9 mm eroded pink papule   Assessment & Plan     NEOPLASM OF UNCERTAIN BEHAVIOR OF SKIN left medial inframammary Skin / nail biopsy Type of biopsy: tangential   Informed consent: discussed and consent obtained   Timeout: patient name, date of birth, surgical site, and procedure verified   Procedure prep:  Patient was prepped and draped in usual sterile fashion Prep type:  Isopropyl alcohol Anesthesia: the lesion was anesthetized in a standard fashion   Anesthetic:  1% lidocaine  w/ epinephrine 1-100,000 buffered w/ 8.4% NaHCO3 Instrument used: DermaBlade   Hemostasis achieved with: pressure and aluminum chloride   Outcome: patient tolerated procedure well   Post-procedure details: sterile dressing applied and wound care instructions given   Dressing type: bandage and petrolatum   Specimen 1 - Surgical pathology Differential Diagnosis: SK vs SCC  Check Margins: No  Return if symptoms worsen or fail to improve.  Kerstin Peeling, RMA, am acting as scribe for Harris Liming, MD  .   Documentation: I have reviewed the above documentation for accuracy and completeness, and I agree with the above.  Harris Liming, MD

## 2024-05-26 NOTE — Patient Instructions (Signed)

## 2024-06-01 ENCOUNTER — Ambulatory Visit: Payer: Self-pay | Admitting: Dermatology

## 2024-06-18 ENCOUNTER — Encounter: Payer: Self-pay | Admitting: *Deleted

## 2024-07-28 ENCOUNTER — Encounter: Payer: Self-pay | Admitting: Dermatology

## 2024-07-28 ENCOUNTER — Ambulatory Visit: Admitting: Dermatology

## 2024-07-28 DIAGNOSIS — L253 Unspecified contact dermatitis due to other chemical products: Secondary | ICD-10-CM

## 2024-07-28 DIAGNOSIS — L650 Telogen effluvium: Secondary | ICD-10-CM

## 2024-07-28 DIAGNOSIS — L245 Irritant contact dermatitis due to other chemical products: Secondary | ICD-10-CM

## 2024-07-28 MED ORDER — MINOXIDIL 2.5 MG PO TABS: 1.2500 mg | ORAL_TABLET | Freq: Every day | ORAL | 5 refills | Status: DC

## 2024-07-28 NOTE — Progress Notes (Signed)
   Follow-Up Visit   Subjective  Natalie Greene is a 53 y.o. female who presents for the following: hair loss. Patient advises hair loss has been going on for about 1 year after starting Mounjaro for weight loss. She has lost 85-90 pounds. She has used Botana oil, Nutrafol, MaryRuth Hair Growth Max supplement with Biotin, Nioxin hair products.   Patient has started having hot flashes this year.   The following portions of the chart were reviewed this encounter and updated as appropriate: medications, allergies, medical history  Review of Systems:  No other skin or systemic complaints except as noted in HPI or Assessment and Plan.  Objective  Well appearing patient in no apparent distress; mood and affect are within normal limits.   A focused examination was performed of the following areas: scalp  Relevant exam findings are noted in the Assessment and Plan.    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 It does not progress to baldness, and the hair eventually grows back on its own. It can be triggered by recent illness, recent surgery, thyroid  disease, low iron stores, vitamin D deficiency, fad diets or rapid weight loss, hormonal changes such as pregnancy or birth control pills, and some medication. Usually the hair loss starts 2-3 months after the illness or health change. Rarely, it can continue for longer than a year. Treatments options may include oral or topical Minoxidil   Treatment Plan: Start minoxidil  1.25 mg every day.  Decrease use of topicals due to inflammation and irritation at scalp  Patient is having blood work done 08/21/24 and will have PCP  add Ferritin. Have PCP check labs for onset of menopause.  Doses of oral minoxidil  for hair loss are considered 'low dose'. This is because the doses used for hair loss are much lower than the doses which are used for conditions such as high blood pressure (hypertension). The doses used for hypertension are 10-40mg  per day.  Side effects are uncommon at the low doses (up to 2.5 mg/day) used to treat hair loss. Potential side effects, more commonly seen at higher doses, include: Increase in hair growth (hypertrichosis) elsewhere on face and body Temporary hair shedding upon starting medication which may last up to 4 weeks Ankle swelling, fluid retention, rapid weight gain more than 5 pounds Low blood pressure and feeling lightheaded or dizzy when standing up quickly Fast or irregular heartbeat Headaches  Patient advised it can take at least 6 months to see improvement.         TELOGEN EFFLUVIUM   Related Medications minoxidil  (LONITEN ) 2.5 MG tablet Take 0.5 tablets (1.25 mg total) by mouth daily. IRRITANT CONTACT DERMATITIS DUE TO OTHER CHEMICAL PRODUCTS    Return in about 6 months (around 01/28/2025) for Alopecia, with Dr. Claudene.  LILLETTE Lonell Drones, RMA, am acting as scribe for Boneta Claudene, MD .   Documentation: I have reviewed the above documentation for accuracy and completeness, and I agree with the above.  Boneta Claudene, MD

## 2024-07-28 NOTE — Patient Instructions (Addendum)
 Start minoxidil  1.25 mg (half of a tablet) daily.   Recommend minoxidil  5% (Rogaine  for men) solution or foam to be applied to the scalp and left in. This should ideally be used twice daily for best results but it helps with hair regrowth when used at least three times per week. Rogaine  initially can cause increased hair shedding for the first few weeks but this will stop with continued use. In studies, people who used minoxidil  (Rogaine ) for at least 6 months had thicker hair than people who did not. Minoxidil  topical (Rogaine ) only works as long as it continues to be used. If if it is no longer used then the hair it has been helping to regrow can fall out. Minoxidil  topical (Rogaine ) can cause increased facial hair growth.  Doses of oral minoxidil  for hair loss are considered 'low dose'. This is because the doses used for hair loss are much lower than the doses which are used for conditions such as high blood pressure (hypertension). The doses used for hypertension are 10-40mg  per day.  Side effects are uncommon at the low doses (up to 2.5 mg/day) used to treat hair loss. Potential side effects, more commonly seen at higher doses, include: Increase in hair growth (hypertrichosis) elsewhere on face and body Temporary hair shedding upon starting medication which may last up to 4 weeks Ankle swelling, fluid retention, rapid weight gain more than 5 pounds Low blood pressure and feeling lightheaded or dizzy when standing up quickly Fast or irregular heartbeat Headaches

## 2024-07-31 ENCOUNTER — Ambulatory Visit: Admitting: Anesthesiology

## 2024-07-31 ENCOUNTER — Ambulatory Visit
Admission: RE | Admit: 2024-07-31 | Discharge: 2024-07-31 | Disposition: A | Discharge status: Home / Self Care | Attending: Gastroenterology | Admitting: Gastroenterology

## 2024-07-31 ENCOUNTER — Encounter
Admission: RE | Disposition: A | Discharge status: Home / Self Care | Payer: Self-pay | Source: Home / Self Care | Attending: Gastroenterology

## 2024-07-31 ENCOUNTER — Other Ambulatory Visit: Payer: Self-pay

## 2024-07-31 DIAGNOSIS — Z7985 Long-term (current) use of injectable non-insulin antidiabetic drugs: Secondary | ICD-10-CM | POA: Diagnosis not present

## 2024-07-31 DIAGNOSIS — E119 Type 2 diabetes mellitus without complications: Secondary | ICD-10-CM | POA: Insufficient documentation

## 2024-07-31 DIAGNOSIS — F419 Anxiety disorder, unspecified: Secondary | ICD-10-CM | POA: Insufficient documentation

## 2024-07-31 DIAGNOSIS — Z8673 Personal history of transient ischemic attack (TIA), and cerebral infarction without residual deficits: Secondary | ICD-10-CM | POA: Diagnosis not present

## 2024-07-31 DIAGNOSIS — K640 First degree hemorrhoids: Secondary | ICD-10-CM | POA: Insufficient documentation

## 2024-07-31 DIAGNOSIS — Z1211 Encounter for screening for malignant neoplasm of colon: Secondary | ICD-10-CM | POA: Insufficient documentation

## 2024-07-31 DIAGNOSIS — K5730 Diverticulosis of large intestine without perforation or abscess without bleeding: Secondary | ICD-10-CM | POA: Diagnosis not present

## 2024-07-31 DIAGNOSIS — G4733 Obstructive sleep apnea (adult) (pediatric): Secondary | ICD-10-CM | POA: Diagnosis not present

## 2024-07-31 DIAGNOSIS — I10 Essential (primary) hypertension: Secondary | ICD-10-CM | POA: Diagnosis not present

## 2024-07-31 DIAGNOSIS — Z800 Family history of malignant neoplasm of digestive organs: Secondary | ICD-10-CM | POA: Diagnosis not present

## 2024-07-31 HISTORY — PX: COLONOSCOPY: SHX5424

## 2024-07-31 LAB — GLUCOSE, CAPILLARY: Glucose-Capillary: 67 mg/dL — ABNORMAL LOW (ref 70–99)

## 2024-07-31 SURGERY — COLONOSCOPY
Anesthesia: General

## 2024-07-31 MED ORDER — LIDOCAINE HCL (CARDIAC) PF 100 MG/5ML IV SOSY
PREFILLED_SYRINGE | INTRAVENOUS | Status: DC | PRN
  Administered 2024-07-31: 60 mg via INTRAVENOUS

## 2024-07-31 MED ORDER — DEXMEDETOMIDINE HCL IN NACL 80 MCG/20ML IV SOLN
INTRAVENOUS | Status: DC | PRN
  Administered 2024-07-31: 12 ug via INTRAVENOUS
  Administered 2024-07-31: 8 ug via INTRAVENOUS

## 2024-07-31 MED ORDER — PROPOFOL 500 MG/50ML IV EMUL
INTRAVENOUS | Status: DC | PRN
  Administered 2024-07-31: 75 ug/kg/min via INTRAVENOUS

## 2024-07-31 MED ORDER — PROPOFOL 10 MG/ML IV BOLUS
INTRAVENOUS | Status: DC | PRN
  Administered 2024-07-31: 30 mg via INTRAVENOUS
  Administered 2024-07-31: 50 mg via INTRAVENOUS

## 2024-07-31 MED ORDER — SODIUM CHLORIDE 0.9 % IV SOLN: INTRAVENOUS | Status: DC

## 2024-07-31 MED ORDER — PROPOFOL 1000 MG/100ML IV EMUL
INTRAVENOUS | Status: AC
  Filled 2024-07-31: qty 100

## 2024-07-31 NOTE — Anesthesia Postprocedure Evaluation (Signed)
 Anesthesia Post Note  Patient: Natalie Greene  Procedure(s) Performed: COLONOSCOPY  Patient location during evaluation: PACU Anesthesia Type: General Level of consciousness: awake Pain management: satisfactory to patient Vital Signs Assessment: post-procedure vital signs reviewed and stable Respiratory status: spontaneous breathing Cardiovascular status: blood pressure returned to baseline Anesthetic complications: no   No notable events documented.   Last Vitals:  Vitals:   07/31/24 1302 07/31/24 1426  BP: 121/78 103/64  Pulse: 62 69  Resp: 18 18  Temp: (!) 36.1 C (!) 35.9 C  SpO2: 100% 100%    Last Pain:  Vitals:   07/31/24 1426  TempSrc: Temporal  PainSc: 0-No pain                 VAN STAVEREN,Leonie Amacher

## 2024-07-31 NOTE — Interval H&P Note (Signed)
 History and Physical Interval Note:  07/31/2024 2:01 PM  Natalie Greene  has presented today for surgery, with the diagnosis of family history of colon cancer.  The various methods of treatment have been discussed with the patient and family. After consideration of risks, benefits and other options for treatment, the patient has consented to  Procedure(s): COLONOSCOPY (N/A) as a surgical intervention.  The patient's history has been reviewed, patient examined, no change in status, stable for surgery.  I have reviewed the patient's chart and labs.  Questions were answered to the patient's satisfaction.     Ole ONEIDA Schick  Ok to proceed with colonoscopy

## 2024-07-31 NOTE — OR Nursing (Signed)
 Noted  FSBS 67, DM on Monjourn (last dose 07/12/24), on no insulins; made Dr Fleeta Blush aware; pt A/O and w/o S/S of hypoglycemia

## 2024-07-31 NOTE — Transfer of Care (Signed)
 Immediate Anesthesia Transfer of Care Note  Patient: Natalie Greene  Procedure(s) Performed: COLONOSCOPY  Patient Location: PACU  Anesthesia Type:General  Level of Consciousness: sedated  Airway & Oxygen Therapy: Patient Spontanous Breathing  Post-op Assessment: Report given to RN and Post -op Vital signs reviewed and stable  Post vital signs: Reviewed and stable  Last Vitals:  Vitals Value Taken Time  BP 103/64 07/31/24 14:27  Temp 35.9 C 07/31/24 14:26  Pulse 67 07/31/24 14:28  Resp 19 07/31/24 14:28  SpO2 100 % 07/31/24 14:28  Vitals shown include unfiled device data.  Last Pain:  Vitals:   07/31/24 1426  TempSrc: Temporal  PainSc: 0-No pain         Complications: No notable events documented.

## 2024-07-31 NOTE — H&P (Signed)
 Outpatient short stay form Pre-procedure 07/31/2024  Ole ONEIDA Schick, MD  Primary Physician: Auston Reyes BIRCH, MD  Reason for visit:  Screening  History of present illness:    53 y/o lady with history of hypertension, DM II, and OSA here for index screening colonoscopy. No blood thinners. Father had colon cancer in his 78's. History of hysterectomy and tubal ligation.    Current Facility-Administered Medications:    0.9 %  sodium chloride  infusion, , Intravenous, Continuous, Edna Rede, Ole ONEIDA, MD   0.9 %  sodium chloride  infusion, , Intravenous, Continuous, Novalie Leamy, Ole ONEIDA, MD, Last Rate: 40 mL/hr at 07/31/24 1354, Continued from Pre-op at 07/31/24 1354  Medications Prior to Admission  Medication Sig Dispense Refill Last Dose/Taking   Ascorbic Acid  (VITAMIN C  PO) Take 1 tablet by mouth daily.   07/30/2024   escitalopram  (LEXAPRO ) 10 MG tablet Take 10 mg by mouth daily.   07/30/2024   esomeprazole  (NEXIUM ) 40 MG capsule Take 1 capsule (40 mg total) by mouth daily at 12 noon. 90 capsule 1 07/30/2024   minoxidil  (LONITEN ) 2.5 MG tablet Take 0.5 tablets (1.25 mg total) by mouth daily. 15 tablet 5 07/30/2024   Tirzepatide (MOUNJARO [place]) Inject into the skin.   07/12/2024   VITAMIN E PO Take 1 capsule by mouth daily.   07/30/2024   ALPRAZolam  (XANAX ) 1 MG tablet TAKE ONE TABLET BY MOUTH AT BEDTIME AS NEEDED FOR ANXIETY (Patient taking differently: Take 1 mg by mouth at bedtime as needed for sleep.) 30 tablet 2    aspirin  81 MG chewable tablet Chew 81 mg by mouth daily. (Patient not taking: Reported on 07/31/2024)   Not Taking   atorvastatin  (LIPITOR) 20 MG tablet Take 1 tablet (20 mg total) by mouth daily. 30 tablet 2    celecoxib (CELEBREX) 200 MG capsule Take 200 mg by mouth daily.      cyclobenzaprine  (FLEXERIL ) 10 MG tablet Take 1 tablet (10 mg total) by mouth 3 (three) times daily as needed for muscle spasms. 15 tablet 0    Diclofenac Sodium  (PENNSAID ) 2 % SOLN Apply 2 g topically 2  (two) times daily as needed (to affected area). 112 g 3    furosemide  (LASIX ) 20 MG tablet Take 20 mg by mouth daily.      ibuprofen  (ADVIL ) 600 MG tablet Take 1 tablet (600 mg total) by mouth every 6 (six) hours as needed. 30 tablet 0    liraglutide (VICTOZA) 18 MG/3ML SOPN Inject 1.2 mg into the skin daily.      lisinopril  (PRINIVIL ,ZESTRIL ) 5 MG tablet Take 1 tablet (5 mg total) by mouth daily. 30 tablet 2    meloxicam  (MOBIC ) 15 MG tablet Take 1 tablet (15 mg total) by mouth daily. 30 tablet 0    oxyCODONE -acetaminophen  (PERCOCET) 10-325 MG tablet Take 1 tablet by mouth every 4 (four) hours as needed for pain.      valACYclovir  (VALTREX ) 500 MG tablet TAKE 1 TABLET (500 MG TOTAL) BY MOUTH 2 (TWO) TIMES DAILY. (Patient taking differently: Take 500 mg by mouth 2 (two) times daily as needed (for fever blisters).) 30 tablet 2      Allergies  Allergen Reactions   Wellbutrin  [Bupropion ] Other (See Comments)    Suicidal Ideation     Past Medical History:  Diagnosis Date   Anemia    Anxiety    Cancer (HCC)    Constipation    Depression    Diabetes mellitus without complication (HCC)    Edema, lower  extremity    GERD (gastroesophageal reflux disease)    Hypertension    Joint pain    Lactose intolerance    Lupus    Osteoarthritis    Sleep apnea    Squamous cell carcinoma    Stroke [facility])    Stroke in Jan-2020 / TIA -April 2020    Review of systems:  Otherwise negative.    Physical Exam  Gen: Alert, oriented. Appears stated age.  HEENT: PERRLA. Lungs: No respiratory distress CV: RRR Abd: soft, benign, no masses Ext: No edema    Planned procedures: Proceed with colonoscopy. The patient understands the nature of the planned procedure, indications, risks, alternatives and potential complications including but not limited to bleeding, infection, perforation, damage to internal organs and possible oversedation/side effects from anesthesia. The patient agrees and gives consent  to proceed.  Please refer to procedure notes for findings, recommendations and patient disposition/instructions.     Ole ONEIDA Schick, MD [facility]

## 2024-07-31 NOTE — Anesthesia Preprocedure Evaluation (Signed)
 Anesthesia Evaluation  Patient identified by MRN, date of birth, ID band Patient awake    Reviewed: Allergy & Precautions, NPO status , Patient's Chart, lab work & pertinent test results  Airway Mallampati: II  TM Distance: >3 FB Neck ROM: full    Dental  (+) Teeth Intact   Pulmonary neg pulmonary ROS, sleep apnea    Pulmonary exam normal        Cardiovascular hypertension, Pt. on medications negative cardio ROS Normal cardiovascular exam Rhythm:Regular Rate:Normal     Neuro/Psych   Anxiety     CVA, No Residual Symptoms negative neurological ROS  negative psych ROS   GI/Hepatic negative GI ROS, Neg liver ROS,GERD  Medicated,,  Endo/Other  negative endocrine ROSdiabetes, Type 2, Oral Hypoglycemic Agents    Renal/GU negative Renal ROS  negative genitourinary   Musculoskeletal  (+) Arthritis ,    Abdominal Normal abdominal exam  (+)   Peds negative pediatric ROS (+)  Hematology negative hematology ROS (+) Blood dyscrasia, anemia   Anesthesia Other Findings Past Medical History: No date: Anemia No date: Anxiety No date: Cancer (HCC) No date: Constipation No date: Depression No date: Diabetes mellitus without complication (HCC) No date: Edema, lower extremity No date: GERD (gastroesophageal reflux disease) No date: Hypertension No date: Joint pain No date: Lactose intolerance No date: Lupus No date: Osteoarthritis No date: Sleep apnea No date: Squamous cell carcinoma No date: Stroke [facility])     Comment:  Stroke in Jan-2020 / TIA -April 2020  Past Surgical History: No date: ABDOMINAL HYSTERECTOMY     Comment:  approx 5 years ago from 2017  No date: CHOLECYSTECTOMY No date: WISDOM TOOTH EXTRACTION  BMI    Body Mass Index: 29.10 kg/m      Reproductive/Obstetrics negative OB ROS                              Anesthesia Physical Anesthesia Plan  ASA: 3  Anesthesia Plan:  General   Post-op Pain Management:    Induction: Intravenous  PONV Risk Score and Plan: Propofol  infusion and TIVA  Airway Management Planned: Natural Airway and Nasal Cannula  Additional Equipment:   Intra-op Plan:   Post-operative Plan:   Informed Consent: I have reviewed the patients History and Physical, chart, labs and discussed the procedure including the risks, benefits and alternatives for the proposed anesthesia with the patient or authorized representative who has indicated his/her understanding and acceptance.     Dental Advisory Given  Plan Discussed with: CRNA  Anesthesia Plan Comments:         Anesthesia Quick Evaluation

## 2024-07-31 NOTE — Op Note (Signed)
 [facility] Patient Name: Natalie Greene Procedure Date: 07/31/2024 1:55 PM MRN: [id number] Account #: [id number] Date of Birth: Apr 26, 1971 Admit Type: Outpatient Age: 53 Room: [facility] ENDO ROOM 3 Gender: Female Note Status: Finalized Instrument Name: Colon Scope [id number] Procedure:             Colonoscopy Indications:           Screening in patient at increased risk: Family history                         of 1st-degree relative with colorectal cancer before                         age 79 years Providers:             Ole Schick MD, MD Referring MD:          Reyes BIRCH. Auston, MD (Referring MD) Medicines:             Monitored Anesthesia Care Complications:         No immediate complications. Procedure:             Pre-Anesthesia Assessment:                        - Prior to the procedure, a History and Physical was                         performed, and patient medications and allergies were                         reviewed. The patient is competent. The risks and                         benefits of the procedure and the sedation options and                         risks were discussed with the patient. All questions                         were answered and informed consent was obtained.                         Patient identification and proposed procedure were                         verified by the physician, the nurse, the                         anesthesiologist, the anesthetist and the technician                         in the endoscopy suite. Mental Status Examination:                         alert and oriented. Airway Examination: normal                         oropharyngeal airway and neck mobility. Respiratory  Examination: clear to auscultation. CV Examination:                         normal. Prophylactic Antibiotics: The patient does not                         require prophylactic antibiotics. Prior                          Anticoagulants: The patient has taken no anticoagulant                         or antiplatelet agents. ASA Grade Assessment: III - A                         patient with severe systemic disease. After reviewing                         the risks and benefits, the patient was deemed in                         satisfactory condition to undergo the procedure. The                         anesthesia plan was to use monitored anesthesia care                         (MAC). Immediately prior to administration of                         medications, the patient was re-assessed for adequacy                         to receive sedatives. The heart rate, respiratory                         rate, oxygen saturations, blood pressure, adequacy of                         pulmonary ventilation, and response to care were                         monitored throughout the procedure. The physical                         status of the patient was re-assessed after the                         procedure.                        After obtaining informed consent, the colonoscope was                         passed under direct vision. Throughout the procedure,                         the patient's blood pressure, pulse, and oxygen  saturations were monitored continuously. The                         Colonoscope was introduced through the anus and                         advanced to the the terminal ileum, with                         identification of the appendiceal orifice and IC                         valve. The colonoscopy was performed without                         difficulty. The patient tolerated the procedure well.                         The quality of the bowel preparation was good. The                         terminal ileum, ileocecal valve, appendiceal orifice,                         and rectum were photographed. Findings:      The perianal and digital rectal  examinations were normal.      The terminal ileum appeared normal.      A few small-mouthed diverticula were found in the ascending colon.      Multiple medium-mouthed and small-mouthed diverticula were found in the       sigmoid colon and descending colon.      Internal hemorrhoids were found during retroflexion. The hemorrhoids       were Grade I (internal hemorrhoids that do not prolapse).      The exam was otherwise without abnormality on direct and retroflexion       views. Impression:            - The examined portion of the ileum was normal.                        - Diverticulosis in the ascending colon.                        - Diverticulosis in the sigmoid colon and in the                         descending colon.                        - Internal hemorrhoids.                        - The examination was otherwise normal on direct and                         retroflexion views.                        - No specimens collected. Recommendation:        - Discharge patient to home.                        -  Resume previous diet.                        - Continue present medications.                        - Repeat colonoscopy in 5 years for screening purposes.                        - Return to referring physician as previously                         scheduled. Procedure Code(s):     --- Professional ---                        H9894, Colorectal cancer screening; colonoscopy on                         individual at high risk Diagnosis Code(s):     --- Professional ---                        Z80.0, Family history of malignant neoplasm of                         digestive organs                        K64.0, First degree hemorrhoids                        K57.30, Diverticulosis of large intestine without                         perforation or abscess without bleeding CPT copyright 2022 [employer]. All rights reserved. The codes documented in this report are  preliminary and upon coder review may  be revised to meet current compliance requirements. Ole Schick MD, MD 07/31/2024 2:30:09 PM Number of Addenda: 0 Note Initiated On: 07/31/2024 1:55 PM Scope Withdrawal Time: 0 hours 9 minutes 33 seconds  Total Procedure Duration: 0 hours 13 minutes 17 seconds  Estimated Blood Loss:  Estimated blood loss: none.      [facility]

## 2024-09-30 ENCOUNTER — Other Ambulatory Visit: Payer: Self-pay | Admitting: Internal Medicine

## 2024-09-30 DIAGNOSIS — I729 Aneurysm of unspecified site: Secondary | ICD-10-CM

## 2024-10-06 ENCOUNTER — Ambulatory Visit
Admission: RE | Admit: 2024-10-06 | Discharge: 2024-10-06 | Disposition: A | Discharge status: Home / Self Care | Source: Ambulatory Visit | Attending: Internal Medicine | Admitting: Internal Medicine

## 2024-10-06 DIAGNOSIS — I729 Aneurysm of unspecified site: Secondary | ICD-10-CM | POA: Diagnosis present

## 2024-10-06 DIAGNOSIS — R930 Abnormal findings on diagnostic imaging of skull and head, not elsewhere classified: Secondary | ICD-10-CM | POA: Insufficient documentation

## 2024-10-20 ENCOUNTER — Ambulatory Visit: Admitting: Plastic Surgery

## 2024-10-20 ENCOUNTER — Encounter: Payer: Self-pay | Admitting: Plastic Surgery

## 2024-10-20 DIAGNOSIS — M793 Panniculitis, unspecified: Secondary | ICD-10-CM | POA: Insufficient documentation

## 2024-10-20 DIAGNOSIS — G8929 Other chronic pain: Secondary | ICD-10-CM

## 2024-10-20 DIAGNOSIS — M542 Cervicalgia: Secondary | ICD-10-CM | POA: Diagnosis not present

## 2024-10-20 DIAGNOSIS — M549 Dorsalgia, unspecified: Secondary | ICD-10-CM | POA: Diagnosis not present

## 2024-10-20 NOTE — Progress Notes (Signed)
 Patient ID: Natalie Greene, female    DOB: 04-13-1971, 53 y.o.   MRN: [id number]   Chief Complaint  Patient presents with   Advice Only    Consult Poss pann pt stated she lost about 90 lbs//Pain syndrome, chronic Class 1 obesity due to excess calories with serious comorbidity and body mass index (BMI) of 30.0 to 30.9      The patient is a 53 year old female here for evaluation of her abdomen.  She is 5 feet 1 inch tall and weighs 160 pounds.  She has lost significant weight while being on Mounjaro.  The patient states her weight loss has slowed down some.  She is going to talk with her doctor and see if she can make some adjustments.  Now that she has lost weight she has a pannus.  She is getting some skin irritation in her skin folds as well as back and neck pain.  This seems to be getting worse with the weight loss.  No sign of a hernia.  Her past medical history includes depression, anxiety, history of cancer, squamous cell carcinoma, history of stroke and hypertension.  Past surgical history includes a cholecystectomy, C-section, hysterectomy and blepharoplasty.  She did not have any complications from her surgeries.    Review of Systems  Constitutional: Negative.   HENT: Negative.    Eyes: Negative.   Respiratory: Negative.    Cardiovascular: Negative.   Gastrointestinal: Negative.   Endocrine: Negative.   Genitourinary: Negative.   Musculoskeletal: Negative.     Past Medical History:  Diagnosis Date   Anemia    Anxiety    Cancer (HCC)    Constipation    Depression    Diabetes mellitus without complication (HCC)    Edema, lower extremity    GERD (gastroesophageal reflux disease)    Hypertension    Joint pain    Lactose intolerance    Lupus    Osteoarthritis    Sleep apnea    Squamous cell carcinoma    Stroke [facility]    Stroke in Jan-2020 / TIA -April 2020    Past Surgical History:  Procedure Laterality Date   ABDOMINAL HYSTERECTOMY     approx 5 years ago from  2017    CHOLECYSTECTOMY     COLONOSCOPY N/A 07/31/2024   Procedure: COLONOSCOPY;  Surgeon: Maryruth Ole DASEN, MD;  Location: [facility] ENDOSCOPY;  Service: Endoscopy;  Laterality: N/A;   WISDOM TOOTH EXTRACTION        Current Outpatient Medications:    ALPRAZolam  (XANAX ) 1 MG tablet, TAKE ONE TABLET BY MOUTH AT BEDTIME AS NEEDED FOR ANXIETY (Patient taking differently: Take 1 mg by mouth at bedtime as needed for sleep.), Disp: 30 tablet, Rfl: 2   Ascorbic Acid  (VITAMIN C  PO), Take 1 tablet by mouth daily., Disp: , Rfl:    atorvastatin  (LIPITOR) 20 MG tablet, Take 1 tablet (20 mg total) by mouth daily., Disp: 30 tablet, Rfl: 2   celecoxib (CELEBREX) 200 MG capsule, Take 200 mg by mouth daily., Disp: , Rfl:    cyclobenzaprine  (FLEXERIL ) 10 MG tablet, Take 1 tablet (10 mg total) by mouth 3 (three) times daily as needed for muscle spasms., Disp: 15 tablet, Rfl: 0   Diclofenac Sodium  (PENNSAID ) 2 % SOLN, Apply 2 g topically 2 (two) times daily as needed (to affected area)., Disp: 112 g, Rfl: 3   escitalopram  (LEXAPRO ) 10 MG tablet, Take 10 mg by mouth daily., Disp: , Rfl:    esomeprazole  (  NEXIUM ) 40 MG capsule, Take 1 capsule (40 mg total) by mouth daily at 12 noon., Disp: 90 capsule, Rfl: 1   furosemide  (LASIX ) 20 MG tablet, Take 20 mg by mouth daily., Disp: , Rfl:    ibuprofen  (ADVIL ) 600 MG tablet, Take 1 tablet (600 mg total) by mouth every 6 (six) hours as needed., Disp: 30 tablet, Rfl: 0   liraglutide (VICTOZA) 18 MG/3ML SOPN, Inject 1.2 mg into the skin daily., Disp: , Rfl:    lisinopril  (PRINIVIL ,ZESTRIL ) 5 MG tablet, Take 1 tablet (5 mg total) by mouth daily., Disp: 30 tablet, Rfl: 2   meloxicam  (MOBIC ) 15 MG tablet, Take 1 tablet (15 mg total) by mouth daily., Disp: 30 tablet, Rfl: 0   minoxidil  (LONITEN ) 2.5 MG tablet, Take 0.5 tablets (1.25 mg total) by mouth daily., Disp: 15 tablet, Rfl: 5   oxyCODONE -acetaminophen  (PERCOCET) 10-325 MG tablet, Take 1 tablet by mouth every 4 (four) hours as  needed for pain., Disp: , Rfl:    Tirzepatide (MOUNJARO [place]), Inject into the skin., Disp: , Rfl:    valACYclovir  (VALTREX ) 500 MG tablet, TAKE 1 TABLET (500 MG TOTAL) BY MOUTH 2 (TWO) TIMES DAILY. (Patient taking differently: Take 500 mg by mouth 2 (two) times daily as needed (for fever blisters).), Disp: 30 tablet, Rfl: 2   VITAMIN E PO, Take 1 capsule by mouth daily., Disp: , Rfl:    aspirin  81 MG chewable tablet, Chew 81 mg by mouth daily. (Patient not taking: Reported on 10/20/2024), Disp: , Rfl:    Objective:   Vitals:   10/20/24 0954  BP: 129/79  Pulse: 74  SpO2: 100%    Physical Exam Vitals reviewed.  Constitutional:      Appearance: Normal appearance.  HENT:     Head: Atraumatic.  Cardiovascular:     Rate and Rhythm: Normal rate.  Pulmonary:     Effort: Pulmonary effort is normal.  Abdominal:     General: There is no distension.     Palpations: Abdomen is soft. There is no mass.     Tenderness: There is no abdominal tenderness.     Hernia: No hernia is present.  Skin:    General: Skin is warm.     Capillary Refill: Capillary refill takes less than 2 seconds.  Neurological:     Mental Status: She is alert and oriented to person, place, and time.  Psychiatric:        Mood and Affect: Mood normal.        Behavior: Behavior normal.        Thought Content: Thought content normal.        Judgment: Judgment normal.     Assessment & Plan:  Morbid obesity (HCC)  Panniculitis  Chronic bilateral thoracic back pain  The patient has a good chance to get this covered by insurance.  She is gena work on weight reduction and then see us  back in the next few months.  I also encouraged her to take pictures of any skin breakdown.  Pictures were obtained of the patient and placed in the chart with the patient's or guardian's permission.   Estefana RAMAN Jakhia Buxton, DO

## 2024-12-18 ENCOUNTER — Ambulatory Visit: Admitting: Plastic Surgery

## 2025-01-01 ENCOUNTER — Ambulatory Visit: Admitting: Plastic Surgery

## 2025-01-01 ENCOUNTER — Encounter: Payer: Self-pay | Admitting: Plastic Surgery

## 2025-01-01 VITALS — Wt 159.0 lb

## 2025-01-01 DIAGNOSIS — M546 Pain in thoracic spine: Secondary | ICD-10-CM | POA: Diagnosis not present

## 2025-01-01 DIAGNOSIS — M793 Panniculitis, unspecified: Secondary | ICD-10-CM | POA: Diagnosis not present

## 2025-01-01 DIAGNOSIS — G8929 Other chronic pain: Secondary | ICD-10-CM

## 2025-01-01 NOTE — Progress Notes (Signed)
" ° °  Subjective:    Patient ID: Natalie Greene, female    DOB: 1971/10/02, 54 y.o.   MRN: [id number]  The patient is a 54 year old female here for further evaluation of her abdomen.  She was last seen in November with panniculitis.  She had started on Mounjaro and was trying to decrease her weight.  She is also on phentermine .  She does not have any sign of a hernia.  Unfortunately she is still 160 pounds.  She has not been able to decrease her weight.  We offered healthy weight and wellness consult.  She is going to think it over.  She is gena continue to work with her primary care doc.      Review of Systems  Constitutional:  Positive for activity change. Negative for appetite change.  Eyes: Negative.   Respiratory: Negative.    Cardiovascular: Negative.   Gastrointestinal: Negative.   Endocrine: Negative.   Genitourinary: Negative.   Musculoskeletal:  Positive for back pain and neck pain.  Skin:  Positive for wound.       Objective:   Physical Exam Vitals reviewed.  Constitutional:      Appearance: Normal appearance.  HENT:     Head: Atraumatic.  Cardiovascular:     Rate and Rhythm: Normal rate.     Pulses: Normal pulses.  Pulmonary:     Effort: Pulmonary effort is normal.  Abdominal:     General: There is no distension.     Tenderness: There is no abdominal tenderness.  Musculoskeletal:        General: No swelling or deformity.  Skin:    General: Skin is warm.     Capillary Refill: Capillary refill takes less than 2 seconds.     Coloration: Skin is not jaundiced.  Neurological:     Mental Status: She is alert and oriented to person, place, and time.  Psychiatric:        Mood and Affect: Mood normal.        Behavior: Behavior normal.        Thought Content: Thought content normal.          Assessment & Plan:     ICD-10-CM   1. Chronic bilateral thoracic back pain  M54.6    G89.29     2. Panniculitis  M79.3        Patient is good to see if she can  give it another 3 months and decrease her weight.  I think she would do really well with panniculectomy if we can get her weight reduced some.  Will plan to see her back in follow-up. "

## 2025-01-09 ENCOUNTER — Other Ambulatory Visit: Payer: Self-pay | Admitting: Dermatology

## 2025-01-09 DIAGNOSIS — L650 Telogen effluvium: Secondary | ICD-10-CM

## 2025-01-20 ENCOUNTER — Other Ambulatory Visit: Payer: Self-pay | Admitting: Internal Medicine

## 2025-01-20 DIAGNOSIS — Z1231 Encounter for screening mammogram for malignant neoplasm of breast: Secondary | ICD-10-CM

## 2025-01-22 ENCOUNTER — Other Ambulatory Visit: Payer: Self-pay | Admitting: Dermatology

## 2025-01-22 DIAGNOSIS — L650 Telogen effluvium: Secondary | ICD-10-CM

## 2025-01-26 ENCOUNTER — Encounter

## 2025-01-28 ENCOUNTER — Ambulatory Visit: Admitting: Dermatology

## 2025-04-06 ENCOUNTER — Ambulatory Visit: Admitting: Plastic Surgery
